# Patient Record
Sex: Male | Born: 1980 | Race: White | Hispanic: No | Marital: Married | Smoking: Never smoker
Health system: Southern US, Community
[De-identification: ages and names within clinical notes are randomized; demographics above are authoritative.]

## PROBLEM LIST (undated history)

## (undated) DIAGNOSIS — E559 Vitamin D deficiency, unspecified: Secondary | ICD-10-CM

## (undated) DIAGNOSIS — F988 Other specified behavioral and emotional disorders with onset usually occurring in childhood and adolescence: Secondary | ICD-10-CM

## (undated) DIAGNOSIS — F32A Depression, unspecified: Secondary | ICD-10-CM

## (undated) DIAGNOSIS — F419 Anxiety disorder, unspecified: Secondary | ICD-10-CM

## (undated) DIAGNOSIS — I10 Essential (primary) hypertension: Secondary | ICD-10-CM

## (undated) DIAGNOSIS — T7840XA Allergy, unspecified, initial encounter: Secondary | ICD-10-CM

## (undated) DIAGNOSIS — G4730 Sleep apnea, unspecified: Secondary | ICD-10-CM

## (undated) DIAGNOSIS — F329 Major depressive disorder, single episode, unspecified: Secondary | ICD-10-CM

## (undated) DIAGNOSIS — E88819 Insulin resistance, unspecified: Secondary | ICD-10-CM

## (undated) DIAGNOSIS — E8881 Metabolic syndrome: Secondary | ICD-10-CM

## (undated) DIAGNOSIS — E785 Hyperlipidemia, unspecified: Secondary | ICD-10-CM

## (undated) DIAGNOSIS — J45909 Unspecified asthma, uncomplicated: Secondary | ICD-10-CM

## (undated) HISTORY — DX: Sleep apnea, unspecified: G47.30

## (undated) HISTORY — PX: OPEN REDUCTION INTERNAL FIXATION (ORIF) METACARPAL: SHX6234

## (undated) HISTORY — PX: PALATOPLASTY: SHX2153

## (undated) HISTORY — DX: Hyperlipidemia, unspecified: E78.5

## (undated) HISTORY — DX: Essential (primary) hypertension: I10

## (undated) HISTORY — PX: TONSILLECTOMY AND ADENOIDECTOMY: SUR1326

## (undated) HISTORY — DX: Allergy, unspecified, initial encounter: T78.40XA

## (undated) HISTORY — PX: ORIF HUMERUS FRACTURE: SHX2126

## (undated) HISTORY — DX: Insulin resistance, unspecified: E88.819

## (undated) HISTORY — DX: Vitamin D deficiency, unspecified: E55.9

## (undated) HISTORY — PX: OTHER SURGICAL HISTORY: SHX169

## (undated) HISTORY — DX: Major depressive disorder, single episode, unspecified: F32.9

## (undated) HISTORY — DX: Unspecified asthma, uncomplicated: J45.909

## (undated) HISTORY — DX: Other specified behavioral and emotional disorders with onset usually occurring in childhood and adolescence: F98.8

## (undated) HISTORY — DX: Depression, unspecified: F32.A

## (undated) HISTORY — DX: Anxiety disorder, unspecified: F41.9

## (undated) HISTORY — DX: Metabolic syndrome: E88.81

---
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
## 2001-04-25 HISTORY — PX: HERNIA REPAIR: SHX51

## 2001-04-25 HISTORY — PX: INGUINAL HERNIA REPAIR: SHX194

## 2007-08-30 ENCOUNTER — Encounter: Admission: RE | Admit: 2007-08-30 | Discharge: 2007-08-30 | Payer: Self-pay | Admitting: Family Medicine

## 2007-09-14 ENCOUNTER — Encounter: Admission: RE | Admit: 2007-09-14 | Discharge: 2007-09-14 | Payer: Self-pay | Admitting: Gastroenterology

## 2008-01-04 ENCOUNTER — Ambulatory Visit (HOSPITAL_BASED_OUTPATIENT_CLINIC_OR_DEPARTMENT_OTHER): Admission: RE | Admit: 2008-01-04 | Discharge: 2008-01-04 | Payer: Self-pay | Admitting: *Deleted

## 2008-06-06 ENCOUNTER — Ambulatory Visit (HOSPITAL_COMMUNITY): Admission: RE | Admit: 2008-06-06 | Discharge: 2008-06-06 | Payer: Self-pay | Admitting: Family Medicine

## 2010-05-16 ENCOUNTER — Encounter: Payer: Self-pay | Admitting: Family Medicine

## 2010-09-07 NOTE — Op Note (Signed)
NAMEDEMANI, MCBRIEN NO.:  [id number]   MEDICAL RECORD NO.:  [id number]          PATIENT TYPE:  AMB   LOCATION:  DSC                          FACILITY:  [facility]   PHYSICIAN:  Tennis Must Meyerdierks, M.D.DATE OF BIRTH:  March 23, 1981   DATE OF PROCEDURE:  01/04/2008  DATE OF DISCHARGE:                               OPERATIVE REPORT   PREOPERATIVE DIAGNOSIS:  Fracture right first metacarpal proximal third.   POSTOPERATIVE DIAGNOSIS:  Fracture right first metacarpal proximal  third.   PROCEDURE:  Closed reduction and percutaneous pinning fracture right  first metacarpal.   SURGEON:  Lowell Bouton, MD   ANESTHESIA:  General.   OPERATIVE FINDINGS:  The patient had an oblique fracture of the first  metacarpal that was closed in the proximal third.  It was volarly  angulated.   DESCRIPTION OF PROCEDURE:  Under general anesthesia, the right hand was  prepped and draped in the usual fashion and under fluoroscopic control,  a closed reduction was performed of the first metacarpal.  A 0.045 K-  wire was placed obliquely across the fracture and x-rays showed good  alignment.  A second 0.045 K-wire was then placed through the dorsum of  the MP joint longitudinally across the fracture site to the [facility] joint.  X-rays showed good position of the fracture and fluoroscopically there  was no motion at the fracture site.  The pins were bent over and left  protruding from the skin.  Sterile dressings were applied followed by a  thumb spica splint.  Marcaine 0.50% was inserted for pain control.  The  patient tolerated the procedure well and went to the recovery room  awake, stable and good condition.      Lowell Bouton, M.D.  Electronically Signed     EMM/MEDQ  D:  01/04/2008  T:  01/05/2008  Job:  [id number]

## 2011-01-26 LAB — POCT HEMOGLOBIN-HEMACUE: Hemoglobin: 16.4

## 2013-03-14 ENCOUNTER — Encounter: Payer: Self-pay | Admitting: Physician Assistant

## 2013-03-14 DIAGNOSIS — F324 Major depressive disorder, single episode, in partial remission: Secondary | ICD-10-CM | POA: Insufficient documentation

## 2013-03-14 DIAGNOSIS — J45909 Unspecified asthma, uncomplicated: Secondary | ICD-10-CM | POA: Insufficient documentation

## 2013-03-14 DIAGNOSIS — E559 Vitamin D deficiency, unspecified: Secondary | ICD-10-CM | POA: Insufficient documentation

## 2013-03-14 DIAGNOSIS — R0989 Other specified symptoms and signs involving the circulatory and respiratory systems: Secondary | ICD-10-CM | POA: Insufficient documentation

## 2013-03-14 DIAGNOSIS — J4520 Mild intermittent asthma, uncomplicated: Secondary | ICD-10-CM | POA: Insufficient documentation

## 2013-03-14 DIAGNOSIS — E785 Hyperlipidemia, unspecified: Secondary | ICD-10-CM

## 2013-03-14 DIAGNOSIS — F329 Major depressive disorder, single episode, unspecified: Secondary | ICD-10-CM

## 2013-03-14 DIAGNOSIS — F419 Anxiety disorder, unspecified: Secondary | ICD-10-CM

## 2013-03-14 DIAGNOSIS — E8881 Metabolic syndrome: Secondary | ICD-10-CM

## 2013-03-14 DIAGNOSIS — I10 Essential (primary) hypertension: Secondary | ICD-10-CM

## 2013-03-14 DIAGNOSIS — E782 Mixed hyperlipidemia: Secondary | ICD-10-CM | POA: Insufficient documentation

## 2013-03-14 DIAGNOSIS — F988 Other specified behavioral and emotional disorders with onset usually occurring in childhood and adolescence: Secondary | ICD-10-CM | POA: Insufficient documentation

## 2013-03-15 ENCOUNTER — Ambulatory Visit: Payer: Self-pay | Admitting: Physician Assistant

## 2013-04-03 ENCOUNTER — Other Ambulatory Visit: Payer: Self-pay | Admitting: Internal Medicine

## 2013-04-03 MED ORDER — AMPHETAMINE-DEXTROAMPHETAMINE 20 MG PO TABS: ORAL_TABLET | ORAL | Status: DC

## 2013-06-13 ENCOUNTER — Encounter: Payer: Self-pay | Admitting: Internal Medicine

## 2013-06-13 ENCOUNTER — Ambulatory Visit (INDEPENDENT_AMBULATORY_CARE_PROVIDER_SITE_OTHER): Payer: BC Managed Care – PPO | Admitting: Internal Medicine

## 2013-06-13 VITALS — BP 130/86 | HR 60 | Temp 97.9°F | Resp 16 | Ht 69.75 in | Wt 215.4 lb

## 2013-06-13 DIAGNOSIS — Z79899 Other long term (current) drug therapy: Secondary | ICD-10-CM

## 2013-06-13 DIAGNOSIS — E8881 Metabolic syndrome: Secondary | ICD-10-CM

## 2013-06-13 DIAGNOSIS — E785 Hyperlipidemia, unspecified: Secondary | ICD-10-CM

## 2013-06-13 DIAGNOSIS — Z23 Encounter for immunization: Secondary | ICD-10-CM

## 2013-06-13 DIAGNOSIS — Z113 Encounter for screening for infections with a predominantly sexual mode of transmission: Secondary | ICD-10-CM

## 2013-06-13 DIAGNOSIS — Z0000 Encounter for general adult medical examination without abnormal findings: Secondary | ICD-10-CM

## 2013-06-13 DIAGNOSIS — Z1212 Encounter for screening for malignant neoplasm of rectum: Secondary | ICD-10-CM

## 2013-06-13 DIAGNOSIS — R7402 Elevation of levels of lactic acid dehydrogenase (LDH): Secondary | ICD-10-CM

## 2013-06-13 DIAGNOSIS — R740 Nonspecific elevation of levels of transaminase and lactic acid dehydrogenase [LDH]: Secondary | ICD-10-CM

## 2013-06-13 DIAGNOSIS — Z111 Encounter for screening for respiratory tuberculosis: Secondary | ICD-10-CM

## 2013-06-13 DIAGNOSIS — I10 Essential (primary) hypertension: Secondary | ICD-10-CM

## 2013-06-13 DIAGNOSIS — E559 Vitamin D deficiency, unspecified: Secondary | ICD-10-CM

## 2013-06-13 LAB — CBC WITH DIFFERENTIAL/PLATELET
Basophils Absolute: 0 10*3/uL (ref 0.0–0.1)
Basophils Relative: 0 % (ref 0–1)
EOS ABS: 0.3 10*3/uL (ref 0.0–0.7)
EOS PCT: 6 % — AB (ref 0–5)
HEMATOCRIT: 42 % (ref 39.0–52.0)
Hemoglobin: 14.3 g/dL (ref 13.0–17.0)
LYMPHS ABS: 2.3 10*3/uL (ref 0.7–4.0)
LYMPHS PCT: 39 % (ref 12–46)
MCH: 28.8 pg (ref 26.0–34.0)
MCHC: 34 g/dL (ref 30.0–36.0)
MCV: 84.5 fL (ref 78.0–100.0)
MONO ABS: 0.4 10*3/uL (ref 0.1–1.0)
Monocytes Relative: 7 % (ref 3–12)
Neutro Abs: 2.8 10*3/uL (ref 1.7–7.7)
Neutrophils Relative %: 48 % (ref 43–77)
Platelets: 301 10*3/uL (ref 150–400)
RBC: 4.97 MIL/uL (ref 4.22–5.81)
RDW: 13 % (ref 11.5–15.5)
WBC: 5.8 10*3/uL (ref 4.0–10.5)

## 2013-06-13 NOTE — Progress Notes (Signed)
Patient ID: Cole Lozano, male   DOB: [birth year], 33 y.o.   MRN: [id number]   Annual Screening Comprehensive Examination  This very nice 32 y.o.  MWM presents for complete physical.  Patient has been followed for Labile elevated BP, Diabetes  Prediabetes, Hyperlipidemia, and Vitamin D Deficiency.   HTN predates since August 2014 with BP 140/97. Patient's BP has been controlled at home usu less than 120/70-80's. Today's BP: 130/86 mmHg. Patient denies any cardiac symptoms as chest pain, palpitations, shortness of breath, dizziness or ankle swelling.   [name] is controlled with diet and medications. Patient denies myalgias or other medication SE's. Last cholesterol last visit was 199, triglycerides 116, HDL 39  and LDL 137 elevated in Aug 2014.     Patient has prediabetes/insulin resistance with last A1c 5.2% / insulin elevated at 94 in Aug 2014.Marland Kitchen Since that time, he has lost about 10 #. Patient denies reactive hypoglycemic symptoms, visual blurring, diabetic polys, or paresthesias.    Patient has ADD and is currently on Wellbutrin (also for mood) and Adderall with reported improved concentration and productivity.   Finally, patient has history of Vitamin D Deficiency with last vitamin D 52 in Oct 2014. He also has Hx/o low testosterone 294 in August 2014.  Medication Sig Dispense Refill  . albuterol (PROVENTIL HFA;VENTOLIN HFA) 108 (90 BASE) MCG/ACT inhaler Inhale into the lungs every 6 (six) hours as needed for wheezing or shortness of breath.      . amphetamine-dextroamphetamine (ADDERALL) 20 MG tablet 1/2 to 1 tablet 2 or 3 x daily for ADD  90 tablet  0   No current facility-administered medications on file prior to visit.   No Known Allergies  Past Medical History  Diagnosis Date  . Hypertension, Labile   . Allergy   . Asthma   . Anxiety   . Depression   . Vitamin D deficiency   . ADD (attention deficit disorder)   . Hyperlipidemia   . Insulin resistance      history of normal A1C but insulin 60, 46    Past Surgical History  Procedure Laterality Date  . Elbow surgery Left 1991    s/p trauma  . Tonsillectomy and adenoidectomy    . Hernia repair Right 2003    inguinal    Family History  Problem Relation Age of Onset  . Cancer Mother     skin  . Neurofibromatosis Brother   . Diabetes Other   . Stroke Other     History   Social History  . Marital Status: Single    Spouse Name: N/A    Number of Children: N/A  . Years of Education: N/A   Occupational History  . Not on file.   Social History Main Topics  . Smoking status: Never Smoker   . Smokeless tobacco: Never Used  . Alcohol Use: 0.5 oz/week    1 drink(s) per week  . Drug Use: No  . Sexual Activity: Yes    ROS Constitutional: Denies fever, chills, weight loss/gain, headaches, insomnia, fatigue, night sweats, and change in appetite. Eyes: Denies redness, blurred vision, diplopia, discharge, itchy, watery eyes.  ENT: Denies discharge, congestion, post nasal drip, epistaxis, sore throat, earache, hearing loss, dental pain, Tinnitus, Vertigo, Sinus pain, snoring.  Cardio: Denies chest pain, palpitations, irregular heartbeat, syncope, dyspnea, diaphoresis, orthopnea, PND, claudication, edema Respiratory: denies cough, dyspnea, DOE, pleurisy, hoarseness, laryngitis, wheezing.  Gastrointestinal: Denies dysphagia, heartburn, reflux, water brash, pain, cramps, nausea, vomiting, bloating, diarrhea, constipation, hematemesis, melena,  hematochezia, jaundice, hemorrhoids Genitourinary: Denies dysuria, frequency, urgency, nocturia, hesitancy, discharge, hematuria, flank pain Musculoskeletal: Denies arthralgia, myalgia, stiffness, Jt. Swelling, pain, limp, and strain/sprain. Skin: Denies puritis, rash, hives, warts, acne, eczema, changing in skin lesion Neuro: No weakness, tremor, incoordination, spasms, paresthesia, pain Psychiatric: Denies confusion, memory loss, sensory  loss Endocrine: Denies change in weight, skin, hair change, nocturia, and paresthesia, diabetic polys, visual blurring, hyper / hypo glycemic episodes.  Heme/Lymph: No excessive bleeding, bruising, or elarged lymph nodes.  BP: 130/86  Pulse: 60  Temp: 97.9 F (36.6 C)  Resp: 16    Estimated body mass index is 31.12 kg/(m^2) as calculated from the following:   Height as of this encounter: 5' 9.75" (1.772 m).   Weight as of this encounter: 215 lb 6.4 oz (97.705 kg).  Physical Exam General Appearance: Well nourished, in no apparent distress. Eyes: PERRLA, EOMs, conjunctiva no swelling or erythema, normal fundi and vessels. Sinuses: No frontal/maxillary tenderness ENT/Mouth: EACs patent / TMs  nl. Nares clear without erythema, swelling, mucoid exudates. Oral hygiene is good. No erythema, swelling, or exudate. Tongue normal, non-obstructing. Tonsils not swollen or erythematous. Hearing normal.  Neck: Supple, thyroid normal. No bruits, nodes or JVD. Respiratory: Respiratory effort normal.  BS equal and clear bilateral without rales, rhonci, wheezing or stridor. Cardio: Heart sounds are normal with regular rate and rhythm and no murmurs, rubs or gallops. Peripheral pulses are normal and equal bilaterally without edema. No aortic or femoral bruits. Chest: symmetric with normal excursions and percussion.  Abdomen: Flat, soft, with bowl sounds. Nontender, no guarding, rebound, hernias, masses, or organomegaly.  Lymphatics: Non tender without lymphadenopathy.  Genitourinary: No hernias.Testes nl.  Musculoskeletal: Full ROM all peripheral extremities, joint stability, 5/5 strength, and normal gait. Skin: Warm and dry without rashes, lesions, cyanosis, clubbing or  ecchymosis.  Neuro: Cranial nerves intact, reflexes equal bilaterally. Normal muscle tone, no cerebellar symptoms. Sensation intact.  Pysch: Awake and oriented X 3, normal affect, insight and judgment appropriate.   Assessment and  Plan  1. Annual Screening Examination 2. Hypertension, labile  3. Hyperlipidemia 4. Pre Diabetes / Insulin Resistance 5. Vitamin D Deficiency 6. ADD  Continue prudent diet as discussed, weight control, BP monitoring, regular exercise, and medications as discussed.  Discussed med effects and SE's. Routine screening labs and tests as requested with regular follow-up as recommended.

## 2013-06-13 NOTE — Patient Instructions (Signed)

## 2013-06-14 LAB — VITAMIN B12: VITAMIN B 12: 779 pg/mL (ref 211–911)

## 2013-06-14 LAB — TESTOSTERONE: Testosterone: 441 ng/dL (ref 300–890)

## 2013-06-14 LAB — URINALYSIS, MICROSCOPIC ONLY
BACTERIA UA: NONE SEEN
CRYSTALS: NONE SEEN
Casts: NONE SEEN
Squamous Epithelial / LPF: NONE SEEN

## 2013-06-14 LAB — HEPATIC FUNCTION PANEL
ALBUMIN: 5.3 g/dL — AB (ref 3.5–5.2)
ALK PHOS: 54 U/L (ref 39–117)
ALT: 26 U/L (ref 0–53)
AST: 25 U/L (ref 0–37)
Bilirubin, Direct: 0.1 mg/dL (ref 0.0–0.3)
Indirect Bilirubin: 0.6 mg/dL (ref 0.2–1.2)
TOTAL PROTEIN: 7.6 g/dL (ref 6.0–8.3)
Total Bilirubin: 0.7 mg/dL (ref 0.2–1.2)

## 2013-06-14 LAB — VITAMIN D 25 HYDROXY (VIT D DEFICIENCY, FRACTURES): Vit D, 25-Hydroxy: 92 ng/mL — ABNORMAL HIGH (ref 30–89)

## 2013-06-14 LAB — LIPID PANEL
Cholesterol: 219 mg/dL — ABNORMAL HIGH (ref 0–200)
HDL: 39 mg/dL — AB (ref >39)
LDL CALC: 152 mg/dL — AB (ref 0–99)
Total CHOL/HDL Ratio: 5.6 Ratio
Triglycerides: 142 mg/dL (ref <150)
VLDL: 28 mg/dL (ref 0–40)

## 2013-06-14 LAB — MICROALBUMIN / CREATININE URINE RATIO
Creatinine, Urine: 39.3 mg/dL
MICROALB UR: 0.5 mg/dL (ref 0.00–1.89)
MICROALB/CREAT RATIO: 12.7 mg/g (ref 0.0–30.0)

## 2013-06-14 LAB — BASIC METABOLIC PANEL WITH GFR
BUN: 8 mg/dL (ref 6–23)
CALCIUM: 10.2 mg/dL (ref 8.4–10.5)
CHLORIDE: 101 meq/L (ref 96–112)
CO2: 25 meq/L (ref 19–32)
CREATININE: 0.83 mg/dL (ref 0.50–1.35)
GFR, Est African American: >89 mL/min
GFR, Est Non African American: >89 mL/min
Glucose, Bld: 83 mg/dL (ref 70–99)
Potassium: 4.3 mEq/L (ref 3.5–5.3)
Sodium: 140 mEq/L (ref 135–145)

## 2013-06-14 LAB — TSH: TSH: 1.843 u[IU]/mL (ref 0.350–4.500)

## 2013-06-14 LAB — RPR

## 2013-06-14 LAB — HEMOGLOBIN A1C
HEMOGLOBIN A1C: 5.3 % (ref <5.7)
MEAN PLASMA GLUCOSE: 105 mg/dL (ref <117)

## 2013-06-14 LAB — MAGNESIUM: Magnesium: 1.9 mg/dL (ref 1.5–2.5)

## 2013-06-14 LAB — HEPATITIS B SURFACE ANTIBODY,QUALITATIVE: Hep B S Ab: POSITIVE — AB

## 2013-06-14 LAB — HEPATITIS C ANTIBODY: HCV AB: NEGATIVE

## 2013-06-14 LAB — HEPATITIS A ANTIBODY, TOTAL: Hep A Total Ab: NONREACTIVE

## 2013-06-14 LAB — HIV ANTIBODY (ROUTINE TESTING W REFLEX): HIV: NONREACTIVE

## 2013-06-14 LAB — HEPATITIS B CORE ANTIBODY, TOTAL: HEP B C TOTAL AB: NONREACTIVE

## 2013-06-14 LAB — INSULIN, FASTING: Insulin fasting, serum: 12 u[IU]/mL (ref 3–28)

## 2013-06-16 ENCOUNTER — Other Ambulatory Visit: Payer: Self-pay | Admitting: Physician Assistant

## 2013-06-17 LAB — HEPATITIS B E ANTIBODY: Hepatitis Be Antibody: NEGATIVE

## 2013-06-17 LAB — TB SKIN TEST
INDURATION: 0 mm
TB SKIN TEST: NEGATIVE

## 2013-06-27 ENCOUNTER — Encounter: Payer: Self-pay | Admitting: Physician Assistant

## 2013-06-27 ENCOUNTER — Ambulatory Visit (INDEPENDENT_AMBULATORY_CARE_PROVIDER_SITE_OTHER): Payer: BC Managed Care – PPO | Admitting: Physician Assistant

## 2013-06-27 VITALS — BP 122/72 | HR 76 | Temp 98.2°F | Resp 16 | Wt 216.0 lb

## 2013-06-27 DIAGNOSIS — J309 Allergic rhinitis, unspecified: Secondary | ICD-10-CM

## 2013-06-27 DIAGNOSIS — F988 Other specified behavioral and emotional disorders with onset usually occurring in childhood and adolescence: Secondary | ICD-10-CM

## 2013-06-27 MED ORDER — AMPHETAMINE-DEXTROAMPHETAMINE 20 MG PO TABS: ORAL_TABLET | ORAL | Status: DC

## 2013-06-27 MED ORDER — AZITHROMYCIN 250 MG PO TABS: ORAL_TABLET | ORAL | Status: DC

## 2013-06-27 MED ORDER — PREDNISONE 20 MG PO TABS: ORAL_TABLET | ORAL | Status: DC

## 2013-06-27 NOTE — Patient Instructions (Signed)

## 2013-06-27 NOTE — Progress Notes (Signed)
   Subjective:    Patient ID: Cole FossaStephen Dearman, male    DOB: [birth year], 33 y.o.   MRN: [id number]  Sore Throat  This is a new problem. The current episode started yesterday. The problem has been gradually worsening. There has been no fever. Associated symptoms include congestion and coughing. Pertinent negatives include no abdominal pain, diarrhea, drooling, ear discharge, ear pain, headaches, hoarse voice, plugged ear sensation, neck pain, shortness of breath, stridor, swollen glands, trouble swallowing or vomiting. He has tried nothing for the symptoms.      Review of Systems  Constitutional: Negative.   HENT: Positive for congestion, sinus pressure and sore throat. Negative for drooling, ear discharge, ear pain, hoarse voice, nosebleeds, sneezing, tinnitus and trouble swallowing.   Eyes: Negative.   Respiratory: Positive for cough. Negative for shortness of breath and stridor.   Cardiovascular: Negative.   Gastrointestinal: Negative.  Negative for vomiting, abdominal pain and diarrhea.  Genitourinary: Negative.   Musculoskeletal: Negative.  Negative for neck pain.  Neurological: Negative.  Negative for headaches.       Objective:   Physical Exam  Constitutional: He appears well-developed and well-nourished.  HENT:  Head: Normocephalic and atraumatic.  Right Ear: External ear normal.  Left Ear: External ear normal.  Mouth/Throat: Uvula is midline and mucous membranes are normal. Posterior oropharyngeal edema and posterior oropharyngeal erythema present.  Eyes: Conjunctivae and EOM are normal. Pupils are equal, round, and reactive to light.  Neck: Normal range of motion. Neck supple.  Cardiovascular: Normal rate, regular rhythm and normal heart sounds.   Pulmonary/Chest: Effort normal and breath sounds normal.  Abdominal: Soft. Bowel sounds are normal.  Lymphadenopathy:    He has no cervical adenopathy.  Skin: Skin is warm and dry.       Assessment & Plan:  ADD (attention  deficit disorder)- refill med, continue same  Allergic rhinitis- Allegra OTC, increase H20, allergy hygiene explained, hold off on taking Zpak, can take prednisone

## 2013-09-10 ENCOUNTER — Other Ambulatory Visit: Payer: Self-pay | Admitting: Physician Assistant

## 2013-09-10 MED ORDER — AMPHETAMINE-DEXTROAMPHETAMINE 20 MG PO TABS: ORAL_TABLET | ORAL | Status: DC

## 2013-09-23 ENCOUNTER — Telehealth: Payer: Self-pay | Admitting: *Deleted

## 2013-09-23 MED ORDER — AZITHROMYCIN 250 MG PO TABS: ORAL_TABLET | ORAL | Status: DC

## 2013-09-23 NOTE — Telephone Encounter (Signed)
Patient called and states he has a new baby. He states he has developed a sore throat and sinus drainage and asked for RX.  RX for Z-pak sent to [facility] on [address] per Dr Oneta Rack.  Patient aware.

## 2013-10-21 ENCOUNTER — Other Ambulatory Visit: Payer: Self-pay | Admitting: Emergency Medicine

## 2013-10-21 MED ORDER — BUPROPION HCL ER (XL) 150 MG PO TB24: ORAL_TABLET | ORAL | Status: DC

## 2013-11-27 ENCOUNTER — Other Ambulatory Visit: Payer: Self-pay | Admitting: Internal Medicine

## 2013-11-27 MED ORDER — AMPHETAMINE-DEXTROAMPHETAMINE 20 MG PO TABS: ORAL_TABLET | ORAL | Status: DC

## 2013-12-16 ENCOUNTER — Ambulatory Visit: Payer: Self-pay | Admitting: Physician Assistant

## 2013-12-23 ENCOUNTER — Encounter: Payer: Self-pay | Admitting: Physician Assistant

## 2013-12-23 ENCOUNTER — Ambulatory Visit (INDEPENDENT_AMBULATORY_CARE_PROVIDER_SITE_OTHER): Payer: BC Managed Care – PPO | Admitting: Physician Assistant

## 2013-12-23 VITALS — BP 122/82 | HR 72 | Temp 97.7°F | Resp 16 | Ht 70.0 in | Wt 200.0 lb

## 2013-12-23 DIAGNOSIS — J4520 Mild intermittent asthma, uncomplicated: Secondary | ICD-10-CM

## 2013-12-23 DIAGNOSIS — F419 Anxiety disorder, unspecified: Secondary | ICD-10-CM

## 2013-12-23 DIAGNOSIS — J45909 Unspecified asthma, uncomplicated: Secondary | ICD-10-CM

## 2013-12-23 DIAGNOSIS — I10 Essential (primary) hypertension: Secondary | ICD-10-CM

## 2013-12-23 DIAGNOSIS — E785 Hyperlipidemia, unspecified: Secondary | ICD-10-CM

## 2013-12-23 DIAGNOSIS — F3289 Other specified depressive episodes: Secondary | ICD-10-CM

## 2013-12-23 DIAGNOSIS — F411 Generalized anxiety disorder: Secondary | ICD-10-CM

## 2013-12-23 DIAGNOSIS — F32A Depression, unspecified: Secondary | ICD-10-CM

## 2013-12-23 DIAGNOSIS — E8881 Metabolic syndrome: Secondary | ICD-10-CM

## 2013-12-23 DIAGNOSIS — Z79899 Other long term (current) drug therapy: Secondary | ICD-10-CM

## 2013-12-23 DIAGNOSIS — E559 Vitamin D deficiency, unspecified: Secondary | ICD-10-CM

## 2013-12-23 DIAGNOSIS — F329 Major depressive disorder, single episode, unspecified: Secondary | ICD-10-CM

## 2013-12-23 NOTE — Patient Instructions (Signed)

## 2013-12-23 NOTE — Progress Notes (Signed)
Assessment and Plan:  Hypertension: Continue medication, monitor blood pressure at home. Continue DASH diet. Cholesterol: Continue diet and exercise. Check cholesterol.  Pre-diabetes-Continue diet and exercise. Check A1C Vitamin D Def- check level and continue medications.  ADD-  Continue ADD medication, helps with focus, no AE's. The patient was counseled on the addictive nature of the medication and was encouraged to take drug holidays when not needed.    Continue diet and meds as discussed. Further disposition pending results of labs.  HPI 33 y.o. male  presents for 3 month follow up with hypertension, hyperlipidemia, prediabetes and vitamin D. His blood pressure has been controlled at home, today their BP is BP: 122/82 mmHg He does workout. He denies chest pain, shortness of breath, dizziness.  He is not on cholesterol medication and denies myalgias. His cholesterol is not at goal. The cholesterol last visit was:   Lab Results  Component Value Date   CHOL 219* 06/13/2013   HDL 39* 06/13/2013   LDLCALC 152* 06/13/2013   TRIG 142 06/13/2013   CHOLHDL 5.6 06/13/2013   He has been working on diet and exercise for prediabetes/insuling resistance, and denies polydipsia, polyuria and visual disturbances. Last A1C in the office was:  Lab Results  Component Value Date   HGBA1C 5.3 06/13/2013   Patient is on Vitamin D supplement.   Lab Results  Component Value Date   VD25OH 1* 06/13/2013     12 month old son, Egbert Garibaldi.  BMI is Body mass index is 28.7 kg/(m^2)., He is working on diet and exercise and has done well. He states his wifes does not think he has sleep apnea, his BP has been better, decreased stress.  Wt Readings from Last 3 Encounters:  12/23/13 200 lb (90.719 kg)  06/27/13 216 lb (97.977 kg)  06/13/13 215 lb 6.4 oz (97.705 kg)   Patient is on an ADD medication, he states that the medication is helping and he denies any adverse reactions, very rarely he will take 3 times a  day depending on his work.   Current Medications:  Current Outpatient Prescriptions on File Prior to Visit  Medication Sig Dispense Refill  . albuterol (PROVENTIL HFA;VENTOLIN HFA) 108 (90 BASE) MCG/ACT inhaler Inhale into the lungs every 6 (six) hours as needed for wheezing or shortness of breath.      . amphetamine-dextroamphetamine (ADDERALL) 20 MG tablet 1/2 to 1 tablet 2 or 3 x daily for ADD  90 tablet  0  . aspirin 81 MG tablet Take 81 mg by mouth daily.      Marland Kitchen buPROPion (WELLBUTRIN XL) 150 MG 24 hr tablet take 1 tablet by mouth once daily  30 tablet  3  . Cholecalciferol (VITAMIN D PO) Take 5,000 Units by mouth daily.      . Glucosamine-Chondroitin (GLUCOSAMINE CHONDR COMPLEX PO) Take by mouth 2 (two) times daily.      . multivitamin (ONE-A-DAY MEN'S) TABS tablet Take 1 tablet by mouth daily.      Marland Kitchen OVER THE COUNTER MEDICATION Allergy tab daily       No current facility-administered medications on file prior to visit.   Medical History:  Past Medical History  Diagnosis Date  . Hypertension   . Allergy   . Asthma   . Anxiety   . Depression   . Vitamin D deficiency   . ADD (attention deficit disorder)   . Hyperlipidemia   . Insulin resistance     history of normal A1C but insulin 60,  45   Allergies: No Known Allergies   Review of Systems:  = complains of   = denies  General: Fatigue  Fever  Chills  Weakness   Insomnia  Eyes: Redness  Blurred vision  Diplopia   ENT: Congestion  Sinus Pain  Post Nasal Drip  Sore Throat  Earache   Cardiac: Chest pain/pressure  SOB  Orthopnea   Palpitations   Paroxysmal nocturnal dyspnea[ ]  Claudication  Edema   Pulmonary: Cough  Wheezing[ ]   SOB   Snoring   GI: Nausea  Vomiting[ ]  Dysphagia[ ]  Heartburn[ ]  Abdominal pain  Constipation ; Diarrhea ; BRBPR  Melena[ ]  GU: Hematuria[ ]  Dysuria  Nocturia[ ]  Urgency   Hesitancy  Discharge  Neuro:  Headaches[ ]  Vertigo[ ]  Paresthesias[ ]  Spasm  Speech changes  Incoordination   Ortho: Arthritis  Joint pain  Muscle pain  Joint swelling  Back Pain  Skin:  Rash   Pruritis  Change in skin lesion   Psych: Depression[ ]  Anxiety[ ]  Confusion  Memory loss   Heme/Lypmh: Bleeding  Bruising  Enlarged lymph nodes   Endocrine: Visual blurring  Paresthesia  Polyuria  Polydypsea    Heat/cold intolerance  Hypoglycemia   Family history- Review and unchanged Social history- Review and unchanged Physical Exam: BP 122/82  Pulse 72  Temp(Src) 97.7 F (36.5 C)  Resp 16  Ht  (1.778 m)  Wt 200 lb (90.719 kg)  BMI 28.70 kg/m2 Wt Readings from Last 3 Encounters:  12/23/13 200 lb (90.719 kg)  06/27/13 216 lb (97.977 kg)  06/13/13 215 lb 6.4 oz (97.705 kg)   General Appearance: Well nourished, in no apparent distress. Eyes: PERRLA, EOMs, conjunctiva no swelling or erythema Sinuses: No Frontal/maxillary tenderness ENT/Mouth: Ext aud canals clear, TMs without erythema, bulging. No erythema, swelling, or exudate on post pharynx.  Tonsils not swollen or erythematous. Hearing normal.  Neck: Supple, thyroid normal.  Respiratory: Respiratory effort normal, BS equal bilaterally without rales, rhonchi, wheezing or stridor.  Cardio: RRR with no MRGs. Brisk peripheral pulses without edema.  Abdomen: Soft, + BS.  Non tender, no guarding, rebound, hernias, masses. Lymphatics: Non tender without lymphadenopathy.  Musculoskeletal: Full ROM, 5/5 strength, normal gait.  Skin: Warm, dry without rashes, lesions, ecchymosis.  Neuro: Cranial nerves intact. Normal muscle tone, no cerebellar symptoms. Sensation intact.  Psych: Awake and oriented X 3, normal affect, Insight and Judgment appropriate.    [name] 4:51 PM

## 2013-12-24 LAB — HEPATIC FUNCTION PANEL
ALBUMIN: 4.8 g/dL (ref 3.5–5.2)
ALT: 25 U/L (ref 0–53)
AST: 23 U/L (ref 0–37)
Alkaline Phosphatase: 59 U/L (ref 39–117)
Bilirubin, Direct: 0.1 mg/dL (ref 0.0–0.3)
Indirect Bilirubin: 0.3 mg/dL (ref 0.2–1.2)
Total Bilirubin: 0.4 mg/dL (ref 0.2–1.2)
Total Protein: 7.4 g/dL (ref 6.0–8.3)

## 2013-12-24 LAB — CBC WITH DIFFERENTIAL/PLATELET
BASOS ABS: 0.1 10*3/uL (ref 0.0–0.1)
Basophils Relative: 1 % (ref 0–1)
Eosinophils Absolute: 0.4 10*3/uL (ref 0.0–0.7)
Eosinophils Relative: 5 % (ref 0–5)
HEMATOCRIT: 41 % (ref 39.0–52.0)
Hemoglobin: 14.3 g/dL (ref 13.0–17.0)
LYMPHS PCT: 32 % (ref 12–46)
Lymphs Abs: 2.4 10*3/uL (ref 0.7–4.0)
MCH: 29.1 pg (ref 26.0–34.0)
MCHC: 34.9 g/dL (ref 30.0–36.0)
MCV: 83.3 fL (ref 78.0–100.0)
Monocytes Absolute: 0.4 10*3/uL (ref 0.1–1.0)
Monocytes Relative: 6 % (ref 3–12)
NEUTROS ABS: 4.1 10*3/uL (ref 1.7–7.7)
NEUTROS PCT: 56 % (ref 43–77)
Platelets: 313 10*3/uL (ref 150–400)
RBC: 4.92 MIL/uL (ref 4.22–5.81)
RDW: 13 % (ref 11.5–15.5)
WBC: 7.4 10*3/uL (ref 4.0–10.5)

## 2013-12-24 LAB — BASIC METABOLIC PANEL WITH GFR
BUN: 10 mg/dL (ref 6–23)
CALCIUM: 9.9 mg/dL (ref 8.4–10.5)
CHLORIDE: 104 meq/L (ref 96–112)
CO2: 29 mEq/L (ref 19–32)
CREATININE: 0.82 mg/dL (ref 0.50–1.35)
GFR, Est African American: >89 mL/min
GFR, Est Non African American: >89 mL/min
GLUCOSE: 90 mg/dL (ref 70–99)
POTASSIUM: 5 meq/L (ref 3.5–5.3)
SODIUM: 140 meq/L (ref 135–145)

## 2013-12-24 LAB — INSULIN, FASTING: Insulin fasting, serum: 6.5 u[IU]/mL (ref 2.0–19.6)

## 2013-12-24 LAB — TSH: TSH: 2.595 u[IU]/mL (ref 0.350–4.500)

## 2013-12-24 LAB — LIPID PANEL
Cholesterol: 213 mg/dL — ABNORMAL HIGH (ref 0–200)
HDL: 53 mg/dL (ref >39)
LDL CALC: 139 mg/dL — AB (ref 0–99)
Total CHOL/HDL Ratio: 4 Ratio
Triglycerides: 105 mg/dL (ref <150)
VLDL: 21 mg/dL (ref 0–40)

## 2013-12-24 LAB — VITAMIN D 25 HYDROXY (VIT D DEFICIENCY, FRACTURES): VIT D 25 HYDROXY: 83 ng/mL (ref 30–89)

## 2013-12-24 LAB — MAGNESIUM: Magnesium: 1.9 mg/dL (ref 1.5–2.5)

## 2013-12-24 LAB — HEMOGLOBIN A1C
Hgb A1c MFr Bld: 5.6 % (ref <5.7)
Mean Plasma Glucose: 114 mg/dL (ref <117)

## 2014-02-12 ENCOUNTER — Telehealth: Payer: Self-pay | Admitting: *Deleted

## 2014-02-12 ENCOUNTER — Other Ambulatory Visit: Payer: Self-pay | Admitting: Internal Medicine

## 2014-02-12 MED ORDER — AMPHETAMINE-DEXTROAMPHETAMINE 20 MG PO TABS: ORAL_TABLET | ORAL | Status: DC

## 2014-02-12 NOTE — Telephone Encounter (Signed)
Left a message to inform patient his RX is ready to pick up.

## 2014-02-24 ENCOUNTER — Encounter: Payer: Self-pay | Admitting: Physician Assistant

## 2014-02-24 ENCOUNTER — Ambulatory Visit (INDEPENDENT_AMBULATORY_CARE_PROVIDER_SITE_OTHER): Payer: BC Managed Care – PPO | Admitting: Physician Assistant

## 2014-02-24 VITALS — BP 136/84 | HR 72 | Temp 98.6°F | Resp 18 | Ht 70.0 in | Wt 207.0 lb

## 2014-02-24 DIAGNOSIS — J209 Acute bronchitis, unspecified: Secondary | ICD-10-CM

## 2014-02-24 MED ORDER — BENZONATATE 100 MG PO CAPS: 200.0000 mg | ORAL_CAPSULE | Freq: Three times a day (TID) | ORAL | Status: DC | PRN

## 2014-02-24 MED ORDER — AZITHROMYCIN 250 MG PO TABS: ORAL_TABLET | ORAL | Status: DC

## 2014-02-24 MED ORDER — PREDNISONE 20 MG PO TABS
ORAL_TABLET | ORAL | Status: DC
Start: 2014-02-24 — End: 2014-03-03

## 2014-02-24 NOTE — Progress Notes (Signed)
Subjective:    Patient ID: Cole Lozano, male    DOB: [birth year], 33 y.o.   MRN: [id number]  Wheezing  This is a new problem. Episode onset: 4 days ago.  Patient came in due to 275 month old baby at home. Associated symptoms include coughing and a sore throat. Pertinent negatives include no abdominal pain, chest pain, chills, coryza, diarrhea, ear pain, fever, headaches, hemoptysis, neck pain, rash, rhinorrhea, shortness of breath, sputum production, swollen glands or vomiting. Associated symptoms comments: Chest congestion. Treatments tried: Mucinex DM Max strength. The treatment provided no relief. His past medical history is significant for asthma.  Cough Associated symptoms include a sore throat and wheezing. Pertinent negatives include no chest pain, chills, ear congestion, ear pain, fever, headaches, heartburn, hemoptysis, myalgias, nasal congestion, postnasal drip, rash, rhinorrhea, shortness of breath, sweats or weight loss. His past medical history is significant for asthma and environmental allergies.   Review of Systems  Constitutional: Positive for diaphoresis. Negative for fever, chills, weight loss, appetite change and fatigue.  HENT: Positive for sore throat. Negative for ear discharge, ear pain, postnasal drip, rhinorrhea, sinus pressure and trouble swallowing.   Eyes: Negative.   Respiratory: Positive for cough and wheezing. Negative for hemoptysis, sputum production, chest tightness and shortness of breath.   Cardiovascular: Negative.  Negative for chest pain.  Gastrointestinal: Negative.  Negative for heartburn, vomiting, abdominal pain and diarrhea.  Genitourinary: Negative.   Musculoskeletal: Negative.  Negative for myalgias and neck pain.  Skin: Negative.  Negative for rash.  Allergic/Immunologic: Positive for environmental allergies.  Neurological: Negative.  Negative for headaches.  Psychiatric/Behavioral: Negative.    Past Medical History  Diagnosis Date  .  Hypertension   . Allergy   . Asthma   . Anxiety   . Depression   . Vitamin D deficiency   . ADD (attention deficit disorder)   . Hyperlipidemia   . Insulin resistance     history of normal A1C but insulin 60, 45   Current Outpatient Prescriptions on File Prior to Visit  Medication Sig Dispense Refill  . albuterol (PROVENTIL HFA;VENTOLIN HFA) 108 (90 BASE) MCG/ACT inhaler Inhale into the lungs every 6 (six) hours as needed for wheezing or shortness of breath.    . amphetamine-dextroamphetamine (ADDERALL) 20 MG tablet 1/2 to 1 tablet 2 or 3 x daily for ADD 90 tablet 0  . aspirin 81 MG tablet Take 81 mg by mouth daily.    Marland Kitchen. buPROPion (WELLBUTRIN XL) 150 MG 24 hr tablet take 1 tablet by mouth once daily 30 tablet 3  . Cholecalciferol (VITAMIN D PO) Take 5,000 Units by mouth daily.    . Glucosamine-Chondroitin (GLUCOSAMINE CHONDR COMPLEX PO) Take by mouth 2 (two) times daily.    . multivitamin (ONE-A-DAY MEN'S) TABS tablet Take 1 tablet by mouth daily.    Marland Kitchen. OVER THE COUNTER MEDICATION Allergy tab daily     No current facility-administered medications on file prior to visit.   No Known Allergies     BP 136/84 mmHg  Pulse 72  Temp(Src) 98.6 F (37 C) (Oral)  Resp 18  Ht 5\' 10"  (1.778 m)  Wt 207 lb (93.895 kg)  BMI 29.70 kg/m2  SpO2 99% Objective:   Physical Exam  Constitutional: He is oriented to person, place, and time. He appears well-developed and well-nourished. He has a sickly appearance. No distress.  HENT:  Head: Normocephalic and atraumatic.  Right Ear: External ear and ear canal normal. No drainage, swelling or tenderness. Tympanic  membrane is bulging. Tympanic membrane is not injected, not scarred, not perforated, not erythematous and not retracted. A middle ear effusion is present.  Left Ear: External ear and ear canal normal. No drainage, swelling or tenderness. Tympanic membrane is bulging. Tympanic membrane is not injected, not scarred, not perforated, not erythematous  and not retracted. A middle ear effusion is present.  Nose: Nose normal. No mucosal edema, rhinorrhea or sinus tenderness. Right sinus exhibits no maxillary sinus tenderness and no frontal sinus tenderness. Left sinus exhibits no maxillary sinus tenderness and no frontal sinus tenderness.  Mouth/Throat: Uvula is midline and mucous membranes are normal. Mucous membranes are not pale and not dry. No uvula swelling. Posterior oropharyngeal erythema present. No oropharyngeal exudate, posterior oropharyngeal edema or tonsillar abscesses.  TMs bilaterally bulging without erythema or edema.  Clear fluid behind TMs bilaterally.  Mild posterior oropharyngeal erythema.  Eyes: Conjunctivae and lids are normal. Pupils are equal, round, and reactive to light. Right eye exhibits no discharge. Left eye exhibits no discharge. No scleral icterus.  Neck: Trachea normal and normal range of motion. Neck supple. No tracheal tenderness present. No tracheal deviation present.  Cardiovascular: Normal rate, regular rhythm, S1 normal, S2 normal, normal heart sounds, intact distal pulses and normal pulses.  Exam reveals no gallop, no distant heart sounds and no friction rub.   No murmur heard. Pulmonary/Chest: Effort normal and breath sounds normal. No accessory muscle usage or stridor. No tachypnea and no bradypnea. No respiratory distress. He has no decreased breath sounds. He has no wheezes. He has no rhonchi. He has no rales. He exhibits no tenderness.  Abdominal: Soft. Bowel sounds are normal. He exhibits no distension and no mass. There is no tenderness. There is no rebound and no guarding.  Musculoskeletal: Normal range of motion.  Lymphadenopathy:       Head (right side): No submental, no submandibular, no tonsillar, no preauricular, no posterior auricular and no occipital adenopathy present.       Head (left side): No submental, no submandibular, no tonsillar, no preauricular, no posterior auricular and no occipital  adenopathy present.    He has no cervical adenopathy.       Right: No supraclavicular adenopathy present.       Left: No supraclavicular adenopathy present.  Neurological: He is alert and oriented to person, place, and time. He has normal strength.  Skin: Skin is warm, dry and intact. No rash noted. He is not diaphoretic. No cyanosis. Nails show no clubbing.  Psychiatric: He has a normal mood and affect. His speech is normal and behavior is normal. Judgment and thought content normal. Cognition and memory are normal.  Vitals reviewed.     Assessment & Plan:  1. Acute bronchitis, unspecified organism Most likely viral bronchitis. Jovita Gamma-Gave Z-Pak prescription to take only if not feeling better and to start on 03/02/14- azithromycin (ZITHROMAX Z-PAK) 250 MG tablet; Take 2 tablets PO on day 1, then take 1 tablet PO QDaily for 4 days.  Dispense: 6 tablet; Refill: 0 -Take Tessalon perles as prescribed for cough- benzonatate (TESSALON PERLES) 100 MG capsule; Take 2 capsules (200 mg total) by mouth 3 (three) times daily as needed for cough.  Dispense: 120 capsule; Refill: 0 -Take prednisone as prescribed to help with inflammation- predniSONE (DELTASONE) 20 MG tablet; Take 3 tablets PO QDaily for 3 days, then take 2 tablets PO QDaily for 3 days, then take 1 tablet PO QDaily for 3 days  Dispense: 18 tablet; Refill: 0 -Be careful  of what you eat as prednisone can cause weight gain. -Drink plenty of fluids to thin out mucous and stay hydrated. -while drinking fluids, pinch and hold nose close and swallow to open up eustachian tubes. -Continue the Mucinex- to help thin out mucous and cough it up. -Continue Albuterol as prescribed to help open airways. -Continue taking Allegra to help with fluid behind ear drums.  Make sure you wash your hands before holding 64 month old and make sure no mouth contact to prevent baby from getting virus.  If you are not better in 10-14 days, then please call the  office.  Danielle Mink, Lise Auer, PA-C 6:30 PM [facility]

## 2014-02-24 NOTE — Patient Instructions (Signed)
-Take the Tessalon perles as prescribed for cough -Take Prednisone as prescribed for inflammation. -Take Z-Pak starting on 03/02/14 if you are not better. -Continue albuterol inhaler as prescribed to open up airways. -Drink plenty of fluids to thin out mucous and stay hydrated. -continue Mucinex.  If you are not better in 10-14 days, then please call the office.  Acute Bronchitis Bronchitis is inflammation of the airways that extend from the windpipe into the lungs (bronchi). The inflammation often causes mucus to develop. This leads to a cough, which is the most common symptom of bronchitis.  In acute bronchitis, the condition usually develops suddenly and goes away over time, usually in a couple weeks. Smoking, allergies, and asthma can make bronchitis worse. Repeated episodes of bronchitis may cause further lung problems.  CAUSES Acute bronchitis is most often caused by the same virus that causes a cold. The virus can spread from person to person (contagious) through coughing, sneezing, and touching contaminated objects. SIGNS AND SYMPTOMS   Cough.   Fever.   Coughing up mucus.   Body aches.   Chest congestion.   Chills.   Shortness of breath.   Sore throat.  DIAGNOSIS  Acute bronchitis is usually diagnosed through a physical exam. Your health care provider will also ask you questions about your medical history. Tests, such as chest X-rays, are sometimes done to rule out other conditions.  TREATMENT  Acute bronchitis usually goes away in a couple weeks. Oftentimes, no medical treatment is necessary. Medicines are sometimes given for relief of fever or cough. Antibiotic medicines are usually not needed but may be prescribed in certain situations. In some cases, an inhaler may be recommended to help reduce shortness of breath and control the cough. A cool mist vaporizer may also be used to help thin bronchial secretions and make it easier to clear the chest.  HOME CARE  INSTRUCTIONS  Get plenty of rest.   Drink enough fluids to keep your urine clear or pale yellow (unless you have a medical condition that requires fluid restriction). Increasing fluids may help thin your respiratory secretions (sputum) and reduce chest congestion, and it will prevent dehydration.   Take medicines only as directed by your health care provider.  If you were prescribed an antibiotic medicine, finish it all even if you start to feel better.  Avoid smoking and secondhand smoke. Exposure to cigarette smoke or irritating chemicals will make bronchitis worse. If you are a smoker, consider using nicotine gum or skin patches to help control withdrawal symptoms. Quitting smoking will help your lungs heal faster.   Reduce the chances of another bout of acute bronchitis by washing your hands frequently, avoiding people with cold symptoms, and trying not to touch your hands to your mouth, nose, or eyes.   Keep all follow-up visits as directed by your health care provider.  SEEK MEDICAL CARE IF: Your symptoms do not improve after 1 week of treatment.  SEEK IMMEDIATE MEDICAL CARE IF:  You develop an increased fever or chills.   You have chest pain.   You have severe shortness of breath.  You have bloody sputum.   You develop dehydration.  You faint or repeatedly feel like you are going to pass out.  You develop repeated vomiting.  You develop a severe headache. MAKE SURE YOU:   Understand these instructions.  Will watch your condition.  Will get help right away if you are not doing well or get worse. Document Released: 05/19/2004 Document Revised: 08/26/2013 Document  Reviewed: 10/02/2012 Exit[employer] Patient Information 2015 [place], [place]. This information is not intended to replace advice given to you by your health care provider. Make sure you discuss any questions you have with your health care provider.

## 2014-03-03 ENCOUNTER — Other Ambulatory Visit: Payer: Self-pay | Admitting: Internal Medicine

## 2014-03-03 MED ORDER — LEVOFLOXACIN 500 MG PO TABS: ORAL_TABLET | ORAL | Status: DC

## 2014-03-07 ENCOUNTER — Other Ambulatory Visit: Payer: Self-pay | Admitting: *Deleted

## 2014-03-07 MED ORDER — BUPROPION HCL ER (XL) 150 MG PO TB24: ORAL_TABLET | ORAL | Status: DC

## 2014-04-09 ENCOUNTER — Other Ambulatory Visit: Payer: Self-pay | Admitting: Physician Assistant

## 2014-04-09 MED ORDER — NEOMYCIN-POLYMYXIN-DEXAMETH 0.1 % OP SUSP: 1.0000 [drp] | Freq: Four times a day (QID) | OPHTHALMIC | Status: DC

## 2014-05-02 ENCOUNTER — Other Ambulatory Visit: Payer: Self-pay | Admitting: Physician Assistant

## 2014-05-02 MED ORDER — AMPHETAMINE-DEXTROAMPHETAMINE 20 MG PO TABS: ORAL_TABLET | ORAL | Status: DC

## 2014-06-23 ENCOUNTER — Ambulatory Visit (INDEPENDENT_AMBULATORY_CARE_PROVIDER_SITE_OTHER): Payer: BLUE CROSS/BLUE SHIELD | Admitting: Internal Medicine

## 2014-06-23 ENCOUNTER — Encounter: Payer: Self-pay | Admitting: Internal Medicine

## 2014-06-23 VITALS — BP 136/90 | HR 64 | Temp 97.7°F | Resp 16 | Ht 70.0 in | Wt 209.4 lb

## 2014-06-23 DIAGNOSIS — I10 Essential (primary) hypertension: Secondary | ICD-10-CM

## 2014-06-23 DIAGNOSIS — R5383 Other fatigue: Secondary | ICD-10-CM

## 2014-06-23 DIAGNOSIS — F909 Attention-deficit hyperactivity disorder, unspecified type: Secondary | ICD-10-CM

## 2014-06-23 DIAGNOSIS — E785 Hyperlipidemia, unspecified: Secondary | ICD-10-CM

## 2014-06-23 DIAGNOSIS — F988 Other specified behavioral and emotional disorders with onset usually occurring in childhood and adolescence: Secondary | ICD-10-CM

## 2014-06-23 DIAGNOSIS — Z1212 Encounter for screening for malignant neoplasm of rectum: Secondary | ICD-10-CM

## 2014-06-23 DIAGNOSIS — Z111 Encounter for screening for respiratory tuberculosis: Secondary | ICD-10-CM

## 2014-06-23 DIAGNOSIS — Z0001 Encounter for general adult medical examination with abnormal findings: Secondary | ICD-10-CM | POA: Insufficient documentation

## 2014-06-23 DIAGNOSIS — E8881 Metabolic syndrome: Secondary | ICD-10-CM

## 2014-06-23 DIAGNOSIS — E559 Vitamin D deficiency, unspecified: Secondary | ICD-10-CM

## 2014-06-23 DIAGNOSIS — Z79899 Other long term (current) drug therapy: Secondary | ICD-10-CM

## 2014-06-23 DIAGNOSIS — J4520 Mild intermittent asthma, uncomplicated: Secondary | ICD-10-CM

## 2014-06-23 MED ORDER — MONTELUKAST SODIUM 10 MG PO TABS: ORAL_TABLET | ORAL | Status: DC

## 2014-06-23 NOTE — Progress Notes (Signed)
Patient ID: Cole Lozano, male   DOB: 23-Aug-1980, 34 y.o.   MRN: [id number]  Annual Comprehensive Examination  This very nice 34 y.o. MWM presents for complete physical.  Patient has been followed for elevated BP, Prediabetes/Insulin resistance, Hyperlipidemia, and Vitamin D Deficiency.   HTN predates since     . Patient's BP has been controlled at home.Today's BP: 136/90 mmHg. Patient denies any cardiac symptoms as chest pain, palpitations, shortness of breath, dizziness or ankle swelling.   [name] is not controlled with diet. Last lipids were not at goal - Total Chol 213*; HDL  53; LDL 139*; Triglycerides 105 on 12/23/2013.   Patient has prediabetes/insulin resistance with Nl A1c 5.2% and an elevated Insulin 94. Patient denies reactive hypoglycemic symptoms, visual blurring, diabetic polys or paresthesias. Last A1c was  5.6% on   12/23/2013.   Finally, patient has history of Vitamin D Deficiency and last vitamin D was 83 on 12/23/2013.   Medication Sig  . albuterol  HFA inhaler Inhale into the lungs every 6 (six) hours as needed for wheezing or shortness of breath.  . ADDERALL 20  1/2 to 1 tablet 2 or 3 x daily for ADD  . aspirin 81 MG tablet Take 81 mg by mouth daily.  Marland Kitchen buPROPion (WELLBUTRIN XL) 150 MG 24 hr tablet take 1 tablet by mouth once daily  . Cholecalciferol (VITAMIN D PO) Take 5,000 Units by mouth daily.  Marland Kitchen FLUVIRIN SUSP   . Glucosamine-Chondroitin  Take by mouth 2 (two) times daily.  . multivitamin (ONE-A-DAY MEN'S) TABS tablet Take 1 tablet by mouth daily.  Marland Kitchen OVER THE COUNTER MEDICATION Allergy tab daily  . levofloxacin (LEVAQUIN) 500 MG tablet Take 1 tablet daily with food for infection  . MAXITROL 0.1 % ophthalmic suspension Place 1 drop into both eyes 4 (four) times daily.   No Known Allergies   Past Medical History  Diagnosis Date  . Hypertension   . Allergy   . Asthma   . Anxiety   . Depression   . Vitamin D deficiency   . ADD (attention  deficit disorder)   . Hyperlipidemia   . Insulin resistance     history of normal A1C but insulin 60, 46   Health Maintenance  Topic Date Due  . INFLUENZA VACCINE  11/23/2013  . TETANUS/TDAP  06/14/2023  . HIV Screening  Completed   Immunization History  Administered Date(s) Administered  . PPD Test 06/13/2013, 06/23/2014  . Tdap 06/13/2013   Past Surgical History  Procedure Laterality Date  . Elbow surgery Left 1991    s/p trauma  . Tonsillectomy and adenoidectomy    . Hernia repair Right 2003    inguinal   Family History  Problem Relation Age of Onset  . Cancer Mother     skin  . Neurofibromatosis Brother   . Diabetes Other   . Stroke Other     History   Social History  . Marital Status: Single    Spouse Name: N/A  . Number of Children: N/A  . Years of Education: N/A   Occupational History  . Not on file.   Social History Main Topics  . Smoking status: Never Smoker   . Smokeless tobacco: Never Used  . Alcohol Use: 0.5 oz/week    1 drink(s) per week  . Drug Use: No  . Sexual Activity: Yes   Other Topics Concern  . Not on file   Social History Narrative    ROS Constitutional: Denies fever,  chills, weight loss/gain, headaches, insomnia, fatigue, night sweats or change in appetite. Eyes: Denies redness, blurred vision, diplopia, discharge, itchy or watery eyes.  ENT: Denies discharge, congestion, post nasal drip, epistaxis, sore throat, earache, hearing loss, dental pain, Tinnitus, Vertigo, Sinus pain or snoring.  Cardio: Denies chest pain, palpitations, irregular heartbeat, syncope, dyspnea, diaphoresis, orthopnea, PND, claudication or edema Respiratory: denies cough, dyspnea, DOE, pleurisy, hoarseness, laryngitis or wheezing.  Gastrointestinal: Denies dysphagia, heartburn, reflux, water brash, pain, cramps, nausea, vomiting, bloating, diarrhea, constipation, hematemesis, melena, hematochezia, jaundice or hemorrhoids Genitourinary: Denies dysuria,  frequency, urgency, nocturia, hesitancy, discharge, hematuria or flank pain Musculoskeletal: Denies arthralgia, myalgia, stiffness, Jt. Swelling, pain, limp or strain/sprain. Denies Falls. Skin: Denies puritis, rash, hives, warts, acne, eczema or change in skin lesion Neuro: No weakness, tremor, incoordination, spasms, paresthesia or pain Psychiatric: Denies confusion, memory loss or sensory loss. Denies Depression. Endocrine: Denies change in weight, skin, hair change, nocturia, and paresthesia, diabetic polys, visual blurring or hyper / hypo glycemic episodes.  Heme/Lymph: No excessive bleeding, bruising or enlarged lymph nodes.  Physical Exam  BP 136/90   Pulse 64  Temp 97.7 F   Resp 16  Ht 5\' 10"    Wt 209 lb 6.4 oz     BMI 30.05   General Appearance: Well nourished, in no apparent distress. Eyes: PERRLA, EOMs, conjunctiva no swelling or erythema, normal fundi and vessels. Sinuses: No frontal/maxillary tenderness ENT/Mouth: EACs patent / TMs  nl. Nares clear without erythema, swelling, mucoid exudates. Oral hygiene is good. No erythema, swelling, or exudate. Tongue normal, non-obstructing. Tonsils not swollen or erythematous. Hearing normal.  Neck: Supple, thyroid normal. No bruits, nodes or JVD. Respiratory: Respiratory effort normal.  BS equal and clear bilateral without rales, rhonci, wheezing or stridor. Cardio: Heart sounds are normal with regular rate and rhythm and no murmurs, rubs or gallops. Peripheral pulses are normal and equal bilaterally without edema. No aortic or femoral bruits. Chest: symmetric with normal excursions and percussion.  Abdomen: Flat, soft, with bowl sounds. Nontender, no guarding, rebound, hernias, masses, or organomegaly.  Lymphatics: Non tender without lymphadenopathy.  Genitourinary: No hernias.Testes nl.  Musculoskeletal: Full ROM all peripheral extremities, joint stability, 5/5 strength, and normal gait. Skin: Warm and dry without rashes, lesions,  cyanosis, clubbing or  ecchymosis.  Neuro: Cranial nerves intact, reflexes equal bilaterally. Normal muscle tone, no cerebellar symptoms. Sensation intact.  Pysch: Awake and oriented X 3 with normal affect, insight and judgment appropriate.   Assessment and Plan  1. Essential hypertension  - Microalbumin / creatinine urine ratio - EKG 12-Lead  2. Hyperlipidemia  - Lipid panel  3. Insulin resistance  - Hemoglobin A1c - Insulin, fasting  4. Vitamin D deficiency  - Vit D  25 hydroxy (rtn osteoporosis monitoring)  5. Asthma, mild intermittent, uncomplicated   6. ADD (attention deficit disorder)   7. Other fatigue  - Vitamin B12 - Iron and TIBC - TSH  8. Screening for rectal cancer  - POC Hemoccult Bld/Stl (3-Cd Home Screen); Future  9. Medication management  - Urine Microscopic - CBC with Differential/Platelet - BASIC METABOLIC PANEL WITH GFR - Hepatic function panel - Magnesium   Continue prudent diet as discussed, weight control, BP monitoring, regular exercise, and medications as discussed.  Discussed med effects and SE's. Routine screening labs and tests as requested with regular follow-up as recommended.

## 2014-06-23 NOTE — Patient Instructions (Signed)
Recommend the book "The END of DIETING" by Dr Excell Seltzer   & the book "The END of DIABETES " by Dr Excell Seltzer  At [email or web address] - get book & Audio CD's      Being diabetic has a  300% increased risk for heart attack, stroke, cancer, and alzheimer- type vascular dementia. It is very important that you work harder with diet by avoiding all foods that are white. Avoid white rice (brown & wild rice is OK), white potatoes (sweetpotatoes in moderation is OK), White bread or wheat bread or anything made out of white flour like bagels, donuts, rolls, buns, biscuits, cakes, pastries, cookies, pizza crust, and pasta (made from white flour & egg whites) - vegetarian pasta or spinach or wheat pasta is OK. Multigrain breads like Arnold's or [employer], or multigrain sandwich thins or flatbreads.  Diet, exercise and weight loss can reverse and cure diabetes in the early stages.  Diet, exercise and weight loss is very important in the control and prevention of complications of diabetes which affects every system in your body, ie. Brain - dementia/stroke, eyes - glaucoma/blindness, heart - heart attack/heart failure, kidneys - dialysis, stomach - gastric paralysis, intestines - malabsorption, nerves - severe painful neuritis, circulation - gangrene & loss of a leg(s), and finally cancer and Alzheimers.    I recommend avoid fried & greasy foods,  sweets/candy, white rice (brown or wild rice or Quinoa is OK), white potatoes (sweet potatoes are OK) - anything made from white flour - bagels, doughnuts, rolls, buns, biscuits,white and wheat breads, pizza crust and traditional pasta made of white flour & egg white(vegetarian pasta or spinach or wheat pasta is OK).  Multi-grain bread is OK - like multi-grain flat bread or sandwich thins. Avoid alcohol in excess. Exercise is also important.    Eat all the vegetables you want - avoid meat, especially red meat and dairy - especially cheese.  Cheese is the most  concentrated form of trans-fats which is the worst thing to clog up our arteries. Veggie cheese is OK which can be found in the fresh produce section at [employer] or [employer] or [employer]  Preventive Care for Adults A healthy lifestyle and preventive care can promote health and wellness. Preventive health guidelines for men include the following key practices:  A routine yearly physical is a good way to check with your health care provider about your health and preventative screening. It is a chance to share any concerns and updates on your health and to receive a thorough exam.  Visit your dentist for a routine exam and preventative care every 6 months. Brush your teeth twice a day and floss once a day. Good oral hygiene prevents tooth decay and gum disease.  The frequency of eye exams is based on your age, health, family medical history, use of contact lenses, and other factors. Follow your health care provider's recommendations for frequency of eye exams.  Eat a healthy diet. Foods such as vegetables, fruits, whole grains, low-fat dairy products, and lean protein foods contain the nutrients you need without too many calories. Decrease your intake of foods high in solid fats, added sugars, and salt. Eat the right amount of calories for you.Get information about a proper diet from your health care provider, if necessary.  Regular physical exercise is one of the most important things you can do for your health. Most adults should get at least 150 minutes of moderate-intensity exercise (any activity that increases your heart rate  and causes you to sweat) each week. In addition, most adults need muscle-strengthening exercises on 2 or more days a week.  Maintain a healthy weight. The body mass index (BMI) is a screening tool to identify possible weight problems. It provides an estimate of body fat based on height and weight. Your health care provider can find your BMI and can help you achieve or  maintain a healthy weight.For adults 20 years and older:  A BMI below 18.5 is considered underweight.  A BMI of 18.5 to 24.9 is normal.  A BMI of 25 to 29.9 is considered overweight.  A BMI of 30 and above is considered obese.  Maintain normal blood lipids and cholesterol levels by exercising and minimizing your intake of saturated fat. Eat a balanced diet with plenty of fruit and vegetables. Blood tests for lipids and cholesterol should begin at age 9 and be repeated every 5 years. If your lipid or cholesterol levels are high, you are over 50, or you are at high risk for heart disease, you may need your cholesterol levels checked more frequently.Ongoing high lipid and cholesterol levels should be treated with medicines if diet and exercise are not working.  If you smoke, find out from your health care provider how to quit. If you do not use tobacco, do not start.  Lung cancer screening is recommended for adults aged 80-80 years who are at high risk for developing lung cancer because of a history of smoking. A yearly low-dose CT scan of the lungs is recommended for people who have at least a 30-pack-year history of smoking and are a current smoker or have quit within the past 15 years. A pack year of smoking is smoking an average of 1 pack of cigarettes a day for 1 year (for example: 1 pack a day for 30 years or 2 packs a day for 15 years). Yearly screening should continue until the smoker has stopped smoking for at least 15 years. Yearly screening should be stopped for people who develop a health problem that would prevent them from having lung cancer treatment.  If you choose to drink alcohol, do not have more than 2 drinks per day. One drink is considered to be 12 ounces (355 mL) of beer, 5 ounces (148 mL) of wine, or 1.5 ounces (44 mL) of liquor.  High blood pressure causes heart disease and increases the risk of stroke. Your blood pressure should be checked. Ongoing high blood pressure  should be treated with medicines, if weight loss and exercise are not effective.  If you are 8-43 years old, ask your health care provider if you should take aspirin to prevent heart disease.  Diabetes screening involves taking a blood sample to check your fasting blood sugar level. Testing should be considered at a younger age or be carried out more frequently if you are overweight and have at least 1 risk factor for diabetes.  Colorectal cancer can be detected and often prevented. Most routine colorectal cancer screening begins at the age of 89 and continues through age 34. However, your health care provider may recommend screening at an earlier age if you have risk factors for colon cancer. On a yearly basis, your health care provider may provide home test kits to check for hidden blood in the stool. Use of a small camera at the end of a tube to directly examine the colon (sigmoidoscopy or colonoscopy) can detect the earliest forms of colorectal cancer. Talk to your health care provider about  this at age 50, when routine screening begins. Direct exam of the colon should be repeated every 5-10 years through age 75, unless early forms of precancerous polyps or small growths are found.  Screening for abdominal aortic aneurysm (AAA)  are recommended for persons over age 50 who have history of hypertensionor who are current or former smokers.  Talk with your health care provider about prostate cancer screening.  Testicular cancer screening is recommended for adult males. Screening includes self-exam, a health care provider exam, and other screening tests. Consult with your health care provider about any symptoms you have or any concerns you have about testicular cancer.  Use sunscreen. Apply sunscreen liberally and repeatedly throughout the day. You should seek shade when your shadow is shorter than you. Protect yourself by wearing long sleeves, pants, a wide-brimmed hat, and sunglasses year round,  whenever you are outdoors.  Once a month, do a whole-body skin exam, using a mirror to look at the skin on your back. Tell your health care provider about new moles, moles that have irregular borders, moles that are larger than a pencil eraser, or moles that have changed in shape or color.  Stay current with required vaccines (immunizations).  Influenza vaccine. All adults should be immunized every year.  Tetanus, diphtheria, and acellular pertussis (Td, Tdap) vaccine. An adult who has not previously received Tdap or who does not know his vaccine status should receive 1 dose of Tdap. This initial dose should be followed by tetanus and diphtheria toxoids (Td) booster doses every 10 years. Adults with an unknown or incomplete history of completing a 3-dose immunization series with Td-containing vaccines should begin or complete a primary immunization series including a Tdap dose. Adults should receive a Td booster every 10 years.  Zoster vaccine. One dose is recommended for adults aged 60 years or older unless certain conditions are present.    Pneumococcal 13-valent conjugate (PCV13) vaccine. When indicated, a person who is uncertain of his immunization history and has no record of immunization should receive the PCV13 vaccine. An adult aged 19 years or older who has certain medical conditions and has not been previously immunized should receive 1 dose of PCV13 vaccine. This PCV13 should be followed with a dose of pneumococcal polysaccharide (PPSV23) vaccine. The PPSV23 vaccine dose should be obtained at least 8 weeks after the dose of PCV13 vaccine. An adult aged 19 years or older who has certain medical conditions and previously received 1 or more doses of PPSV23 vaccine should receive 1 dose of PCV13. The PCV13 vaccine dose should be obtained 1 or more years after the last PPSV23 vaccine dose.    Pneumococcal polysaccharide (PPSV23) vaccine. When PCV13 is also indicated, PCV13 should be obtained  first. All adults aged 65 years and older should be immunized. An adult younger than age 65 years who has certain medical conditions should be immunized. Any person who resides in a nursing home or long-term care facility should be immunized. An adult smoker should be immunized. People with an immunocompromised condition and certain other conditions should receive both PCV13 and PPSV23 vaccines. People with human immunodeficiency virus (HIV) infection should be immunized as soon as possible after diagnosis. Immunization during chemotherapy or radiation therapy should be avoided. Routine use of PPSV23 vaccine is not recommended for American Indians, [place] Natives, or people younger than 65 years unless there are medical conditions that require PPSV23 vaccine. When indicated, people who have unknown immunization and have no record of immunization should receive   PPSV23 vaccine. One-time revaccination 5 years after the first dose of PPSV23 is recommended for people aged 19-64 years who have chronic kidney failure, nephrotic syndrome, asplenia, or immunocompromised conditions. People who received 1-2 doses of PPSV23 before age 22 years should receive another dose of PPSV23 vaccine at age 42 years or later if at least 5 years have passed since the previous dose. Doses of PPSV23 are not needed for people immunized with PPSV23 at or after age 41 years.  Hepatitis A vaccine. Adults who wish to be protected from this disease, have certain high-risk conditions, work with hepatitis A-infected animals, work in hepatitis A research labs, or travel to or work in countries with a high rate of hepatitis A should be immunized. Adults who were previously unvaccinated and who anticipate close contact with an international adoptee during the first 60 days after arrival in the [place] from a country with a high rate of hepatitis A should be immunized.  Hepatitis B vaccine. Adults should be immunized if they wish to be  protected from this disease, have certain high-risk conditions, may be exposed to blood or other infectious body fluids, are household contacts or sex partners of hepatitis B positive people, are clients or workers in certain care facilities, or travel to or work in countries with a high rate of hepatitis B.  Preventive Service / Frequency  Ages 1 to 74  Blood pressure check.  Lipid and cholesterol check.  Hepatitis C blood test.** / For any individual with known risks for hepatitis C.  Skin self-exam. / Monthly.  Influenza vaccine. / Every year.  Tetanus, diphtheria, and acellular pertussis (Tdap, Td) vaccine.** / Consult your health care provider. 1 dose of Td every 10 years.  HPV vaccine. / 3 doses over 6 months, if 32 or younger.  Measles, mumps, rubella (MMR) vaccine.** / You need at least 1 dose of MMR if you were born in 1957 or later. You may also need a second dose.  Pneumococcal 13-valent conjugate (PCV13) vaccine.** / Consult your health care provider.  Pneumococcal polysaccharide (PPSV23) vaccine.** / 1 to 2 doses if you smoke cigarettes or if you have certain conditions.  Meningococcal vaccine.** / 1 dose if you are age 5 to 6 years and a Market researcher living in a residence hall, or have one of several medical conditions. You may also need additional booster doses.  Hepatitis A vaccine.** / Consult your health care provider.  Hepatitis B vaccine.** / Consult your health care provider.

## 2014-06-24 LAB — BASIC METABOLIC PANEL WITH GFR
BUN: 11 mg/dL (ref 6–23)
CALCIUM: 9.9 mg/dL (ref 8.4–10.5)
CO2: 31 meq/L (ref 19–32)
CREATININE: 0.85 mg/dL (ref 0.50–1.35)
Chloride: 102 mEq/L (ref 96–112)
GFR, Est African American: >89 mL/min
GFR, Est Non African American: >89 mL/min
Glucose, Bld: 95 mg/dL (ref 70–99)
POTASSIUM: 4.2 meq/L (ref 3.5–5.3)
SODIUM: 143 meq/L (ref 135–145)

## 2014-06-24 LAB — CBC WITH DIFFERENTIAL/PLATELET
Basophils Absolute: 0 10*3/uL (ref 0.0–0.1)
Basophils Relative: 0 % (ref 0–1)
EOS ABS: 0.4 10*3/uL (ref 0.0–0.7)
Eosinophils Relative: 7 % — ABNORMAL HIGH (ref 0–5)
HCT: 42.9 % (ref 39.0–52.0)
HEMOGLOBIN: 14.5 g/dL (ref 13.0–17.0)
LYMPHS ABS: 2.5 10*3/uL (ref 0.7–4.0)
Lymphocytes Relative: 42 % (ref 12–46)
MCH: 29.1 pg (ref 26.0–34.0)
MCHC: 33.8 g/dL (ref 30.0–36.0)
MCV: 86 fL (ref 78.0–100.0)
MPV: 10.1 fL (ref 8.6–12.4)
Monocytes Absolute: 0.3 10*3/uL (ref 0.1–1.0)
Monocytes Relative: 5 % (ref 3–12)
NEUTROS ABS: 2.7 10*3/uL (ref 1.7–7.7)
NEUTROS PCT: 46 % (ref 43–77)
Platelets: 295 10*3/uL (ref 150–400)
RBC: 4.99 MIL/uL (ref 4.22–5.81)
RDW: 13.1 % (ref 11.5–15.5)
WBC: 5.9 10*3/uL (ref 4.0–10.5)

## 2014-06-24 LAB — LIPID PANEL
Cholesterol: 203 mg/dL — ABNORMAL HIGH (ref 0–200)
HDL: 40 mg/dL (ref >=40)
LDL CALC: 89 mg/dL (ref 0–99)
Total CHOL/HDL Ratio: 5.1 Ratio
Triglycerides: 369 mg/dL — ABNORMAL HIGH (ref <150)
VLDL: 74 mg/dL — ABNORMAL HIGH (ref 0–40)

## 2014-06-24 LAB — URINALYSIS, MICROSCOPIC ONLY
Bacteria, UA: NONE SEEN
Casts: NONE SEEN
Crystals: NONE SEEN
SQUAMOUS EPITHELIAL / LPF: NONE SEEN

## 2014-06-24 LAB — HEPATIC FUNCTION PANEL
ALK PHOS: 55 U/L (ref 39–117)
ALT: 39 U/L (ref 0–53)
AST: 24 U/L (ref 0–37)
Albumin: 4.8 g/dL (ref 3.5–5.2)
BILIRUBIN INDIRECT: 0.3 mg/dL (ref 0.2–1.2)
Bilirubin, Direct: 0.1 mg/dL (ref 0.0–0.3)
TOTAL PROTEIN: 7.1 g/dL (ref 6.0–8.3)
Total Bilirubin: 0.4 mg/dL (ref 0.2–1.2)

## 2014-06-24 LAB — MICROALBUMIN / CREATININE URINE RATIO
CREATININE, URINE: 125.2 mg/dL
MICROALB UR: 0.5 mg/dL (ref <2.0)
Microalb Creat Ratio: 4 mg/g (ref 0.0–30.0)

## 2014-06-24 LAB — VITAMIN D 25 HYDROXY (VIT D DEFICIENCY, FRACTURES): Vit D, 25-Hydroxy: 42 ng/mL (ref 30–100)

## 2014-06-24 LAB — HEMOGLOBIN A1C
Hgb A1c MFr Bld: 5.5 % (ref <5.7)
Mean Plasma Glucose: 111 mg/dL (ref <117)

## 2014-06-24 LAB — MAGNESIUM: MAGNESIUM: 1.9 mg/dL (ref 1.5–2.5)

## 2014-06-24 LAB — TSH: TSH: 3.258 u[IU]/mL (ref 0.350–4.500)

## 2014-06-24 LAB — IRON AND TIBC
%SAT: 28 % (ref 20–55)
Iron: 89 ug/dL (ref 42–165)
TIBC: 315 ug/dL (ref 215–435)
UIBC: 226 ug/dL (ref 125–400)

## 2014-06-24 LAB — INSULIN, FASTING: Insulin fasting, serum: 58.7 u[IU]/mL — ABNORMAL HIGH (ref 2.0–19.6)

## 2014-06-24 LAB — VITAMIN B12: VITAMIN B 12: 776 pg/mL (ref 211–911)

## 2014-07-07 ENCOUNTER — Other Ambulatory Visit: Payer: Self-pay | Admitting: Internal Medicine

## 2014-07-07 DIAGNOSIS — F988 Other specified behavioral and emotional disorders with onset usually occurring in childhood and adolescence: Secondary | ICD-10-CM

## 2014-07-07 MED ORDER — AMPHETAMINE-DEXTROAMPHETAMINE 20 MG PO TABS: ORAL_TABLET | ORAL | Status: DC

## 2014-08-19 LAB — TB SKIN TEST
Induration: 0 mm
TB SKIN TEST: NEGATIVE

## 2014-09-04 ENCOUNTER — Other Ambulatory Visit: Payer: Self-pay | Admitting: Internal Medicine

## 2014-09-04 DIAGNOSIS — F988 Other specified behavioral and emotional disorders with onset usually occurring in childhood and adolescence: Secondary | ICD-10-CM

## 2014-09-04 MED ORDER — AMPHETAMINE-DEXTROAMPHETAMINE 20 MG PO TABS: ORAL_TABLET | ORAL | Status: DC

## 2014-11-03 ENCOUNTER — Other Ambulatory Visit: Payer: Self-pay | Admitting: *Deleted

## 2014-11-03 DIAGNOSIS — F988 Other specified behavioral and emotional disorders with onset usually occurring in childhood and adolescence: Secondary | ICD-10-CM

## 2014-11-03 MED ORDER — AMPHETAMINE-DEXTROAMPHETAMINE 20 MG PO TABS: ORAL_TABLET | ORAL | Status: DC

## 2015-01-05 ENCOUNTER — Ambulatory Visit: Payer: Self-pay | Admitting: Internal Medicine

## 2015-01-12 ENCOUNTER — Other Ambulatory Visit: Payer: Self-pay | Admitting: *Deleted

## 2015-01-12 DIAGNOSIS — F988 Other specified behavioral and emotional disorders with onset usually occurring in childhood and adolescence: Secondary | ICD-10-CM

## 2015-01-12 MED ORDER — AMPHETAMINE-DEXTROAMPHETAMINE 20 MG PO TABS: ORAL_TABLET | ORAL | Status: DC

## 2015-01-16 ENCOUNTER — Ambulatory Visit (INDEPENDENT_AMBULATORY_CARE_PROVIDER_SITE_OTHER): Payer: BLUE CROSS/BLUE SHIELD | Admitting: Physician Assistant

## 2015-01-16 VITALS — BP 120/80 | HR 72 | Temp 97.7°F | Resp 18 | Wt 209.0 lb

## 2015-01-16 DIAGNOSIS — F909 Attention-deficit hyperactivity disorder, unspecified type: Secondary | ICD-10-CM

## 2015-01-16 DIAGNOSIS — F988 Other specified behavioral and emotional disorders with onset usually occurring in childhood and adolescence: Secondary | ICD-10-CM

## 2015-01-16 MED ORDER — AMPHETAMINE-DEXTROAMPHETAMINE 20 MG PO TABS: ORAL_TABLET | ORAL | Status: DC

## 2015-01-16 NOTE — Patient Instructions (Signed)
Before you even begin to attack a weight-loss plan, it pays to remember this: You are not fat. You have fat. Losing weight isn't about blame or shame; it's simply another achievement to accomplish. Dieting is like any other skill-you have to buckle down and work at it. As long as you act in a smart, reasonable way, you'll ultimately get where you want to be. Here are some weight loss pearls for you.  1. It's Not a Diet. It's a Lifestyle Thinking of a diet as something you're on and suffering through only for the short term doesn't work. To shed weight and keep it off, you need to make permanent changes to the way you eat. It's OK to indulge occasionally, of course, but if you cut calories temporarily and then revert to your old way of eating, you'll gain back the weight quicker than you can say yo-yo. Use it to lose it. Research shows that one of the best predictors of long-term weight loss is how many pounds you drop in the first month. For that reason, nutritionists often suggest being stricter for the first two weeks of your new eating strategy to build momentum. Cut out added sugar and alcohol and avoid unrefined carbs. After that, figure out how you can reincorporate them in a way that's healthy and maintainable.  2. There's a Right Way to Exercise Working out burns calories and fat and boosts your metabolism by building muscle. But those trying to lose weight are notorious for overestimating the number of calories they burn and underestimating the amount they take in. Unfortunately, your system is biologically programmed to hold on to extra pounds and that means when you start exercising, your body senses the deficit and ramps up its hunger signals. If you're not diligent, you'll eat everything you burn and then some. Use it to lose it. Cardio gets all the exercise glory, but strength and interval training are the real heroes. They help you build lean muscle, which in turn increases your metabolism and  calorie-burning ability 3. Don't Overreact to Mild Hunger Some people have a hard time losing weight because of hunger anxiety. To them, being hungry is bad-something to be avoided at all costs-so they carry snacks with them and eat when they don't need to. Others eat because they're stressed out or bored. While you never want to get to the point of being ravenous (that's when bingeing is likely to happen), a hunger pang, a craving, or the fact that it's 3:00 p.m. should not send you racing for the vending machine or obsessing about the energy bar in your purse. Ideally, you should put off eating until your stomach is growling and it's difficult to concentrate.  Use it to lose it. When you feel the urge to eat, use the HALT method. Ask yourself, Am I really hungry? Or am I angry or anxious, lonely or bored, or tired? If you're still not certain, try the apple test. If you're truly hungry, an apple should seem delicious; if it doesn't, something else is going on. Or you can try drinking water and making yourself busy, if you are still hungry try a healthy snack.  4. Not All Calories Are Created Equal The mechanics of weight loss are pretty simple: Take in fewer calories than you use for energy. But the kind of food you eat makes all the difference. Processed food that's high in saturated fat and refined starch or sugar can cause inflammation that disrupts the hormone signals that tell  your brain you're full. The result: You eat a lot more.  Use it to lose it. Clean up your diet. Swap in whole, unprocessed foods, including vegetables, lean protein, and healthy fats that will fill you up and give you the biggest nutritional bang for your calorie buck. In a few weeks, as your brain starts receiving regular hunger and fullness signals once again, you'll notice that you feel less hungry overall and naturally start cutting back on the amount you eat.  5. Protein, Produce, and Plant-Based Fats Are Your Weight-Loss  [place] Here's why eating the three Ps regularly will help you drop pounds. Protein fills you up. You need it to build lean muscle, which keeps your metabolism humming so that you can torch more fat. People in a weight-loss program who ate double the recommended daily allowance for protein (about 110 grams for a 150-pound woman) lost 70 percent of their weight from fat, while people who ate the RDA lost only about 40 percent, one study found. Produce is packed with filling fiber. "It's very difficult to consume too many calories if you're eating a lot of vegetables. Example: Three cups of broccoli is a lot of food, yet only 93 calories. (Fruit is another story. It can be easy to overeat and can contain a lot of calories from sugar, so be sure to monitor your intake.) Plant-based fats like olive oil and those in avocados and nuts are healthy and extra satiating.  Use it to lose it. Aim to incorporate each of the three Ps into every meal and snack. People who eat protein throughout the day are able to keep weight off, according to a study in the American Journal of Clinical Nutrition. In addition to meat, poultry and seafood, good sources are beans, lentils, eggs, tofu, and yogurt. As for fat, keep portion sizes in check by measuring out salad dressing, oil, and nut butters (shoot for one to two tablespoons). Finally, eat veggies or a little fruit at every meal. People who did that consumed 308 fewer calories but didn't feel any hungrier than when they didn't eat more produce.  7. How You Eat Is As Important As What You Eat In order for your brain to register that you're full, you need to focus on what you're eating. Sit down whenever you eat, preferably at a table. Turn off the TV or computer, put down your phone, and look at your food. Smell it. Chew slowly, and don't put another bite on your fork until you swallow. When women ate lunch this attentively, they consumed 30 percent less when snacking later than  those who listened to an audiobook at lunchtime, according to a study in the British Journal of Nutrition. 8. Weighing Yourself Really Works The scale provides the best evidence about whether your efforts are paying off. Seeing the numbers tick up or down or stagnate is motivation to keep going-or to rethink your approach. A 2015 study at [employer] found that daily weigh-ins helped people lose more weight, keep it off, and maintain that loss, even after two years. Use it to lose it. Step on the scale at the same time every day for the best results. If your weight shoots up several pounds from one weigh-in to the next, don't freak out. Eating a lot of salt the night before or having your period is the likely culprit. The number should return to normal in a day or two. It's a steady climb that you need to do something about.   9. Too Much Stress and Too Little Sleep Are Your Enemies When you're tired and frazzled, your body cranks up the production of cortisol, the stress hormone that can cause carb cravings. Not getting enough sleep also boosts your levels of ghrelin, a hormone associated with hunger, while suppressing leptin, a hormone that signals fullness and satiety. People on a diet who slept only five and a half hours a night for two weeks lost 55 percent less fat and were hungrier than those who slept eight and a half hours, according to a study in the [place]. Use it to lose it. Prioritize sleep, aiming for seven hours or more a night, which research shows helps lower stress. And make sure you're getting quality zzz's. If a snoring spouse or a fidgety cat wakes you up frequently throughout the night, you may end up getting the equivalent of just four hours of sleep, according to a study from [facility]. Keep pets out of the bedroom, and use a white-noise app to drown out snoring. 10. You Will Hit a plateau-And You Can Bust Through It As you slim down, your  body releases much less leptin, the fullness hormone.  If you're not strength training, start right now. Building muscle can raise your metabolism to help you overcome a plateau. To keep your body challenged and burning calories, incorporate new moves and more intense intervals into your workouts or add another sweat session to your weekly routine. Alternatively, cut an extra 100 calories or so a day from your diet. Now that you've lost weight, your body simply doesn't need as much fuel.   We want weight loss that will last so you should lose 1-2 pounds a week.  THAT IS IT! Please pick THREE things a month to change. Once it is a habit check off the item. Then pick another three items off the list to become habits.  If you are already doing a habit on the list GREAT!  Cross that item off! o Don't drink your calories. Ie, alcohol, soda, fruit juice, and sweet tea.  o Drink more water. Drink a glass when you feel hungry or before each meal.  o Eat breakfast - Complex carb and protein (likeDannon light and fit yogurt, oatmeal, fruit, eggs, [place] bacon). o Measure your cereal.  Eat no more than one cup a day. (ie [place]) o Eat an apple a day. o Add a vegetable a day. o Try a new vegetable a month. o Use Pam! Stop using oil or butter to cook. o Don't finish your plate or use smaller plates. o Share your dessert. o Eat sugar free Jello for dessert or frozen grapes. o Don't eat 2-3 hours before bed. o Switch to whole wheat bread, pasta, and brown rice. o Make healthier choices when you eat out. No fries! o Pick baked chicken, NOT fried. o Don't forget to SLOW DOWN when you eat. It is not going anywhere.  o Take the stairs. o Park far away in the parking lot o [employer] (or weights) for 10 minutes while watching TV. o Walk at work for 10 minutes during break. o Walk outside 1 time a week with your friend, kids, dog, or significant other. o Start a walking group at [place] the mall as  much as you can tolerate.  o Keep a food diary. o Weigh yourself daily. o Walk for 15 minutes 3 days per week. o Cook at home more often and  eat out less.  If life happens and you go back to old habits, it is okay.  Just start over. You can do it!   If you experience chest pain, get short of breath, or tired during the exercise, please stop immediately and inform your doctor.

## 2015-01-16 NOTE — Progress Notes (Signed)
Assessment and Plan:  Hypertension: Continue medication, monitor blood pressure at home. Continue DASH diet. Cholesterol: Continue diet and exercise. Check cholesterol.  Pre-diabetes-Continue diet and exercise. Check A1C Vitamin D Def- check level and continue medications.  ADD-  Continue ADD medication, helps with focus, no AE's. The patient was counseled on the addictive nature of the medication and was encouraged to take drug holidays when not needed.    Continue diet and meds as discussed. Further disposition pending results of labs.  HPI 34 y.o. male  presents for 6 month follow up with hypertension, hyperlipidemia, prediabetes and vitamin D. His blood pressure has been controlled at home, today their BP is BP: 120/80 mmHg He does workout. He denies chest pain, shortness of breath, dizziness.  He is not on cholesterol medication and denies myalgias. His cholesterol is not at goal. The cholesterol last visit was:   Lab Results  Component Value Date   CHOL 203* 06/23/2014   HDL 40 06/23/2014   LDLCALC 89 06/23/2014   TRIG 369* 06/23/2014   CHOLHDL 5.1 06/23/2014   He has been working on diet and exercise for prediabetes/insuling resistance, and denies polydipsia, polyuria and visual disturbances. Last A1C in the office was:  Lab Results  Component Value Date   HGBA1C 5.5 06/23/2014   Patient is on Vitamin D supplement.   Lab Results  Component Value Date   VD25OH 42 06/23/2014     16 months, Cole Lozano, getting all four molars in.  BMI is Body mass index is 29.99 kg/(m^2)., He is working on diet and exercise and has done well. He states his wifes does not think he has sleep apnea, his BP has been better, decreased stress.  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  He has taken himself off the wellbutrin.   Current Medications:  Current Outpatient Prescriptions on File Prior to Visit  Medication Sig Dispense Refill  . albuterol (PROVENTIL HFA;VENTOLIN HFA) 108 (90 BASE) MCG/ACT inhaler Inhale into the lungs every 6 (six) hours as needed for wheezing or shortness of breath.    . amphetamine-dextroamphetamine (ADDERALL) 20 MG tablet 1/2 to 1 tablet 2 or 3 x daily for ADD 90 tablet 0  . aspirin 81 MG tablet Take 81 mg by mouth daily.    Marland Kitchen buPROPion (WELLBUTRIN XL) 150 MG 24 hr tablet take 1 tablet by mouth once daily 30 tablet 3  . Cholecalciferol (VITAMIN D PO) Take 5,000 Units by mouth daily.    . Glucosamine-Chondroitin (GLUCOSAMINE CHONDR COMPLEX PO) Take by mouth 2 (two) times daily.    . montelukast (SINGULAIR) 10 MG tablet Take 1 tablet daily for allergies & asthma 90 tablet 3  . multivitamin (ONE-A-DAY MEN'S) TABS tablet Take 1 tablet by mouth daily.    Marland Kitchen OVER THE COUNTER MEDICATION Allergy tab daily     No current facility-administered medications on file prior to visit.   Medical History:  Past Medical History  Diagnosis Date  . Hypertension   . Allergy   . Asthma   . Anxiety   . Depression   . Vitamin D deficiency   .  ADD (attention deficit disorder)   . Hyperlipidemia   . Insulin resistance     history of normal A1C but insulin 60, 45   Allergies: No Known Allergies   Review of Systems  Constitutional: Negative.   HENT: Negative.   Eyes: Negative.   Respiratory: Negative.   Cardiovascular: Negative.   Gastrointestinal: Negative.   Genitourinary: Negative.   Musculoskeletal: Positive for joint pain (right knee) and neck pain (carrying heavy book bag for work). Negative for myalgias, back pain and falls.  Skin: Negative.   Neurological: Negative.   Endo/Heme/Allergies: Negative.   Psychiatric/Behavioral:  Negative.      Family history- Review and unchanged Social history- Review and unchanged Physical Exam: BP 120/80 mmHg  Pulse 72  Temp(Src) 97.7 F (36.5 C)  Resp 18  Wt 209 lb (94.802 kg) Wt Readings from Last 3 Encounters:  01/16/15 209 lb (94.802 kg)  06/23/14 209 lb 6.4 oz (94.983 kg)  02/24/14 207 lb (93.895 kg)   General Appearance: Well nourished, in no apparent distress. Eyes: PERRLA, EOMs, conjunctiva no swelling or erythema Sinuses: No Frontal/maxillary tenderness ENT/Mouth: Ext aud canals clear, TMs without erythema, bulging. No erythema, swelling, or exudate on post pharynx.  Tonsils not swollen or erythematous. Hearing normal.  Neck: Supple, thyroid normal.  Respiratory: Respiratory effort normal, BS equal bilaterally without rales, rhonchi, wheezing or stridor.  Cardio: RRR with no MRGs. Brisk peripheral pulses without edema.  Abdomen: Soft, + BS.  Non tender, no guarding, rebound, hernias, masses. Lymphatics: Non tender without lymphadenopathy.  Musculoskeletal: Full ROM, 5/5 strength, normal gait.  Skin: Warm, dry without rashes, lesions, ecchymosis.  Neuro: Cranial nerves intact. Normal muscle tone, no cerebellar symptoms. Sensation intact.  Psych: Awake and oriented X 3, normal affect, Insight and Judgment appropriate.    Cole Lozano 11:15 AM

## 2015-04-15 ENCOUNTER — Other Ambulatory Visit: Payer: Self-pay | Admitting: Physician Assistant

## 2015-04-15 DIAGNOSIS — F988 Other specified behavioral and emotional disorders with onset usually occurring in childhood and adolescence: Secondary | ICD-10-CM

## 2015-04-15 MED ORDER — AMPHETAMINE-DEXTROAMPHETAMINE 20 MG PO TABS: ORAL_TABLET | ORAL | Status: DC

## 2015-06-02 ENCOUNTER — Other Ambulatory Visit: Payer: Self-pay | Admitting: Internal Medicine

## 2015-06-02 DIAGNOSIS — F988 Other specified behavioral and emotional disorders with onset usually occurring in childhood and adolescence: Secondary | ICD-10-CM

## 2015-06-02 MED ORDER — AMPHETAMINE-DEXTROAMPHETAMINE 20 MG PO TABS: ORAL_TABLET | ORAL | Status: DC

## 2015-07-07 ENCOUNTER — Encounter: Payer: Self-pay | Admitting: Internal Medicine

## 2015-07-24 ENCOUNTER — Ambulatory Visit (INDEPENDENT_AMBULATORY_CARE_PROVIDER_SITE_OTHER): Payer: Managed Care, Other (non HMO) | Admitting: Internal Medicine

## 2015-07-24 ENCOUNTER — Encounter: Payer: Self-pay | Admitting: Internal Medicine

## 2015-07-24 VITALS — BP 124/82 | HR 76 | Temp 97.0°F | Resp 16 | Ht 70.5 in | Wt 216.4 lb

## 2015-07-24 DIAGNOSIS — Z1212 Encounter for screening for malignant neoplasm of rectum: Secondary | ICD-10-CM

## 2015-07-24 DIAGNOSIS — Z0000 Encounter for general adult medical examination without abnormal findings: Secondary | ICD-10-CM | POA: Diagnosis not present

## 2015-07-24 DIAGNOSIS — Z111 Encounter for screening for respiratory tuberculosis: Secondary | ICD-10-CM | POA: Diagnosis not present

## 2015-07-24 DIAGNOSIS — Z125 Encounter for screening for malignant neoplasm of prostate: Secondary | ICD-10-CM

## 2015-07-24 DIAGNOSIS — Z79899 Other long term (current) drug therapy: Secondary | ICD-10-CM | POA: Diagnosis not present

## 2015-07-24 DIAGNOSIS — I10 Essential (primary) hypertension: Secondary | ICD-10-CM

## 2015-07-24 DIAGNOSIS — E8881 Metabolic syndrome: Secondary | ICD-10-CM

## 2015-07-24 DIAGNOSIS — E559 Vitamin D deficiency, unspecified: Secondary | ICD-10-CM

## 2015-07-24 DIAGNOSIS — R5383 Other fatigue: Secondary | ICD-10-CM

## 2015-07-24 DIAGNOSIS — Z0001 Encounter for general adult medical examination with abnormal findings: Secondary | ICD-10-CM

## 2015-07-24 DIAGNOSIS — F988 Other specified behavioral and emotional disorders with onset usually occurring in childhood and adolescence: Secondary | ICD-10-CM

## 2015-07-24 DIAGNOSIS — E785 Hyperlipidemia, unspecified: Secondary | ICD-10-CM

## 2015-07-24 LAB — HEPATIC FUNCTION PANEL
ALBUMIN: 5.1 g/dL (ref 3.6–5.1)
ALK PHOS: 65 U/L (ref 40–115)
ALT: 56 U/L — AB (ref 9–46)
AST: 32 U/L (ref 10–40)
BILIRUBIN TOTAL: 0.5 mg/dL (ref 0.2–1.2)
Bilirubin, Direct: 0.1 mg/dL (ref <=0.2)
Indirect Bilirubin: 0.4 mg/dL (ref 0.2–1.2)
Total Protein: 7.6 g/dL (ref 6.1–8.1)

## 2015-07-24 LAB — IRON AND TIBC
%SAT: 21 % (ref 15–60)
IRON: 70 ug/dL (ref 50–180)
TIBC: 326 ug/dL (ref 250–425)
UIBC: 256 ug/dL (ref 125–400)

## 2015-07-24 LAB — BASIC METABOLIC PANEL WITH GFR
BUN: 14 mg/dL (ref 7–25)
CHLORIDE: 102 mmol/L (ref 98–110)
CO2: 24 mmol/L (ref 20–31)
CREATININE: 0.88 mg/dL (ref 0.60–1.35)
Calcium: 9.5 mg/dL (ref 8.6–10.3)
GFR, Est African American: >89 mL/min (ref >=60)
GFR, Est Non African American: >89 mL/min (ref >=60)
Glucose, Bld: 105 mg/dL — ABNORMAL HIGH (ref 65–99)
POTASSIUM: 4.3 mmol/L (ref 3.5–5.3)
Sodium: 138 mmol/L (ref 135–146)

## 2015-07-24 LAB — LIPID PANEL
CHOL/HDL RATIO: 6.3 ratio — AB (ref <=5.0)
Cholesterol: 226 mg/dL — ABNORMAL HIGH (ref 125–200)
HDL: 36 mg/dL — AB (ref >=40)
LDL CALC: 147 mg/dL — AB (ref <130)
Triglycerides: 215 mg/dL — ABNORMAL HIGH (ref <150)
VLDL: 43 mg/dL — ABNORMAL HIGH (ref <30)

## 2015-07-24 LAB — CBC WITH DIFFERENTIAL/PLATELET
BASOS ABS: 0 10*3/uL (ref 0.0–0.1)
BASOS PCT: 0 % (ref 0–1)
EOS ABS: 0.3 10*3/uL (ref 0.0–0.7)
Eosinophils Relative: 6 % — ABNORMAL HIGH (ref 0–5)
HEMATOCRIT: 44.3 % (ref 39.0–52.0)
HEMOGLOBIN: 15 g/dL (ref 13.0–17.0)
LYMPHS ABS: 2.6 10*3/uL (ref 0.7–4.0)
Lymphocytes Relative: 49 % — ABNORMAL HIGH (ref 12–46)
MCH: 28.8 pg (ref 26.0–34.0)
MCHC: 33.9 g/dL (ref 30.0–36.0)
MCV: 85 fL (ref 78.0–100.0)
MONO ABS: 0.4 10*3/uL (ref 0.1–1.0)
MPV: 10.2 fL (ref 8.6–12.4)
Monocytes Relative: 7 % (ref 3–12)
NEUTROS ABS: 2.1 10*3/uL (ref 1.7–7.7)
Neutrophils Relative %: 38 % — ABNORMAL LOW (ref 43–77)
PLATELETS: 275 10*3/uL (ref 150–400)
RBC: 5.21 MIL/uL (ref 4.22–5.81)
RDW: 13.2 % (ref 11.5–15.5)
WBC: 5.4 10*3/uL (ref 4.0–10.5)

## 2015-07-24 LAB — MAGNESIUM: MAGNESIUM: 2 mg/dL (ref 1.5–2.5)

## 2015-07-24 LAB — TESTOSTERONE: Testosterone: 283 ng/dL (ref 250–827)

## 2015-07-24 LAB — HEMOGLOBIN A1C
HEMOGLOBIN A1C: 5.6 % (ref <5.7)
Mean Plasma Glucose: 114 mg/dL

## 2015-07-24 LAB — TSH: TSH: 1.21 m[IU]/L (ref 0.40–4.50)

## 2015-07-24 LAB — VITAMIN B12: VITAMIN B 12: 793 pg/mL (ref 200–1100)

## 2015-07-24 NOTE — Progress Notes (Signed)
Patient ID: Cole FossaStephen Lozano, male   DOB: [birth year], 35 y.o.   MRN: [id number]  Annual  Screening/Preventative Visit And Comprehensive Evaluation & Examination  This very nice 35 y.o. MWM presents for a Wellness/Preventative Visit & comprehensive evaluation and management of multiple medical co-morbidities.  Patient has been followed for HTN, Prediabetes, Hyperlipidemia and Vitamin D Deficiency.   Patient has hx/o labile HTN predates since Feb 2016 with BP 136/90 and has been followed expectantly. Patient's BP has been controlled at home.Today's BP: 124/82 mmHg. Patient denies any cardiac symptoms as chest pain, palpitations, shortness of breath, dizziness or ankle swelling.   [name] is controlled with diet.Last lipids were at goal wit Total Chol 203, HDL 40, LDL 89 at goal and elevated Trig 369 in Feb 2016.   Patient has prediabetes Morbid Obesity (BMI 30+) and is screened expectantly frr PreDiabetes  With hx/o A1c 5.2% and elevated Insulin 94 consistent with Insulin Resistance and patient denies reactive hypoglycemic symptoms, visual blurring, diabetic polys or paresthesias. Last A1c was 5.5% in Feb 2016.    Finally, patient has history of Vitamin D Deficiency  and last vitamin D was still low at 5342 in Feb 2016.   Medication Sig  . Albuterol  HFA inhaler Inhale into the lungs every 6 (six) hours as needed for wheezing or shortness of breath.  Marland Kitchen. aspirin 81 MG Take 81 mg by mouth daily.  Marland Kitchen. VITAMIN D Take 5,000 Units by mouth daily.  . Glucosamine-Chondroitin Take by mouth 2 (two) times daily.  . montelukast  10 MG  Take 1 tablet daily for allergies & asthma  . ONE-A-DAY MEN'S Take 1 tablet by mouth daily.  [name]. Allergra  Take 1 tab  daily  . ADDERALL 20 MG  1/2 to 1 tablet 2 or 3 x daily for ADD   No Known Allergies   Past Medical History  Diagnosis Date  . Hypertension   . Allergy   . Asthma   . Anxiety   . Depression   . Vitamin D deficiency   . ADD (attention  deficit disorder)   . Hyperlipidemia   . Insulin resistance     history of normal A1C but insulin 60, 45   Health Maintenance  Topic Date Due  . INFLUENZA VACCINE  11/24/2014  . TETANUS/TDAP  06/14/2023  . HIV Screening  Completed   Immunization History  Administered Date(s) Administered  . PPD Test 06/13/2013, 06/23/2014  . Tdap 06/13/2013   Past Surgical History  Procedure Laterality Date  . Elbow surgery Left 1991    s/p trauma  . Tonsillectomy and adenoidectomy    . Hernia repair Right 2003    inguinal   Family History  Problem Relation Age of Onset  . Cancer Mother     skin  . Neurofibromatosis Brother   . Diabetes Other   . Stroke Other    Social History   Social History  . Marital Status: Single    Spouse Name: N/A  . Number of Children: N/A  . Years of Education: N/A   Occupational History  . Computer Programme researcher, broadcasting/film/videoT & Networking specialist   Social History Main Topics  . Smoking status: Never Smoker   . Smokeless tobacco: Never Used  . Alcohol Use: 0.5 oz/week    1 drink(s) per week  . Drug Use: No  . Sexual Activity: Yes    ROS Constitutional: Denies fever, chills, weight loss/gain, headaches, insomnia,  night sweats or change in appetite. Does c/o fatigue. Eyes:  Denies redness, blurred vision, diplopia, discharge, itchy or watery eyes.  ENT: Denies discharge, congestion, post nasal drip, epistaxis, sore throat, earache, hearing loss, dental pain, Tinnitus, Vertigo, Sinus pain or snoring.  Cardio: Denies chest pain, palpitations, irregular heartbeat, syncope, dyspnea, diaphoresis, orthopnea, PND, claudication or edema Respiratory: denies cough, dyspnea, DOE, pleurisy, hoarseness, laryngitis or wheezing.  Gastrointestinal: Denies dysphagia, heartburn, reflux, water brash, pain, cramps, nausea, vomiting, bloating, diarrhea, constipation, hematemesis, melena, hematochezia, jaundice or hemorrhoids Genitourinary: Denies dysuria, frequency, urgency, nocturia,  hesitancy, discharge, hematuria or flank pain Musculoskeletal: Denies arthralgia, myalgia, stiffness, Jt. Swelling, pain, limp or strain/sprain. Denies Falls. Skin: Denies puritis, rash, hives, warts, acne, eczema or change in skin lesion Neuro: No weakness, tremor, incoordination, spasms, paresthesia or pain Psychiatric: Denies confusion, memory loss or sensory loss. Denies Depression. Endocrine: Denies change in weight, skin, hair change, nocturia, and paresthesia, diabetic polys, visual blurring or hyper / hypo glycemic episodes.  Heme/Lymph: No excessive bleeding, bruising or enlarged lymph nodes.  Physical Exam  BP 124/82 mmHg  Pulse 76  Temp(Src) 97 F (36.1 C)  Resp 16  Ht 5' 10.5" (1.791 m)  Wt 216 lb 6.4 oz (98.158 kg)  BMI 30.60 kg/m2  General Appearance: Well nourished, in no apparent distress. Eyes: PERRLA, EOMs, conjunctiva no swelling or erythema, normal fundi and vessels. Sinuses: No frontal/maxillary tenderness ENT/Mouth: EACs patent / TMs  nl. Nares clear without erythema, swelling, mucoid exudates. Oral hygiene is good. No erythema, swelling, or exudate. Tongue normal, non-obstructing. Tonsils not swollen or erythematous. Hearing normal.  Neck: Supple, thyroid normal. No bruits, nodes or JVD. Respiratory: Respiratory effort normal.  BS equal and clear bilateral without rales, rhonci, wheezing or stridor. Cardio: Heart sounds are normal with regular rate and rhythm and no murmurs, rubs or gallops. Peripheral pulses are normal and equal bilaterally without edema. No aortic or femoral bruits. Chest: symmetric with normal excursions and percussion.  Abdomen: Soft, with Nl bowel sounds. Nontender, no guarding, rebound, hernias, masses, or organomegaly.  Lymphatics: Non tender without lymphadenopathy.  Genitourinary: Deferred Musculoskeletal: Full ROM all peripheral extremities, joint stability, 5/5 strength, and normal gait. Skin: Warm and dry without rashes, lesions,  cyanosis, clubbing or  ecchymosis.  Neuro: Cranial nerves intact, reflexes equal bilaterally. Normal muscle tone, no cerebellar symptoms. Sensation intact.  Pysch: Alert and oriented X 3 with normal affect, insight and judgment appropriate.   Assessment and Plan  1. Annual Preventative/Screening Exam   - Microalbumin / creatinine urine ratio - POC Hemoccult Bld/Stl  - Urinalysis, Routine w reflex microscopic  - Vitamin B12 - Iron and TIBC - Testosterone - CBC with Differential/Platelet - BASIC METABOLIC PANEL WITH GFR - Hepatic function panel - Magnesium - Lipid panel - TSH - Hemoglobin A1c - Insulin, random - VITAMIN D 25 Hydroxy   2. Essential hypertension  - Microalbumin / creatinine urine ratio - TSH  3. Hyperlipidemia  - Lipid panel - TSH  4. Insulin resistance  - Hemoglobin A1c - Insulin, random  5. Vitamin D deficiency  - VITAMIN D 25 Hydroxy   6. ADD (attention deficit disorder)   7. Screening for rectal cancer  - POC Hemoccult Bld/Stl   8. Other fatigue  - Vitamin B12 - Iron and TIBC - Testosterone - CBC with Differential/Platelet - TSH  9. Medication management  - Urinalysis, Routine w reflex microscopic (not at [facility]) - CBC with Differential/Platelet - BASIC METABOLIC PANEL WITH GFR - Hepatic function panel - Magnesium   Continue prudent diet as discussed, weight  control, BP monitoring, regular exercise, and medications as discussed.  Discussed med effects and SE's. Routine screening labs and tests as requested with regular follow-up as recommended. Over 40 minutes of exam, counseling, chart review and high complex critical decision making was performed

## 2015-07-24 NOTE — Patient Instructions (Signed)
Recommend Adult Low Dose Aspirin or   coated  Aspirin 81 mg daily   To reduce risk of Colon Cancer 20 %,   Skin Cancer 26 % ,   Melanoma 46%   and   Pancreatic cancer 60%   ++++++++++++++++++++++++++++++++++++++++++++++++++++++ Vitamin D goal   is between 70-100.   Please make sure that you are taking your Vitamin D as directed.   It is very important as a natural anti-inflammatory   helping hair, skin, and nails, as well as reducing stroke and heart attack risk.   It helps your bones and helps with mood.  It also decreases numerous cancer risks so please take it as directed.   Low Vit D is associated with a 200-300% higher risk for CANCER   and 200-300% higher risk for HEART   ATTACK  &  STROKE.   .....................................Marland Kitchen  It is also associated with higher death rate at younger ages,   autoimmune diseases like Rheumatoid arthritis, Lupus, Multiple Sclerosis.     Also many other serious conditions, like depression, Alzheimer's  Dementia, infertility, muscle aches, fatigue, fibromyalgia - just to name a few.  ++++++++++++++++++++++++++++++++++++++++++++++++  Recommend the book "The END of DIETING" by Dr Excell Seltzer   & the book "The END of DIABETES " by Dr Excell Seltzer  At [email or web address] - get book & Audio CD's     Being diabetic has a  300% increased risk for heart attack, stroke, cancer, and alzheimer- type vascular dementia. It is very important that you work harder with diet by avoiding all foods that are white. Avoid white rice (brown & wild rice is OK), white potatoes (sweetpotatoes in moderation is OK), White bread or wheat bread or anything made out of white flour like bagels, donuts, rolls, buns, biscuits, cakes, pastries, cookies, pizza crust, and pasta (made from white flour & egg whites) - vegetarian pasta or spinach or wheat pasta is OK. Multigrain breads like Arnold's or Pepperidge [employer], or multigrain sandwich thins or flatbreads.  Diet,  exercise and weight loss can reverse and cure diabetes in the early stages.  Diet, exercise and weight loss is very important in the control and prevention of complications of diabetes which affects every system in your body, ie. Brain - dementia/stroke, eyes - glaucoma/blindness, heart - heart attack/heart failure, kidneys - dialysis, stomach - gastric paralysis, intestines - malabsorption, nerves - severe painful neuritis, circulation - gangrene & loss of a leg(s), and finally cancer and Alzheimers.    I recommend avoid fried & greasy foods,  sweets/candy, white rice (brown or wild rice or Quinoa is OK), white potatoes (sweet potatoes are OK) - anything made from white flour - bagels, doughnuts, rolls, buns, biscuits,white and wheat breads, pizza crust and traditional pasta made of white flour & egg white(vegetarian pasta or spinach or wheat pasta is OK).  Multi-grain bread is OK - like multi-grain flat bread or sandwich thins. Avoid alcohol in excess. Exercise is also important.    Eat all the vegetables you want - avoid meat, especially red meat and dairy - especially cheese.  Cheese is the most concentrated form of trans-fats which is the worst thing to clog up our arteries. Veggie cheese is OK which can be found in the fresh produce section at [employer] or [employer] or [employer]  ++++++++++++++++++++++++++++++++++++++++++++++++++ DASH Eating Plan  DASH stands for "Dietary Approaches to Stop Hypertension."   The DASH eating plan is a healthy eating plan that has been shown to reduce high blood  pressure (hypertension). Additional health benefits may include reducing the risk of type 2 diabetes mellitus, heart disease, and stroke. The DASH eating plan may also help with weight loss.  WHAT DO I NEED TO KNOW ABOUT THE DASH EATING PLAN?  For the DASH eating plan, you will follow these general guidelines:  Choose foods with a percent daily value for sodium of less than 5% (as listed on the food  label).  Use salt-free seasonings or herbs instead of table salt or sea salt.  Check with your health care provider or pharmacist before using salt substitutes.  Eat lower-sodium products, often labeled as "lower sodium" or "no salt added."  Eat fresh foods.  Eat more vegetables, fruits, and low-fat dairy products.    Choose whole grains. Look for the word "whole" as the first word in the ingredient list.  Choose fish   Limit sweets, desserts, sugars, and sugary drinks.  Choose heart-healthy fats.  Eat veggie cheese   Eat more home-cooked food and less restaurant, buffet, and fast food.  Limit fried foods.  Huffaker foods using methods other than frying.  Limit canned vegetables. If you do use them, rinse them well to decrease the sodium.  When eating at a restaurant, ask that your food be prepared with less salt, or no salt if possible.                      WHAT FOODS CAN I EAT?  Read Dr Fara Olden Fuhrman's books on The End of Dieting & The End of Diabetes  Grains  Whole grain or whole wheat bread. Brown rice. Whole grain or whole wheat pasta. Quinoa, bulgur, and whole grain cereals. Low-sodium cereals. Corn or whole wheat flour tortillas. Whole grain cornbread. Whole grain crackers. Low-sodium crackers.  Vegetables  Fresh or frozen vegetables (raw, steamed, roasted, or grilled). Low-sodium or reduced-sodium tomato and vegetable juices. Low-sodium or reduced-sodium tomato sauce and paste. Low-sodium or reduced-sodium canned vegetables.   Fruits  All fresh, canned (in natural juice), or frozen fruits.  Protein Products   All fish and seafood.  Dried beans, peas, or lentils. Unsalted nuts and seeds. Unsalted canned beans.  Dairy  Low-fat dairy products, such as skim or 1% milk, 2% or reduced-fat cheeses, low-fat ricotta or cottage cheese, or plain low-fat yogurt. Low-sodium or reduced-sodium cheeses.  Fats and Oils  Tub margarines without trans fats. Light or  reduced-fat mayonnaise and salad dressings (reduced sodium). Avocado. Safflower, olive, or canola oils. Natural peanut or almond butter.  Other  Unsalted popcorn and pretzels. The items listed above may not be a complete list of recommended foods or beverages. Contact your dietitian for more options.  +++++++++++++++++++++++++++++++++++++++++++  WHAT FOODS ARE NOT RECOMMENDED?  Grains/ White flour or wheat flour  White bread. White pasta. White rice. Refined cornbread. Bagels and croissants. Crackers that contain trans fat.  Vegetables  Creamed or fried vegetables. Vegetables in a . Regular canned vegetables. Regular canned tomato sauce and paste. Regular tomato and vegetable juices.  Fruits  Dried fruits. Canned fruit in light or heavy syrup. Fruit juice.  Meat and Other Protein Products  Meat in general - RED mwaet & White meat.  Fatty cuts of meat. Ribs, chicken wings, bacon, sausage, bologna, salami, chitterlings, fatback, hot dogs, bratwurst, and packaged luncheon meats.  Dairy  Whole or 2% milk, cream, half-and-half, and cream cheese. Whole-fat or sweetened yogurt. Full-fat cheeses or blue cheese. Nondairy creamers and whipped toppings. Processed cheese, cheese spreads, or  cheese curds.  Condiments  Onion and garlic salt, seasoned salt, table salt, and sea salt. Canned and packaged gravies. Worcestershire sauce. Tartar sauce. Barbecue sauce. Teriyaki sauce. Soy sauce, including reduced sodium. Steak sauce. Fish sauce. Oyster sauce. Cocktail sauce. Horseradish. Ketchup and mustard. Meat flavorings and tenderizers. Bouillon cubes. Hot sauce. Tabasco sauce. Marinades. Taco seasonings. Relishes.  Fats and Oils Butter, stick margarine, lard, shortening and bacon fat. Coconut, palm kernel, or palm oils. Regular salad dressings.  Pickles and olives. Salted popcorn and pretzels.  The items listed above may not be a complete list of foods and beverages to  avoid.  ++++++++++++++++++++++++++++++++++++++  Preventive Care for Adults  A healthy lifestyle and preventive care can promote health and wellness. Preventive health guidelines for men include the following key practices:  A routine yearly physical is a good way to check with your health care provider about your health and preventative screening. It is a chance to share any concerns and updates on your health and to receive a thorough exam.  Visit your dentist for a routine exam and preventative care every 6 months. Brush your teeth twice a day and floss once a day. Good oral hygiene prevents tooth decay and gum disease.  The frequency of eye exams is based on your age, health, family medical history, use of contact lenses, and other factors. Follow your health care provider's recommendations for frequency of eye exams.  Eat a healthy diet. Foods such as vegetables, fruits, whole grains, low-fat dairy products, and lean protein foods contain the nutrients you need without too many calories. Decrease your intake of foods high in solid fats, added sugars, and salt. Eat the right amount of calories for you.Get information about a proper diet from your health care provider, if necessary.  Regular physical exercise is one of the most important things you can do for your health. Most adults should get at least 150 minutes of moderate-intensity exercise (any activity that increases your heart rate and causes you to sweat) each week. In addition, most adults need muscle-strengthening exercises on 2 or more days a week.  Maintain a healthy weight. The body mass index (BMI) is a screening tool to identify possible weight problems. It provides an estimate of body fat based on height and weight. Your health care provider can find your BMI and can help you achieve or maintain a healthy weight.For adults 20 years and older:  A BMI below 18.5 is considered underweight.  A BMI of 18.5 to 24.9 is  normal.  A BMI of 25 to 29.9 is considered overweight.  A BMI of 30 and above is considered obese.  Maintain normal blood lipids and cholesterol levels by exercising and minimizing your intake of saturated fat. Eat a balanced diet with plenty of fruit and vegetables. Blood tests for lipids and cholesterol should begin at age 20 and be repeated every 5 years. If your lipid or cholesterol levels are high, you are over 50, or you are at high risk for heart disease, you may need your cholesterol levels checked more frequently.Ongoing high lipid and cholesterol levels should be treated with medicines if diet and exercise are not working.  If you smoke, find out from your health care provider how to quit. If you do not use tobacco, do not start.  Lung cancer screening is recommended for adults aged 55-80 years who are at high risk for developing lung cancer because of a history of smoking. A yearly low-dose CT scan of the lungs   is recommended for people who have at least a 30-pack-year history of smoking and are a current smoker or have quit within the past 15 years. A pack year of smoking is smoking an average of 1 pack of cigarettes a day for 1 year (for example: 1 pack a day for 30 years or 2 packs a day for 15 years). Yearly screening should continue until the smoker has stopped smoking for at least 15 years. Yearly screening should be stopped for people who develop a health problem that would prevent them from having lung cancer treatment.  If you choose to drink alcohol, do not have more than 2 drinks per day. One drink is considered to be 12 ounces (355 mL) of beer, 5 ounces (148 mL) of wine, or 1.5 ounces (44 mL) of liquor.  High blood pressure causes heart disease and increases the risk of stroke. Your blood pressure should be checked. Ongoing high blood pressure should be treated with medicines, if weight loss and exercise are not effective.  If you are 45-79 years old, ask your health care  provider if you should take aspirin to prevent heart disease.  Diabetes screening involves taking a blood sample to check your fasting blood sugar level. Testing should be considered at a younger age or be carried out more frequently if you are overweight and have at least 1 risk factor for diabetes.  Colorectal cancer can be detected and often prevented. Most routine colorectal cancer screening begins at the age of 50 and continues through age 75. However, your health care provider may recommend screening at an earlier age if you have risk factors for colon cancer. On a yearly basis, your health care provider may provide home test kits to check for hidden blood in the stool. Use of a small camera at the end of a tube to directly examine the colon (sigmoidoscopy or colonoscopy) can detect the earliest forms of colorectal cancer. Talk to your health care provider about this at age 50, when routine screening begins. Direct exam of the colon should be repeated every 5-10 years through age 75, unless early forms of precancerous polyps or small growths are found.  Screening for abdominal aortic aneurysm (AAA)  are recommended for persons over age 50 who have history of hypertensionor who are current or former smokers.  Talk with your health care provider about prostate cancer screening.  Testicular cancer screening is recommended for adult males. Screening includes self-exam, a health care provider exam, and other screening tests. Consult with your health care provider about any symptoms you have or any concerns you have about testicular cancer.  Use sunscreen. Apply sunscreen liberally and repeatedly throughout the day. You should seek shade when your shadow is shorter than you. Protect yourself by wearing long sleeves, pants, a wide-brimmed hat, and sunglasses year round, whenever you are outdoors.  Once a month, do a whole-body skin exam, using a mirror to look at the skin on your back. Tell your health  care provider about new moles, moles that have irregular borders, moles that are larger than a pencil eraser, or moles that have changed in shape or color.  Stay current with required vaccines (immunizations).  Influenza vaccine. All adults should be immunized every year.  Tetanus, diphtheria, and acellular pertussis (Td, Tdap) vaccine. An adult who has not previously received Tdap or who does not know his vaccine status should receive 1 dose of Tdap. This initial dose should be followed by tetanus and diphtheria toxoids (Td)   booster doses every 10 years. Adults with an unknown or incomplete history of completing a 3-dose immunization series with Td-containing vaccines should begin or complete a primary immunization series including a Tdap dose. Adults should receive a Td booster every 10 years.  Zoster vaccine. One dose is recommended for adults aged 60 years or older unless certain conditions are present.    Pneumococcal 13-valent conjugate (PCV13) vaccine. When indicated, a person who is uncertain of his immunization history and has no record of immunization should receive the PCV13 vaccine. An adult aged 19 years or older who has certain medical conditions and has not been previously immunized should receive 1 dose of PCV13 vaccine. This PCV13 should be followed with a dose of pneumococcal polysaccharide (PPSV23) vaccine. The PPSV23 vaccine dose should be obtained at least 8 weeks after the dose of PCV13 vaccine. An adult aged 19 years or older who has certain medical conditions and previously received 1 or more doses of PPSV23 vaccine should receive 1 dose of PCV13. The PCV13 vaccine dose should be obtained 1 or more years after the last PPSV23 vaccine dose.    Pneumococcal polysaccharide (PPSV23) vaccine. When PCV13 is also indicated, PCV13 should be obtained first. All adults aged 65 years and older should be immunized. An adult younger than age 65 years who has certain medical conditions  should be immunized. Any person who resides in a nursing home or long-term care facility should be immunized. An adult smoker should be immunized. People with an immunocompromised condition and certain other conditions should receive both PCV13 and PPSV23 vaccines. People with human immunodeficiency virus (HIV) infection should be immunized as soon as possible after diagnosis. Immunization during chemotherapy or radiation therapy should be avoided. Routine use of PPSV23 vaccine is not recommended for American Indians, [place] Natives, or people younger than 65 years unless there are medical conditions that require PPSV23 vaccine. When indicated, people who have unknown immunization and have no record of immunization should receive PPSV23 vaccine. One-time revaccination 5 years after the first dose of PPSV23 is recommended for people aged 19-64 years who have chronic kidney failure, nephrotic syndrome, asplenia, or immunocompromised conditions. People who received 1-2 doses of PPSV23 before age 65 years should receive another dose of PPSV23 vaccine at age 65 years or later if at least 5 years have passed since the previous dose. Doses of PPSV23 are not needed for people immunized with PPSV23 at or after age 65 years.  Hepatitis A vaccine. Adults who wish to be protected from this disease, have certain high-risk conditions, work with hepatitis A-infected animals, work in hepatitis A research labs, or travel to or work in countries with a high rate of hepatitis A should be immunized. Adults who were previously unvaccinated and who anticipate close contact with an international adoptee during the first 60 days after arrival in the [place] from a country with a high rate of hepatitis A should be immunized.  Hepatitis B vaccine. Adults should be immunized if they wish to be protected from this disease, have certain high-risk conditions, may be exposed to blood or other infectious body fluids, are household  contacts or sex partners of hepatitis B positive people, are clients or workers in certain care facilities, or travel to or work in countries with a high rate of hepatitis B.  Preventive Service / Frequency  Ages 19 to 39  Blood pressure check.  Lipid and cholesterol check.  Hepatitis C blood test.** / For any individual with known risks   for hepatitis C.  Skin self-exam. / Monthly.  Influenza vaccine. / Every year.  Tetanus, diphtheria, and acellular pertussis (Tdap, Td) vaccine.** / Consult your health care provider. 1 dose of Td every 10 years.  HPV vaccine. / 3 doses over 6 months, if 26 or younger.  Measles, mumps, rubella (MMR) vaccine.** / You need at least 1 dose of MMR if you were born in 1957 or later. You may also need a second dose.  Pneumococcal 13-valent conjugate (PCV13) vaccine.** / Consult your health care provider.  Pneumococcal polysaccharide (PPSV23) vaccine.** / 1 to 2 doses if you smoke cigarettes or if you have certain conditions.  Meningococcal vaccine.** / 1 dose if you are age 19 to 21 years and a first-year college student living in a residence hall, or have one of several medical conditions. You may also need additional booster doses.  Hepatitis A vaccine.** / Consult your health care provider.  Hepatitis B vaccine.** / Consult your health care provider.   

## 2015-07-25 ENCOUNTER — Other Ambulatory Visit: Payer: Self-pay | Admitting: Internal Medicine

## 2015-07-25 DIAGNOSIS — E785 Hyperlipidemia, unspecified: Secondary | ICD-10-CM

## 2015-07-25 LAB — MICROALBUMIN / CREATININE URINE RATIO
Creatinine, Urine: 258 mg/dL (ref 20–370)
Microalb Creat Ratio: 4 mcg/mg creat (ref <30)
Microalb, Ur: 1.1 mg/dL

## 2015-07-25 LAB — URINALYSIS, ROUTINE W REFLEX MICROSCOPIC
BILIRUBIN URINE: NEGATIVE
GLUCOSE, UA: NEGATIVE
Hgb urine dipstick: NEGATIVE
Ketones, ur: NEGATIVE
Leukocytes, UA: NEGATIVE
Nitrite: NEGATIVE
Protein, ur: NEGATIVE
SPECIFIC GRAVITY, URINE: 1.026 (ref 1.001–1.035)
pH: 5.5 (ref 5.0–8.0)

## 2015-07-25 LAB — VITAMIN D 25 HYDROXY (VIT D DEFICIENCY, FRACTURES): Vit D, 25-Hydroxy: 60 ng/mL (ref 30–100)

## 2015-07-25 MED ORDER — ATORVASTATIN CALCIUM 80 MG PO TABS: ORAL_TABLET | ORAL | Status: DC

## 2015-07-26 ENCOUNTER — Other Ambulatory Visit: Payer: Self-pay | Admitting: Internal Medicine

## 2015-07-27 LAB — INSULIN, RANDOM: Insulin: 46 u[IU]/mL — ABNORMAL HIGH (ref 2.0–19.6)

## 2015-08-03 ENCOUNTER — Other Ambulatory Visit: Payer: Self-pay | Admitting: Physician Assistant

## 2015-08-03 DIAGNOSIS — F988 Other specified behavioral and emotional disorders with onset usually occurring in childhood and adolescence: Secondary | ICD-10-CM

## 2015-08-03 MED ORDER — AMPHETAMINE-DEXTROAMPHETAMINE 20 MG PO TABS: ORAL_TABLET | ORAL | Status: DC

## 2015-09-24 ENCOUNTER — Other Ambulatory Visit: Payer: Self-pay | Admitting: Physician Assistant

## 2015-09-24 DIAGNOSIS — F988 Other specified behavioral and emotional disorders with onset usually occurring in childhood and adolescence: Secondary | ICD-10-CM

## 2015-09-24 MED ORDER — AMPHETAMINE-DEXTROAMPHETAMINE 20 MG PO TABS: ORAL_TABLET | ORAL | Status: DC

## 2015-11-16 ENCOUNTER — Other Ambulatory Visit: Payer: Self-pay | Admitting: Physician Assistant

## 2015-11-16 DIAGNOSIS — F988 Other specified behavioral and emotional disorders with onset usually occurring in childhood and adolescence: Secondary | ICD-10-CM

## 2015-11-16 MED ORDER — AMPHETAMINE-DEXTROAMPHETAMINE 20 MG PO TABS
ORAL_TABLET | ORAL | 0 refills | Status: DC
Start: 2015-11-16 — End: 2016-01-05

## 2015-11-26 ENCOUNTER — Telehealth: Payer: Self-pay | Admitting: Physician Assistant

## 2015-11-26 MED ORDER — PREDNISONE 20 MG PO TABS: ORAL_TABLET | ORAL | 0 refills | Status: DC

## 2015-11-26 MED ORDER — BENZONATATE 100 MG PO CAPS: 200.0000 mg | ORAL_CAPSULE | Freq: Three times a day (TID) | ORAL | 0 refills | Status: DC | PRN

## 2015-11-26 NOTE — Telephone Encounter (Signed)
LVM informing pt of Amanda instructions & that if he was not feeling better by Monday he should call the office for an appt.

## 2015-11-26 NOTE — Telephone Encounter (Signed)
Patient is calling with 5 days of sinus congestion, cough worse at night, sore throat, no fever or chills. He states he has family coming into town and is unable to leave work for an appointment.   Will send in prednisone and tessalon pearles, get on allergy pill and flonase/nasonex at night.   If not better can make OV next week for antibiotic.

## 2016-01-05 ENCOUNTER — Other Ambulatory Visit: Payer: Self-pay | Admitting: Physician Assistant

## 2016-01-05 DIAGNOSIS — F988 Other specified behavioral and emotional disorders with onset usually occurring in childhood and adolescence: Secondary | ICD-10-CM

## 2016-01-05 MED ORDER — AMPHETAMINE-DEXTROAMPHETAMINE 20 MG PO TABS: ORAL_TABLET | ORAL | 0 refills | Status: DC

## 2016-01-06 ENCOUNTER — Ambulatory Visit (INDEPENDENT_AMBULATORY_CARE_PROVIDER_SITE_OTHER): Payer: Managed Care, Other (non HMO) | Admitting: Internal Medicine

## 2016-01-06 ENCOUNTER — Encounter: Payer: Self-pay | Admitting: Internal Medicine

## 2016-01-06 VITALS — BP 122/80 | HR 92 | Temp 98.2°F | Resp 16 | Ht 70.5 in

## 2016-01-06 DIAGNOSIS — T148 Other injury of unspecified body region: Secondary | ICD-10-CM | POA: Diagnosis not present

## 2016-01-06 DIAGNOSIS — W57XXXA Bitten or stung by nonvenomous insect and other nonvenomous arthropods, initial encounter: Secondary | ICD-10-CM

## 2016-01-06 MED ORDER — DOXYCYCLINE HYCLATE 100 MG PO CAPS: 100.0000 mg | ORAL_CAPSULE | Freq: Two times a day (BID) | ORAL | 0 refills | Status: DC

## 2016-01-06 MED ORDER — TRIAMCINOLONE ACETONIDE 0.1 % EX CREA: 1.0000 "application " | TOPICAL_CREAM | Freq: Three times a day (TID) | CUTANEOUS | 0 refills | Status: DC

## 2016-01-06 NOTE — Progress Notes (Signed)
   Subjective:    Patient ID: Cole FossaStephen Deharo, male    DOB: 1981/01/29, 35 y.o.   MRN: [id number]  HPI  Patient presents to the office for evaluation of a possible tick bite which occurred on likely Sunday.  He did not see an actual tick attached.  He reports that the tick bite is a little bit red and raised.  He reports that it has been mildly itchy.  He reports that he has also had a twitch in his left eye.  No rash anywhere else.  He reports that he has not had fevers, chills, nausea, or vomiting.  He reports that he felt like he had cold sweats.    Review of Systems  Constitutional: Negative for chills, fatigue and fever.  Gastrointestinal: Negative for abdominal pain, constipation, diarrhea, nausea and vomiting.  Musculoskeletal: Negative for arthralgias and myalgias.  Skin: Positive for rash.       Objective:   Physical Exam  Constitutional: He is oriented to person, place, and time. He appears well-developed and well-nourished. No distress.  HENT:  Head: Normocephalic.  Mouth/Throat: Oropharynx is clear and moist. No oropharyngeal exudate.  Eyes: Conjunctivae are normal. No scleral icterus.  Neck: Normal range of motion. Neck supple. No JVD present. No thyromegaly present.  Cardiovascular: Normal rate, regular rhythm, normal heart sounds and intact distal pulses.  Exam reveals no gallop and no friction rub.   No murmur heard. Pulmonary/Chest: Effort normal and breath sounds normal. No respiratory distress. He has no wheezes. He has no rales. He exhibits no tenderness.  Abdominal: Soft. Bowel sounds are normal. He exhibits no distension and no mass. There is no tenderness. There is no rebound and no guarding.  Musculoskeletal: Normal range of motion.  Lymphadenopathy:    He has no cervical adenopathy.  Neurological: He is alert and oriented to person, place, and time. No cranial nerve deficit. Coordination normal.  Skin: Skin is warm and dry. No rash noted. He is not  diaphoretic.     Psychiatric: He has a normal mood and affect. His behavior is normal. Judgment and thought content normal.  Nursing note and vitals reviewed.   Vitals:   01/06/16 1436  BP: 122/80  Pulse: 92  Resp: 16  Temp: 98.2 F (36.8 C)          Assessment & Plan:   1. Tick bite  - doxycycline (VIBRAMYCIN) 100 MG capsule; Take 1 capsule (100 mg total) by mouth 2 (two) times daily. One po bid x 14 days  Dispense: 28 capsule; Refill: 0 - triamcinolone cream (KENALOG) 0.1 %; Apply 1 application topically 3 (three) times daily.  Dispense: 30 g; Refill: 0

## 2016-01-29 ENCOUNTER — Ambulatory Visit: Payer: Self-pay | Admitting: Internal Medicine

## 2016-02-10 ENCOUNTER — Other Ambulatory Visit: Payer: Self-pay | Admitting: Internal Medicine

## 2016-02-10 DIAGNOSIS — F988 Other specified behavioral and emotional disorders with onset usually occurring in childhood and adolescence: Secondary | ICD-10-CM

## 2016-02-10 MED ORDER — AMPHETAMINE-DEXTROAMPHETAMINE 20 MG PO TABS: ORAL_TABLET | ORAL | 0 refills | Status: DC

## 2016-02-12 ENCOUNTER — Ambulatory Visit: Payer: Self-pay | Admitting: Internal Medicine

## 2016-03-07 ENCOUNTER — Ambulatory Visit (INDEPENDENT_AMBULATORY_CARE_PROVIDER_SITE_OTHER): Payer: Managed Care, Other (non HMO) | Admitting: Internal Medicine

## 2016-03-07 ENCOUNTER — Encounter: Payer: Self-pay | Admitting: Internal Medicine

## 2016-03-07 VITALS — BP 124/78 | HR 78 | Temp 98.2°F | Resp 18 | Ht 70.5 in | Wt 218.0 lb

## 2016-03-07 DIAGNOSIS — Z79899 Other long term (current) drug therapy: Secondary | ICD-10-CM | POA: Diagnosis not present

## 2016-03-07 DIAGNOSIS — E782 Mixed hyperlipidemia: Secondary | ICD-10-CM | POA: Diagnosis not present

## 2016-03-07 DIAGNOSIS — IMO0001 Reserved for inherently not codable concepts without codable children: Secondary | ICD-10-CM

## 2016-03-07 DIAGNOSIS — J00 Acute nasopharyngitis [common cold]: Secondary | ICD-10-CM

## 2016-03-07 DIAGNOSIS — F988 Other specified behavioral and emotional disorders with onset usually occurring in childhood and adolescence: Secondary | ICD-10-CM | POA: Diagnosis not present

## 2016-03-07 LAB — CBC WITH DIFFERENTIAL/PLATELET
BASOS PCT: 1 %
Basophils Absolute: 60 cells/uL (ref 0–200)
EOS PCT: 11 %
Eosinophils Absolute: 660 cells/uL — ABNORMAL HIGH (ref 15–500)
HCT: 44.2 % (ref 38.5–50.0)
HEMOGLOBIN: 15.2 g/dL (ref 13.2–17.1)
LYMPHS ABS: 2400 {cells}/uL (ref 850–3900)
Lymphocytes Relative: 40 %
MCH: 29.7 pg (ref 27.0–33.0)
MCHC: 34.4 g/dL (ref 32.0–36.0)
MCV: 86.3 fL (ref 80.0–100.0)
MONOS PCT: 9 %
MPV: 9.9 fL (ref 7.5–12.5)
Monocytes Absolute: 540 cells/uL (ref 200–950)
NEUTROS ABS: 2340 {cells}/uL (ref 1500–7800)
Neutrophils Relative %: 39 %
PLATELETS: 292 10*3/uL (ref 140–400)
RBC: 5.12 MIL/uL (ref 4.20–5.80)
RDW: 13.1 % (ref 11.0–15.0)
WBC: 6 10*3/uL (ref 3.8–10.8)

## 2016-03-07 LAB — HEPATIC FUNCTION PANEL
ALT: 44 U/L (ref 9–46)
AST: 28 U/L (ref 10–40)
Albumin: 5.1 g/dL (ref 3.6–5.1)
Alkaline Phosphatase: 62 U/L (ref 40–115)
BILIRUBIN DIRECT: 0.1 mg/dL (ref <=0.2)
BILIRUBIN INDIRECT: 0.4 mg/dL (ref 0.2–1.2)
BILIRUBIN TOTAL: 0.5 mg/dL (ref 0.2–1.2)
Total Protein: 7.6 g/dL (ref 6.1–8.1)

## 2016-03-07 LAB — BASIC METABOLIC PANEL WITH GFR
BUN: 9 mg/dL (ref 7–25)
CHLORIDE: 103 mmol/L (ref 98–110)
CO2: 28 mmol/L (ref 20–31)
Calcium: 9.8 mg/dL (ref 8.6–10.3)
Creat: 0.8 mg/dL (ref 0.60–1.35)
GFR, Est African American: >89 mL/min (ref >=60)
Glucose, Bld: 103 mg/dL — ABNORMAL HIGH (ref 65–99)
Potassium: 4.5 mmol/L (ref 3.5–5.3)
SODIUM: 139 mmol/L (ref 135–146)

## 2016-03-07 LAB — LIPID PANEL
CHOLESTEROL: 128 mg/dL (ref <200)
HDL: 44 mg/dL (ref >40)
LDL CALC: 70 mg/dL (ref <100)
TRIGLYCERIDES: 71 mg/dL (ref <150)
Total CHOL/HDL Ratio: 2.9 Ratio (ref <5.0)
VLDL: 14 mg/dL (ref <30)

## 2016-03-07 MED ORDER — AMPHETAMINE-DEXTROAMPHETAMINE 20 MG PO TABS: ORAL_TABLET | ORAL | 0 refills | Status: DC

## 2016-03-07 MED ORDER — PREDNISONE 20 MG PO TABS: ORAL_TABLET | ORAL | 0 refills | Status: DC

## 2016-03-07 MED ORDER — ATORVASTATIN CALCIUM 80 MG PO TABS: ORAL_TABLET | ORAL | 5 refills | Status: DC

## 2016-03-07 NOTE — Progress Notes (Signed)
Assessment and Plan:    Cholesterol: -Continue diet and exercise.  -Check cholesterol.  -recommended diet and exercise -recommended getting set up at a gym or with a personal trainer for accountability  ADD -cont adderall -script post dated for 03/12/16  Cold -prednisone -flonase -cont antihistamine use -delsym if needed   Continue diet and meds as discussed. Further disposition pending results of labs.  HPI 35 y.o. male  presents for 6 month follow up with hyperlipidemia and ADD   He is on cholesterol medication and denies myalgias. His cholesterol is at goal. The cholesterol last visit was:   Lab Results  Component Value Date   CHOL 226 (H) 07/24/2015   HDL 36 (L) 07/24/2015   LDLCALC 147 (H) 07/24/2015   TRIG 215 (H) 07/24/2015   CHOLHDL 6.3 (H) 07/24/2015  He is taking lipitor MWF.  He reports that he has been trying to eat a little bit better.  He reports that he is busy with his job and has been a little bit lazy.    He is doing well on his adderall.  Given increased hours and being shortstaffed at work he has been having to take the adderall twice daily.      He reports that he has been having cold like symptoms.  He reports that he is having some drainage and some sinus pressure.  He is not coughing at all.  He is getting clear sinus drainage.  He reports some yellow and brown sinus congestion.        Current Medications:  Current Outpatient Prescriptions on File Prior to Visit  Medication Sig Dispense Refill  . albuterol (PROVENTIL HFA;VENTOLIN HFA) 108 (90 BASE) MCG/ACT inhaler Inhale into the lungs every 6 (six) hours as needed for wheezing or shortness of breath.    . amphetamine-dextroamphetamine (ADDERALL) 20 MG tablet 1/2 to 1 tablet 2 x daily for ADD 60 tablet 0  . aspirin 81 MG tablet Take 81 mg by mouth daily.    . Cholecalciferol (VITAMIN D PO) Take 10,000 Units by mouth daily.     . Glucosamine-Chondroitin (GLUCOSAMINE CHONDR COMPLEX PO) Take by  mouth 2 (two) times daily.    . montelukast (SINGULAIR) 10 MG tablet take 1 tablet by mouth once daily 90 tablet 3  . multivitamin (ONE-A-DAY MEN'S) TABS tablet Take 1 tablet by mouth daily.    Marland Kitchen. OVER THE COUNTER MEDICATION Allergra tab daily    . OVER THE COUNTER MEDICATION Takes OTC Zyrtec daily    . atorvastatin (LIPITOR) 80 MG tablet Take 1/2 to 1 tablet daily or as directed for Cholesterol 30 tablet 5   No current facility-administered medications on file prior to visit.     Medical History:  Past Medical History:  Diagnosis Date  . ADD (attention deficit disorder)   . Allergy   . Anxiety   . Asthma   . Depression   . Hyperlipidemia   . Hypertension   . Insulin resistance    history of normal A1C but insulin 60, 45  . Vitamin D deficiency     Allergies: No Known Allergies   Review of Systems:  Review of Systems  Constitutional: Negative for chills, fever and malaise/fatigue.  HENT: Positive for congestion. Negative for ear pain and sore throat.   Eyes: Negative.   Respiratory: Positive for cough. Negative for shortness of breath and wheezing.   Cardiovascular: Negative for chest pain, palpitations and leg swelling.  Gastrointestinal: Negative for abdominal pain, blood in stool, constipation,  diarrhea, heartburn and melena.  Genitourinary: Negative.   Skin: Negative.   Neurological: Negative for dizziness, sensory change, loss of consciousness and headaches.  Psychiatric/Behavioral: Negative for depression. The patient is not nervous/anxious and does not have insomnia.     Family history- Review and unchanged  Social history- Review and unchanged  Physical Exam: BP 124/78   Pulse 78   Temp 98.2 F (36.8 C) (Temporal)   Resp 18   Ht 5' 10.5" (1.791 m)   Wt 218 lb (98.9 kg)   BMI 30.84 kg/m  Wt Readings from Last 3 Encounters:  03/07/16 218 lb (98.9 kg)  07/24/15 216 lb 6.4 oz (98.2 kg)  01/16/15 209 lb (94.8 kg)    General Appearance: Well nourished  well developed, in no apparent distress. Eyes: PERRLA, EOMs, conjunctiva no swelling or erythema ENT/Mouth: Ear canals normal without obstruction, swelling, erythma, discharge.  TMs normal bilaterally.  Oropharynx moist, clear, without exudate, or postoropharyngeal swelling. Neck: Supple, thyroid normal,no cervical adenopathy  Respiratory: Respiratory effort normal, Breath sounds clear A&P without rhonchi, wheeze, or rale.  No retractions, no accessory usage. Cardio: RRR with no MRGs. Brisk peripheral pulses without edema.  Abdomen: Soft, + BS,  Non tender, no guarding, rebound, hernias, masses. Musculoskeletal: Full ROM, 5/5 strength, Normal gait Skin: Warm, dry without rashes, lesions, ecchymosis.  Neuro: Awake and oriented X 3, Cranial nerves intact. Normal muscle tone, no cerebellar symptoms. Psych: Normal affect, Insight and Judgment appropriate.    Terri Piedraourtney Forcucci, PA-C 11:46 AM [facility]

## 2016-04-06 ENCOUNTER — Ambulatory Visit: Payer: Self-pay | Admitting: Internal Medicine

## 2016-04-11 ENCOUNTER — Other Ambulatory Visit: Payer: Self-pay | Admitting: Internal Medicine

## 2016-04-11 ENCOUNTER — Other Ambulatory Visit: Payer: Self-pay | Admitting: *Deleted

## 2016-04-11 DIAGNOSIS — F988 Other specified behavioral and emotional disorders with onset usually occurring in childhood and adolescence: Secondary | ICD-10-CM

## 2016-04-11 MED ORDER — AMPHETAMINE-DEXTROAMPHETAMINE 20 MG PO TABS: ORAL_TABLET | ORAL | 0 refills | Status: DC

## 2016-05-09 ENCOUNTER — Other Ambulatory Visit: Payer: Self-pay | Admitting: Internal Medicine

## 2016-05-09 DIAGNOSIS — F988 Other specified behavioral and emotional disorders with onset usually occurring in childhood and adolescence: Secondary | ICD-10-CM

## 2016-05-09 MED ORDER — AMPHETAMINE-DEXTROAMPHETAMINE 20 MG PO TABS: ORAL_TABLET | ORAL | 0 refills | Status: DC

## 2016-06-14 ENCOUNTER — Other Ambulatory Visit: Payer: Self-pay | Admitting: *Deleted

## 2016-06-14 DIAGNOSIS — F988 Other specified behavioral and emotional disorders with onset usually occurring in childhood and adolescence: Secondary | ICD-10-CM

## 2016-06-14 MED ORDER — AMPHETAMINE-DEXTROAMPHETAMINE 20 MG PO TABS: ORAL_TABLET | ORAL | 0 refills | Status: DC

## 2016-07-15 ENCOUNTER — Encounter: Payer: Self-pay | Admitting: Internal Medicine

## 2016-07-15 ENCOUNTER — Ambulatory Visit (INDEPENDENT_AMBULATORY_CARE_PROVIDER_SITE_OTHER): Payer: BLUE CROSS/BLUE SHIELD | Admitting: Internal Medicine

## 2016-07-15 VITALS — BP 122/74 | HR 76 | Temp 98.0°F | Resp 16 | Ht 70.5 in | Wt 218.0 lb

## 2016-07-15 DIAGNOSIS — E782 Mixed hyperlipidemia: Secondary | ICD-10-CM

## 2016-07-15 DIAGNOSIS — J309 Allergic rhinitis, unspecified: Secondary | ICD-10-CM | POA: Diagnosis not present

## 2016-07-15 DIAGNOSIS — J4520 Mild intermittent asthma, uncomplicated: Secondary | ICD-10-CM

## 2016-07-15 DIAGNOSIS — F988 Other specified behavioral and emotional disorders with onset usually occurring in childhood and adolescence: Secondary | ICD-10-CM | POA: Diagnosis not present

## 2016-07-15 MED ORDER — MONTELUKAST SODIUM 10 MG PO TABS: 10.0000 mg | ORAL_TABLET | Freq: Every day | ORAL | 3 refills | Status: DC

## 2016-07-15 MED ORDER — AMPHETAMINE-DEXTROAMPHETAMINE 20 MG PO TABS: ORAL_TABLET | ORAL | 0 refills | Status: DC

## 2016-07-15 MED ORDER — ATORVASTATIN CALCIUM 80 MG PO TABS: ORAL_TABLET | ORAL | 0 refills | Status: DC

## 2016-07-15 MED ORDER — ALBUTEROL SULFATE HFA 108 (90 BASE) MCG/ACT IN AERS: 2.0000 | INHALATION_SPRAY | Freq: Four times a day (QID) | RESPIRATORY_TRACT | 3 refills | Status: AC | PRN

## 2016-07-15 NOTE — Progress Notes (Signed)
Assessment and Plan:     1. Attention deficit disorder, unspecified hyperactivity presence -[place] drug database reviewed -no evidence of abuse currently -recommended drug holidays for better efficacy - amphetamine-dextroamphetamine (ADDERALL) 20 MG tablet; 1/2 to 1 tablet 2 x daily for ADD  Dispense: 60 tablet; Refill: 0  2. Mixed hyperlipidemia -has CPE in 2 months would prefer to defer labs until then - atorvastatin (LIPITOR) 80 MG tablet; Take 1/2 to 1 tablet daily or as directed for Cholesterol  Dispense: 90 tablet; Refill: 0  3.  Allergic rhinitis -well controlled -cont current medication  4.  Asthma -cont current medications  -avoid albuterol but use as needed  Continue diet and meds as discussed. Further disposition pending results of labs.  HPI 36 y.o. male  Presents for follow-up of ADD, asthma, hyperlipidemia, and allergic rhinitis.  He reports that allergies and asthma have both been doing well.  He reports that he has not had to use his albuterol recently.  He is taking his singulair, flonase, and allegra.  He is not currently having issues.  He is taking his adderall.  He reports that it is working well.  No CP, SOB, palpitations, difficulty with appetite or sleep disturbances.  He would like to have a refill.  Work has gotten better as they hired Psychologist, prison and probation servicesappropriate personal.  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  He is taking his cholesterol medication without incident 3 times per week.  He is trying to eat healthy but is not exercising currently.  Now that work is calming down he believes he will be able to do that soon.       Current Medications:  Current Outpatient Prescriptions on File Prior to Visit  Medication Sig Dispense Refill  . albuterol (PROVENTIL HFA;VENTOLIN HFA) 108 (90 BASE) MCG/ACT inhaler Inhale into the lungs every 6 (six) hours as needed for wheezing or shortness of breath.    . amphetamine-dextroamphetamine (ADDERALL) 20 MG  tablet 1/2 to 1 tablet 2 x daily for ADD 60 tablet 0  . aspirin 81 MG tablet Take 81 mg by mouth daily.    Marland Kitchen. atorvastatin (LIPITOR) 80 MG tablet Take 1/2 to 1 tablet daily or as directed for Cholesterol 30 tablet 5  . Cholecalciferol (VITAMIN D PO) Take 10,000 Units by mouth daily.     . Glucosamine-Chondroitin (GLUCOSAMINE CHONDR COMPLEX PO) Take by mouth 2 (two) times daily.    . montelukast (SINGULAIR) 10 MG tablet take 1 tablet by mouth once daily 90 tablet 3  . multivitamin (ONE-A-DAY MEN'S) TABS tablet Take 1 tablet by mouth daily.    Marland Kitchen. OVER THE COUNTER MEDICATION Allergra tab daily    . OVER THE COUNTER MEDICATION Takes OTC Zyrtec daily     No current facility-administered medications on file prior to visit.     Medical History:  Past Medical History:  Diagnosis Date  . ADD (attention deficit disorder)   . Allergy   . Anxiety   . Asthma   . Depression   . Hyperlipidemia   . Hypertension   . Insulin resistance    history of normal A1C but insulin 60, 45  . Vitamin D deficiency     Allergies: No Known Allergies   Review of Systems:  Review of Systems  Constitutional: Negative for chills, fever and malaise/fatigue.  HENT: Negative for congestion, ear pain and sore throat.   Eyes: Negative.   Respiratory: Negative for cough, shortness of breath and wheezing.  Cardiovascular: Negative for chest pain, palpitations and leg swelling.  Gastrointestinal: Negative for abdominal pain, blood in stool, constipation, diarrhea, heartburn and melena.  Genitourinary: Negative.   Skin: Negative.   Neurological: Negative for dizziness, sensory change, loss of consciousness and headaches.  Psychiatric/Behavioral: Negative for depression. The patient is not nervous/anxious and does not have insomnia.     Family history- Review and unchanged  Social history- Review and unchanged  Physical Exam: BP 122/74   Pulse 76   Temp 98 F (36.7 C) (Temporal)   Resp 16   Ht 5' 10.5" (1.791  m)   Wt 218 lb (98.9 kg)   BMI 30.84 kg/m  Wt Readings from Last 3 Encounters:  07/15/16 218 lb (98.9 kg)  03/07/16 218 lb (98.9 kg)  07/24/15 216 lb 6.4 oz (98.2 kg)    General Appearance: Well nourished well developed, in no apparent distress. Eyes: PERRLA, EOMs, conjunctiva no swelling or erythema ENT/Mouth: Ear canals normal without obstruction, swelling, erythma, discharge.  TMs normal bilaterally.  Oropharynx moist, clear, without exudate, or postoropharyngeal swelling. Neck: Supple, thyroid normal,no cervical adenopathy  Respiratory: Respiratory effort normal, Breath sounds clear A&P without rhonchi, wheeze, or rale.  No retractions, no accessory usage. Cardio: RRR with no MRGs. Brisk peripheral pulses without edema.  Abdomen: Soft, + BS,  Non tender, no guarding, rebound, hernias, masses. Musculoskeletal: Full ROM, 5/5 strength, Normal gait Skin: Warm, dry without rashes, lesions, ecchymosis.  Neuro: Awake and oriented X 3, Cranial nerves intact. Normal muscle tone, no cerebellar symptoms. Psych: Normal affect, Insight and Judgment appropriate.    Terri Piedra, PA-C 10:13 AM [facility]

## 2016-08-16 ENCOUNTER — Other Ambulatory Visit: Payer: Self-pay | Admitting: Physician Assistant

## 2016-08-16 DIAGNOSIS — F988 Other specified behavioral and emotional disorders with onset usually occurring in childhood and adolescence: Secondary | ICD-10-CM

## 2016-08-16 MED ORDER — AMPHETAMINE-DEXTROAMPHETAMINE 20 MG PO TABS: ORAL_TABLET | ORAL | 0 refills | Status: DC

## 2016-09-01 ENCOUNTER — Encounter: Payer: Self-pay | Admitting: Internal Medicine

## 2016-09-13 ENCOUNTER — Other Ambulatory Visit: Payer: Self-pay | Admitting: Physician Assistant

## 2016-09-13 DIAGNOSIS — F988 Other specified behavioral and emotional disorders with onset usually occurring in childhood and adolescence: Secondary | ICD-10-CM

## 2016-09-13 MED ORDER — AMPHETAMINE-DEXTROAMPHETAMINE 20 MG PO TABS: ORAL_TABLET | ORAL | 0 refills | Status: DC

## 2016-10-18 ENCOUNTER — Encounter: Payer: Self-pay | Admitting: Physician Assistant

## 2016-10-18 ENCOUNTER — Ambulatory Visit (INDEPENDENT_AMBULATORY_CARE_PROVIDER_SITE_OTHER): Payer: BLUE CROSS/BLUE SHIELD | Admitting: Physician Assistant

## 2016-10-18 DIAGNOSIS — I10 Essential (primary) hypertension: Secondary | ICD-10-CM

## 2016-10-18 DIAGNOSIS — E559 Vitamin D deficiency, unspecified: Secondary | ICD-10-CM

## 2016-10-18 DIAGNOSIS — F988 Other specified behavioral and emotional disorders with onset usually occurring in childhood and adolescence: Secondary | ICD-10-CM | POA: Diagnosis not present

## 2016-10-18 MED ORDER — AMPHETAMINE-DEXTROAMPHETAMINE 20 MG PO TABS: ORAL_TABLET | ORAL | 0 refills | Status: DC

## 2016-10-18 MED ORDER — CITALOPRAM HYDROBROMIDE 40 MG PO TABS: 40.0000 mg | ORAL_TABLET | Freq: Every day | ORAL | 2 refills | Status: DC

## 2016-10-18 NOTE — Progress Notes (Signed)
Subjective:    Patient ID: Cole FossaStephen Rayle, male    DOB: [birth year], 36 y.o.   MRN: [id number]  HPI 36 y.o. obese WM presents with depression. 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 Feels last few weeks he states little things are overwhelming for him. Has been on celexa in the past.   BMI is Body mass index is 31.12 kg/m., he is working on diet and exercise. Wt Readings from Last 3 Encounters:  10/18/16 220 lb (99.8 kg)  07/15/16 218 lb (98.9 kg)  03/07/16 218 lb (98.9 kg)    Blood pressure 130/90, pulse 87, temperature 97.3 F (36.3 C), resp. rate 16, weight 220 lb (99.8 kg).  Medications Current Outpatient Prescriptions on File Prior to Visit  Medication Sig  . albuterol (PROVENTIL HFA;VENTOLIN HFA) 108 (90 Base) MCG/ACT inhaler Inhale 2 puffs into the lungs every 6 (six) hours as needed for wheezing or shortness of breath.  . amphetamine-dextroamphetamine (ADDERALL) 20 MG tablet 1/2 to 1 tablet 2 x daily for ADD  . aspirin 81 MG tablet Take 81 mg by mouth daily.  Marland Kitchen. atorvastatin (LIPITOR) 80 MG tablet Take 1/2 to 1 tablet daily or as directed for Cholesterol  . Cholecalciferol (VITAMIN D PO) Take 10,000 Units by mouth daily.   . Glucosamine-Chondroitin (GLUCOSAMINE CHONDR COMPLEX PO) Take by mouth 2 (two) times daily.  . minoxidil (ROGAINE) 2 % external solution Apply topically 2 (two) times daily.  . montelukast (SINGULAIR) 10 MG tablet Take 1 tablet (10 mg total) by mouth daily.  . multivitamin (ONE-A-DAY MEN'S) TABS tablet Take 1 tablet by mouth daily.  Marland Kitchen. OVER THE COUNTER MEDICATION Allergra tab daily  . OVER THE COUNTER MEDICATION Takes OTC Zyrtec daily   No current facility-administered medications on file prior to visit.     Problem list He has Hypertension; Asthma; Depression, controlled; Vitamin D  deficiency; ADD (attention deficit disorder); Hyperlipidemia; Insulin resistance; and Medication management on his problem list.   Review of Systems  Constitutional: Negative.   HENT: Negative.   Respiratory: Negative.   Cardiovascular: Negative.   Gastrointestinal: Negative.   Genitourinary: Negative.   Musculoskeletal: Negative.   Skin: Negative.   Neurological: Negative.   Psychiatric/Behavioral: Negative for agitation, behavioral problems, confusion, decreased concentration, dysphoric mood, hallucinations, sleep disturbance and suicidal ideas. The patient is nervous/anxious. The patient is not hyperactive.        Objective:   Physical Exam  Constitutional: He is oriented to person, place, and time. He appears well-developed and well-nourished.  HENT:  Head: Normocephalic and atraumatic.  Right Ear: External ear normal.  Left Ear: External ear normal.  Mouth/Throat: Oropharynx is clear and moist.  Eyes: Conjunctivae and EOM are normal. Pupils are equal, round, and reactive to light.  Neck: Normal range of motion. Neck supple.  Cardiovascular: Normal rate, regular rhythm and normal heart sounds.   Pulmonary/Chest: Effort normal and breath sounds normal.  Abdominal: Soft. Bowel sounds are normal.  Musculoskeletal: Normal range of motion.  Neurological: He is alert and oriented to person, place, and time. No cranial nerve deficit.  Skin: Skin is warm and dry.  Psychiatric: He has a normal mood and affect. His behavior is normal.       Assessment & Plan:    Attention deficit disorder, unspecified hyperactivity presence -     amphetamine-dextroamphetamine (ADDERALL) 20 MG  tablet; 1/2 to 1 tablet 2 x daily for ADD  Anxiety -     citalopram (CELEXA) 40 MG tablet; Take 1 tablet (40 mg total) by mouth daily.  Will get labs at CPE Future Appointments Date Time Provider Department Center  11/28/2016 11:00 AM Lucky Cowboy, MD [facility] None

## 2016-10-18 NOTE — Patient Instructions (Signed)
Before you even begin to attack a weight-loss plan, it pays to remember this: You are not fat. You have fat. Losing weight isn't about blame or shame; it's simply another achievement to accomplish. Dieting is like any other skill-you have to buckle down and work at it. As long as you act in a smart, reasonable way, you'll ultimately get where you want to be. Here are some weight loss pearls for you.  1. It's Not a Diet. It's a Lifestyle Thinking of a diet as something you're on and suffering through only for the short term doesn't work. To shed weight and keep it off, you need to make permanent changes to the way you eat. It's OK to indulge occasionally, of course, but if you cut calories temporarily and then revert to your old way of eating, you'll gain back the weight quicker than you can say yo-yo. Use it to lose it. Research shows that one of the best predictors of long-term weight loss is how many pounds you drop in the first month. For that reason, nutritionists often suggest being stricter for the first two weeks of your new eating strategy to build momentum. Cut out added sugar and alcohol and avoid unrefined carbs. After that, figure out how you can reincorporate them in a way that's healthy and maintainable.  2. There's a Right Way to Exercise Working out burns calories and fat and boosts your metabolism by building muscle. But those trying to lose weight are notorious for overestimating the number of calories they burn and underestimating the amount they take in. Unfortunately, your system is biologically programmed to hold on to extra pounds and that means when you start exercising, your body senses the deficit and ramps up its hunger signals. If you're not diligent, you'll eat everything you burn and then some. Use it to lose it. Cardio gets all the exercise glory, but strength and interval training are the real heroes. They help you build lean muscle, which in turn increases your metabolism and  calorie-burning ability 3. Don't Overreact to Mild Hunger Some people have a hard time losing weight because of hunger anxiety. To them, being hungry is bad-something to be avoided at all costs-so they carry snacks with them and eat when they don't need to. Others eat because they're stressed out or bored. While you never want to get to the point of being ravenous (that's when bingeing is likely to happen), a hunger pang, a craving, or the fact that it's 3:00 p.m. should not send you racing for the vending machine or obsessing about the energy bar in your purse. Ideally, you should put off eating until your stomach is growling and it's difficult to concentrate.  Use it to lose it. When you feel the urge to eat, use the HALT method. Ask yourself, Am I really hungry? Or am I angry or anxious, lonely or bored, or tired? If you're still not certain, try the apple test. If you're truly hungry, an apple should seem delicious; if it doesn't, something else is going on. Or you can try drinking water and making yourself busy, if you are still hungry try a healthy snack.  4. Not All Calories Are Created Equal The mechanics of weight loss are pretty simple: Take in fewer calories than you use for energy. But the kind of food you eat makes all the difference. Processed food that's high in saturated fat and refined starch or sugar can cause inflammation that disrupts the hormone signals that tell   your brain you're full. The result: You eat a lot more.  Use it to lose it. Clean up your diet. Swap in whole, unprocessed foods, including vegetables, lean protein, and healthy fats that will fill you up and give you the biggest nutritional bang for your calorie buck. In a few weeks, as your brain starts receiving regular hunger and fullness signals once again, you'll notice that you feel less hungry overall and naturally start cutting back on the amount you eat.  5. Protein, Produce, and Plant-Based Fats Are Your Weight-Loss  [place] Here's why eating the three Ps regularly will help you drop pounds. Protein fills you up. You need it to build lean muscle, which keeps your metabolism humming so that you can torch more fat. People in a weight-loss program who ate double the recommended daily allowance for protein (about 110 grams for a 150-pound woman) lost 70 percent of their weight from fat, while people who ate the RDA lost only about 40 percent, one study found. Produce is packed with filling fiber. "It's very difficult to consume too many calories if you're eating a lot of vegetables. Example: Three cups of broccoli is a lot of food, yet only 93 calories. (Fruit is another story. It can be easy to overeat and can contain a lot of calories from sugar, so be sure to monitor your intake.) Plant-based fats like olive oil and those in avocados and nuts are healthy and extra satiating.  Use it to lose it. Aim to incorporate each of the three Ps into every meal and snack. People who eat protein throughout the day are able to keep weight off, according to a study in the American Journal of Clinical Nutrition. In addition to meat, poultry and seafood, good sources are beans, lentils, eggs, tofu, and yogurt. As for fat, keep portion sizes in check by measuring out salad dressing, oil, and nut butters (shoot for one to two tablespoons). Finally, eat veggies or a little fruit at every meal. People who did that consumed 308 fewer calories but didn't feel any hungrier than when they didn't eat more produce.  7. How You Eat Is As Important As What You Eat In order for your brain to register that you're full, you need to focus on what you're eating. Sit down whenever you eat, preferably at a table. Turn off the TV or computer, put down your phone, and look at your food. Smell it. Chew slowly, and don't put another bite on your fork until you swallow. When women ate lunch this attentively, they consumed 30 percent less when snacking later than  those who listened to an audiobook at lunchtime, according to a study in the [employer] Journal of Nutrition. 8. Weighing Yourself Really Works The scale provides the best evidence about whether your efforts are paying off. Seeing the numbers tick up or down or stagnate is motivation to keep going-or to rethink your approach. A 2015 study at [employer] found that daily weigh-ins helped people lose more weight, keep it off, and maintain that loss, even after two years. Use it to lose it. Step on the scale at the same time every day for the best results. If your weight shoots up several pounds from one weigh-in to the next, don't freak out. Eating a lot of salt the night before or having your period is the likely culprit. The number should return to normal in a day or two. It's a steady climb that you need to do something about.   9. Too Much Stress and Too Little Sleep Are Your Enemies When you're tired and frazzled, your body cranks up the production of cortisol, the stress hormone that can cause carb cravings. Not getting enough sleep also boosts your levels of ghrelin, a hormone associated with hunger, while suppressing leptin, a hormone that signals fullness and satiety. People on a diet who slept only five and a half hours a night for two weeks lost 55 percent less fat and were hungrier than those who slept eight and a half hours, according to a study in the [employer] Association Journal. Use it to lose it. Prioritize sleep, aiming for seven hours or more a night, which research shows helps lower stress. And make sure you're getting quality zzz's. If a snoring spouse or a fidgety cat wakes you up frequently throughout the night, you may end up getting the equivalent of just four hours of sleep, according to a study from [employer]. Keep pets out of the bedroom, and use a white-noise app to drown out snoring. 10. You Will Hit a plateau-And You Can Bust Through It As you slim down, your  body releases much less leptin, the fullness hormone.  If you're not strength training, start right now. Building muscle can raise your metabolism to help you overcome a plateau. To keep your body challenged and burning calories, incorporate new moves and more intense intervals into your workouts or add another sweat session to your weekly routine. Alternatively, cut an extra 100 calories or so a day from your diet. Now that you've lost weight, your body simply doesn't need as much fuel.   

## 2016-11-02 ENCOUNTER — Encounter: Payer: Self-pay | Admitting: Internal Medicine

## 2016-11-16 ENCOUNTER — Other Ambulatory Visit: Payer: Self-pay | Admitting: Physician Assistant

## 2016-11-16 DIAGNOSIS — F988 Other specified behavioral and emotional disorders with onset usually occurring in childhood and adolescence: Secondary | ICD-10-CM

## 2016-11-16 MED ORDER — AMPHETAMINE-DEXTROAMPHETAMINE 20 MG PO TABS: ORAL_TABLET | ORAL | 0 refills | Status: DC

## 2016-11-28 ENCOUNTER — Encounter: Payer: Self-pay | Admitting: Internal Medicine

## 2016-11-28 ENCOUNTER — Ambulatory Visit (INDEPENDENT_AMBULATORY_CARE_PROVIDER_SITE_OTHER): Payer: BLUE CROSS/BLUE SHIELD | Admitting: Internal Medicine

## 2016-11-28 VITALS — BP 132/84 | HR 72 | Temp 97.0°F | Resp 16 | Ht 70.0 in | Wt 216.4 lb

## 2016-11-28 DIAGNOSIS — Z79899 Other long term (current) drug therapy: Secondary | ICD-10-CM

## 2016-11-28 DIAGNOSIS — Z111 Encounter for screening for respiratory tuberculosis: Secondary | ICD-10-CM | POA: Diagnosis not present

## 2016-11-28 DIAGNOSIS — Z0001 Encounter for general adult medical examination with abnormal findings: Secondary | ICD-10-CM

## 2016-11-28 DIAGNOSIS — R5383 Other fatigue: Secondary | ICD-10-CM

## 2016-11-28 DIAGNOSIS — R0989 Other specified symptoms and signs involving the circulatory and respiratory systems: Secondary | ICD-10-CM

## 2016-11-28 DIAGNOSIS — Z0000 Encounter for general adult medical examination without abnormal findings: Secondary | ICD-10-CM

## 2016-11-28 DIAGNOSIS — E559 Vitamin D deficiency, unspecified: Secondary | ICD-10-CM

## 2016-11-28 DIAGNOSIS — E782 Mixed hyperlipidemia: Secondary | ICD-10-CM

## 2016-11-28 DIAGNOSIS — F988 Other specified behavioral and emotional disorders with onset usually occurring in childhood and adolescence: Secondary | ICD-10-CM

## 2016-11-28 DIAGNOSIS — E8881 Metabolic syndrome: Secondary | ICD-10-CM

## 2016-11-28 DIAGNOSIS — Z1211 Encounter for screening for malignant neoplasm of colon: Secondary | ICD-10-CM

## 2016-11-28 DIAGNOSIS — Z1212 Encounter for screening for malignant neoplasm of rectum: Secondary | ICD-10-CM

## 2016-11-28 LAB — HEPATIC FUNCTION PANEL
ALK PHOS: 74 U/L (ref 40–115)
ALT: 33 U/L (ref 9–46)
AST: 25 U/L (ref 10–40)
Albumin: 4.7 g/dL (ref 3.6–5.1)
BILIRUBIN DIRECT: 0.1 mg/dL (ref <=0.2)
BILIRUBIN INDIRECT: 0.3 mg/dL (ref 0.2–1.2)
Total Bilirubin: 0.4 mg/dL (ref 0.2–1.2)
Total Protein: 7.2 g/dL (ref 6.1–8.1)

## 2016-11-28 LAB — CBC WITH DIFFERENTIAL/PLATELET
BASOS PCT: 1 %
Basophils Absolute: 62 cells/uL (ref 0–200)
EOS ABS: 806 {cells}/uL — AB (ref 15–500)
EOS PCT: 13 %
HCT: 43 % (ref 38.5–50.0)
HEMOGLOBIN: 14.6 g/dL (ref 13.2–17.1)
LYMPHS ABS: 2418 {cells}/uL (ref 850–3900)
LYMPHS PCT: 39 %
MCH: 29.4 pg (ref 27.0–33.0)
MCHC: 34 g/dL (ref 32.0–36.0)
MCV: 86.7 fL (ref 80.0–100.0)
MONO ABS: 496 {cells}/uL (ref 200–950)
MPV: 10.2 fL (ref 7.5–12.5)
Monocytes Relative: 8 %
NEUTROS PCT: 39 %
Neutro Abs: 2418 cells/uL (ref 1500–7800)
Platelets: 290 10*3/uL (ref 140–400)
RBC: 4.96 MIL/uL (ref 4.20–5.80)
RDW: 12.7 % (ref 11.0–15.0)
WBC: 6.2 10*3/uL (ref 3.8–10.8)

## 2016-11-28 LAB — BASIC METABOLIC PANEL WITH GFR
BUN: 13 mg/dL (ref 7–25)
CALCIUM: 9.3 mg/dL (ref 8.6–10.3)
CO2: 25 mmol/L (ref 20–32)
Chloride: 100 mmol/L (ref 98–110)
Creat: 0.81 mg/dL (ref 0.60–1.35)
Glucose, Bld: 107 mg/dL — ABNORMAL HIGH (ref 65–99)
POTASSIUM: 4.3 mmol/L (ref 3.5–5.3)
SODIUM: 139 mmol/L (ref 135–146)

## 2016-11-28 LAB — LIPID PANEL
CHOL/HDL RATIO: 3.9 ratio (ref <5.0)
CHOLESTEROL: 137 mg/dL (ref <200)
HDL: 35 mg/dL — AB (ref >40)
LDL CALC: 74 mg/dL (ref <100)
TRIGLYCERIDES: 140 mg/dL (ref <150)
VLDL: 28 mg/dL (ref <30)

## 2016-11-28 LAB — IRON AND TIBC
%SAT: 24 % (ref 15–60)
Iron: 79 ug/dL (ref 50–180)
TIBC: 334 ug/dL (ref 250–425)
UIBC: 255 ug/dL

## 2016-11-28 LAB — TSH: TSH: 0.93 mIU/L (ref 0.40–4.50)

## 2016-11-28 MED ORDER — BUPROPION HCL ER (XL) 300 MG PO TB24: ORAL_TABLET | ORAL | 1 refills | Status: DC

## 2016-11-28 MED ORDER — AMPHETAMINE-DEXTROAMPHETAMINE 20 MG PO TABS: ORAL_TABLET | ORAL | 0 refills | Status: DC

## 2016-11-28 NOTE — Patient Instructions (Signed)

## 2016-11-28 NOTE — Progress Notes (Signed)
[facility]   Cole Lozano, M.D.      Cole Lozano, P.A.-C [facility]                [address]                [place], [place] [id number] Telephone [phone] Telefax [phone]) [phone] Annual  Screening/Preventative Visit  & Comprehensive Evaluation & Examination     This very nice 36 y.o. MWM presents for a Screening/Preventative Visit & comprehensive evaluation and management of multiple medical co-morbidities.  Patient has been followed for labile HTN, Prediabetes, Hyperlipidemia and Vitamin D Deficiency. 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 He also has ADD with marked improvement in focusing and concentration and productivity on Adderall which he tries to drug holiday .      Patient is monitored expectantly for labile HTN predates since 2016 with BP 136/90. Patient's BP has been controlled at home.  Today's BP is at goal - 132/84. Patient denies any cardiac symptoms as chest pain, palpitations, shortness of breath, dizziness or ankle swelling.     [name] is controlled with diet and medications. Patient denies myalgias or other medication SE's. Last lipids were at goal: Lab Results  Component Value Date   CHOL 128 03/07/2016   HDL 44 03/07/2016   LDLCALC 70 03/07/2016   TRIG 71 03/07/2016   CHOLHDL 2.9 03/07/2016      Patient has prediabetes/Insulin resistance (A1c 5.2% / Insulin 94 in 2014)  and patient denies reactive hypoglycemic symptoms, visual blurring, diabetic polys or paresthesias. Last A1c 5.6 was at goal with an elevated insulin 46:      Finally, patient has history of Vitamin D Deficiency and last vitamin D was at goal: Lab Results  Component Value Date   VD25OH 60 07/24/2015   Current Outpatient Prescriptions  on File Prior to Visit  Medication Sig  . albuteroHFA inhaler 2 puffs every 6 hours as needed for wheezing   . ADDERALL 20 MG tablet 1/2 to 1 tablet 2 x daily for ADD  . aspirin 81 MG tablet Take 81 mg by mouth daily.  . Atorvastatin 80 MG tablet Take 1/2 to 1 tab daily  . VITAMIN D  Take 10,000 Units  daily.   . citalopram  40 MG tablet Take 1 tab daily.  . Glucosamine-Chondroitin (GLUCOSAMINE CHONDR  Take  2 x daily.  Marland Kitchen. ROGAINE 2 % ext sol'n Apply topically 2 x daily.  . montelukast 10 MG tablet Take 1 tab daily.  Marygrace Drought. ONE-A-DAY MEN'S Take 1 tab daily.  [name]. Allergra tab  daily  . OTC Zyrtec Takes  daily   No Known Allergies   Past Medical History:  Diagnosis Date  . ADD (attention deficit disorder)   . Allergy   . Anxiety   . Asthma   . Depression   . Hyperlipidemia   . Hypertension   . Insulin resistance    history of normal A1C but insulin 60, 45  . Vitamin D deficiency    Health Maintenance  Topic Date Due  . INFLUENZA VACCINE  11/23/2016  . TETANUS/TDAP  06/14/2023  . HIV Screening  Completed   Immunization History  Administered Date(s) Administered  . PPD Test 06/13/2013, 06/23/2014, 07/24/2015  . Tdap 06/13/2013   Past Surgical History:  Procedure Laterality Date  . ELBOW SURGERY Left 1991   s/p trauma  . HERNIA REPAIR Right 2003   inguinal  . TONSILLECTOMY AND ADENOIDECTOMY     Family History  Problem Relation Age of Onset  . Cancer Mother        skin  . Neurofibromatosis Brother   . Diabetes Other   . Stroke Other    Social History   Social History  . Marital status: Single    Spouse name: N/A  . Number of children: N/A  . Years of education: N/A   Occupational History  . Designer, multimedia   Social History Main Topics  . Smoking status: Never Smoker  . Smokeless tobacco: Never Used  . Alcohol use 0.5 oz/week    1 drink(s) per week  . Drug use: No  . Sexual activity: Yes    ROS Constitutional: Denies fever, chills, weight loss/gain,  headaches, insomnia,  night sweats or change in appetite. Does c/o fatigue. Eyes: Denies redness, blurred vision, diplopia, discharge, itchy or watery eyes.  ENT: Denies discharge, congestion, post nasal drip, epistaxis, sore throat, earache, hearing loss, dental pain, Tinnitus, Vertigo, Sinus pain or snoring.  Cardio: Denies chest pain, palpitations, irregular heartbeat, syncope, dyspnea, diaphoresis, orthopnea, PND, claudication or edema Respiratory: denies cough, dyspnea, DOE, pleurisy, hoarseness, laryngitis or wheezing.  Gastrointestinal: Denies dysphagia, heartburn, reflux, water brash, pain, cramps, nausea, vomiting, bloating, diarrhea, constipation, hematemesis, melena, hematochezia, jaundice or hemorrhoids Genitourinary: Denies dysuria, frequency, urgency, nocturia, hesitancy, discharge, hematuria or flank pain Musculoskeletal: Denies arthralgia, myalgia, stiffness, Jt. Swelling, pain, limp or strain/sprain. Denies Falls. Skin: Denies puritis, rash, hives, warts, acne, eczema or change in skin lesion Neuro: No weakness, tremor, incoordination, spasms, paresthesia or pain Psychiatric: Denies confusion, memory loss or sensory loss. Denies Depression. Endocrine: Denies change in weight, skin, hair change, nocturia, and paresthesia, diabetic polys, visual blurring or hyper / hypo glycemic episodes.  Heme/Lymph: No excessive bleeding, bruising or enlarged lymph nodes.  Physical Exam  BP 132/84   Pulse 72   Temp (!) 97 F (36.1 C)   Resp 16   Ht 5\' 10"  (1.778 m)   Wt 216 lb 6.4 oz (98.2 kg)   BMI 31.05 kg/m   General Appearance: Well nourished and well groomed and in no apparent distress.  Eyes: PERRLA, EOMs, conjunctiva no swelling or erythema, normal fundi and vessels. Sinuses: No frontal/maxillary tenderness ENT/Mouth: EACs patent / TMs  nl. Nares clear without erythema, swelling, mucoid exudates. Oral hygiene is good. No erythema, swelling, or exudate. Tongue normal,  non-obstructing. Tonsils not swollen or erythematous. Hearing normal.  Neck: Supple, thyroid normal. No bruits, nodes or JVD. Respiratory: Respiratory effort normal.  BS equal and clear bilateral without rales, rhonci, wheezing or stridor. Cardio: Heart sounds are normal with regular rate and rhythm and no murmurs, rubs or gallops. Peripheral pulses are normal and equal bilaterally without edema. No aortic or femoral bruits. Chest: symmetric with normal excursions and percussion.  Abdomen: Soft, with Nl bowel sounds. Nontender, no guarding, rebound, hernias, masses, or organomegaly.  Lymphatics: Non tender without lymphadenopathy.  Genitourinary: No hernias.Testes nl. DRE - prostate nl for age - smooth & firm w/o nodules. Musculoskeletal: Full ROM all peripheral extremities, joint stability, 5/5 strength, and normal gait. Skin: Warm and dry without rashes, lesions, cyanosis, clubbing or  ecchymosis.  Neuro: Cranial nerves intact, reflexes equal bilaterally. Normal muscle tone, no cerebellar symptoms. Sensation intact.  Pysch: Alert and oriented X 3 with normal affect, insight and judgment  appropriate.   Assessment and Plan  1. Annual Preventative/Screening Exam   2. Labile hypertension  - Urinalysis, Routine w reflex microscopic - Microalbumin / creatinine urine ratio - CBC with Differential/Platelet - BASIC METABOLIC PANEL WITH GFR - Magnesium - TSH  3. Hyperlipidemia, mixed  - Hepatic function panel - Lipid panel - TSH  4. Insulin resistance  - Hemoglobin A1c - Insulin, random  5. 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 Screening examination for  pulmonary tuberculosis  8. Other fatigue  - Vitamin B12 - Iron and TIBC - Testosterone - CBC with Differential/Platelet  9. Medication management  - Urinalysis, Routine w reflex microscopic - Microalbumin / creatinine urine ratio - CBC with Differential/Platelet - BASIC METABOLIC PANEL WITH GFR - Hepatic function panel - Magnesium - Lipid panel - TSH - Hemoglobin A1c - Insulin, random - VITAMIN D 25 Hydroxy (Vit-D Deficiency, Fractures)  10. Encounter for colorectal cancer screening  - POC Hemoccult Bld/Stl (3-Cd Home Screen); Future       Patient was counseled in prudent diet, weight control to achieve/maintain BMI less than 25, BP monitoring, regular exercise and medications as discussed.  Discussed med effects and SE's. Routine screening labs and tests as requested with regular follow-up as recommended. Over 40 minutes of exam, counseling, chart review and high complex critical decision making was performed

## 2016-11-28 NOTE — Addendum Note (Signed)
Addended by: Valrie HartEVANS, Ayannah Faddis C on: 11/28/2016 01:59 PM   Modules accepted: Orders

## 2016-11-29 LAB — URINALYSIS, ROUTINE W REFLEX MICROSCOPIC
Bilirubin Urine: NEGATIVE
GLUCOSE, UA: NEGATIVE
Hgb urine dipstick: NEGATIVE
Ketones, ur: NEGATIVE
LEUKOCYTES UA: NEGATIVE
Nitrite: NEGATIVE
PH: 6 (ref 5.0–8.0)
Protein, ur: NEGATIVE
SPECIFIC GRAVITY, URINE: 1.017 (ref 1.001–1.035)

## 2016-11-29 LAB — MICROALBUMIN / CREATININE URINE RATIO
Creatinine, Urine: 153 mg/dL (ref 20–370)
MICROALB/CREAT RATIO: 3 ug/mg{creat} (ref <30)
Microalb, Ur: 0.5 mg/dL

## 2016-11-29 LAB — MAGNESIUM: Magnesium: 2.1 mg/dL (ref 1.5–2.5)

## 2016-11-29 LAB — HEMOGLOBIN A1C
Hgb A1c MFr Bld: 5.2 % (ref <5.7)
Mean Plasma Glucose: 103 mg/dL

## 2016-11-29 LAB — VITAMIN B12: VITAMIN B 12: 774 pg/mL (ref 200–1100)

## 2016-11-29 LAB — TESTOSTERONE: Testosterone: 254 ng/dL (ref 250–827)

## 2016-11-29 LAB — INSULIN, RANDOM: Insulin: 20.3 u[IU]/mL — ABNORMAL HIGH (ref 2.0–19.6)

## 2016-11-29 LAB — VITAMIN D 25 HYDROXY (VIT D DEFICIENCY, FRACTURES): Vit D, 25-Hydroxy: 87 ng/mL (ref 30–100)

## 2016-11-30 ENCOUNTER — Other Ambulatory Visit: Payer: Self-pay | Admitting: Physician Assistant

## 2016-12-12 ENCOUNTER — Other Ambulatory Visit: Payer: Self-pay | Admitting: Internal Medicine

## 2016-12-12 DIAGNOSIS — F988 Other specified behavioral and emotional disorders with onset usually occurring in childhood and adolescence: Secondary | ICD-10-CM

## 2016-12-14 ENCOUNTER — Encounter: Payer: Self-pay | Admitting: Internal Medicine

## 2016-12-14 ENCOUNTER — Other Ambulatory Visit: Payer: Self-pay | Admitting: Internal Medicine

## 2016-12-14 ENCOUNTER — Ambulatory Visit (INDEPENDENT_AMBULATORY_CARE_PROVIDER_SITE_OTHER): Payer: BLUE CROSS/BLUE SHIELD | Admitting: Internal Medicine

## 2016-12-14 VITALS — BP 128/76 | HR 64 | Temp 97.9°F | Resp 16 | Ht 70.0 in | Wt 218.8 lb

## 2016-12-14 DIAGNOSIS — L723 Sebaceous cyst: Secondary | ICD-10-CM | POA: Diagnosis not present

## 2016-12-14 DIAGNOSIS — L089 Local infection of the skin and subcutaneous tissue, unspecified: Secondary | ICD-10-CM | POA: Diagnosis not present

## 2016-12-14 MED ORDER — DOXYCYCLINE HYCLATE 100 MG PO CAPS: ORAL_CAPSULE | ORAL | 0 refills | Status: AC

## 2016-12-14 NOTE — Progress Notes (Signed)
  Subjective:    Patient ID: Cole Lozano, male    DOB: 1980/05/27, 36 y.o.   MRN: [id number]  HPI  This nice 36 yo MWM presented acutely this am awakening with a painful "lump" over his posterior lower Rt scalp. Denies injury.  Relates a restless sleep last nite wit feeling hot and having sweats. - No respiratory, GI or Gu sx's. No recent insect bites. Denies rash.   Medication Sig  . albuterol (PROVENTIL HFA;VENTOLIN HFA) 108 (90 Base) MCG/ACT inhaler Inhale 2 puffs into the lungs every 6 (six) hours as needed for wheezing or shortness of breath.  . amphetamine-dextroamphetamine (ADDERALL) 20 MG tablet 1/2 to 1 tablet 2 x daily for ADD  . aspirin 81 MG tablet Take 81 mg by mouth daily.  Marland Kitchen atorvastatin (LIPITOR) 80 MG tablet Take 1/2 to 1 tablet daily or as directed for Cholesterol  . buPROPion (WELLBUTRIN XL) 300 MG 24 hr tablet Take 1 tablet every morning for focus & concentration  . Cholecalciferol (VITAMIN D PO) Take 10,000 Units by mouth daily.   . citalopram (CELEXA) 40 MG tablet TAKE 1 TABLET(40 MG) BY MOUTH DAILY  . Glucosamine-Chondroitin (GLUCOSAMINE CHONDR COMPLEX PO) Take by mouth 2 (two) times daily.  . minoxidil (ROGAINE) 2 % external solution Apply topically 2 (two) times daily.  . montelukast (SINGULAIR) 10 MG tablet Take 1 tablet (10 mg total) by mouth daily.  . multivitamin (ONE-A-DAY MEN'S) TABS tablet Take 1 tablet by mouth daily.  Marland Kitchen OVER THE COUNTER MEDICATION Allergra tab daily  . OVER THE COUNTER MEDICATION Takes OTC Zyrtec daily   No facility-administered medications prior to visit.    No Known Allergies Past Medical History:  Diagnosis Date  . ADD (attention deficit disorder)   . Allergy   . Anxiety   . Asthma   . Depression   . Hyperlipidemia   . Hypertension   . Insulin resistance    history of normal A1C but insulin 60, 45  . Vitamin D deficiency    Review of Systems  10 point systems review negative except as above.    Objective:   Physical  Exam  BP 128/76   Pulse 64   Temp 97.9 F (36.6 C)   Resp 16   Ht 5\' 10"  (1.778 m)   Wt 218 lb 12.8 oz (99.2 kg)   BMI 31.39 kg/m   HEENT - WNL. There is a tender soft tissue thickening ~ 1.5 x 1.5 " along the rt Occipital scalp w/o skin changes or induration.  Neck - supple.  Chest - Clear equal BS. Cor - Nl HS. RRR w/o sig MGR. PP 1(+). No edema. MS- FROM w/o deformities.  Gait Nl. Neuro -  Nl w/o focal abnormalities.    Assessment & Plan:   1. Infected sebaceous cyst  - As patient is traveling "out of town" discussed the possibility of an early infected sebaceous cyst and given the differential of possible scenarios, patient is in agreement to "overtreat" under the assumption that this is an early infected sebaceous cyst, thus     - doxycycline (VIBRAMYCIN) 100 MG capsule; Take 2 capsules Now with food  -  then 1 x / day with food  Dispense: 8 capsule; Refill: 0  - Call or ROV is signs or sx's change.

## 2017-01-17 ENCOUNTER — Other Ambulatory Visit: Payer: Self-pay | Admitting: Physician Assistant

## 2017-01-17 DIAGNOSIS — F988 Other specified behavioral and emotional disorders with onset usually occurring in childhood and adolescence: Secondary | ICD-10-CM

## 2017-01-17 MED ORDER — AMPHETAMINE-DEXTROAMPHETAMINE 20 MG PO TABS: ORAL_TABLET | ORAL | 0 refills | Status: DC

## 2017-01-17 NOTE — Progress Notes (Signed)
Future Appointments Date Time Provider Department Center  03/03/2017 10:15 AM [name], PA-C [facility] None  06/30/2017 10:30 AM Lucky Cowboy, MD [facility] None  01/03/2018 10:00 AM Lucky Cowboy, MD [facility] None

## 2017-01-23 ENCOUNTER — Ambulatory Visit (INDEPENDENT_AMBULATORY_CARE_PROVIDER_SITE_OTHER): Payer: BLUE CROSS/BLUE SHIELD | Admitting: Internal Medicine

## 2017-01-23 VITALS — BP 124/78 | HR 76 | Temp 97.5°F | Resp 18 | Ht 70.0 in | Wt 213.8 lb

## 2017-01-23 DIAGNOSIS — N453 Epididymo-orchitis: Secondary | ICD-10-CM

## 2017-01-23 MED ORDER — DOXYCYCLINE HYCLATE 100 MG PO CAPS: ORAL_CAPSULE | ORAL | 0 refills | Status: DC

## 2017-01-23 MED ORDER — TRAMADOL HCL 50 MG PO TABS: ORAL_TABLET | ORAL | 0 refills | Status: DC

## 2017-01-23 MED ORDER — PREDNISONE 20 MG PO TABS: ORAL_TABLET | ORAL | 0 refills | Status: DC

## 2017-01-23 NOTE — Progress Notes (Signed)
Subjective:    Patient ID: Cole Lozano, male    DOB: 1980/12/02, 36 y.o.   MRN: [id number]  HPI  Patient has a very remote hx/o of RIH repair w / mesh and presents now with 3-4 day hx/o Rt scrotal or testicular pain. He denies trauma. No dysuria, fever, chills or rash.    Medication Sig  . albuterol  HFA inhaler Inhale 2 puffs into the lungs every 6 (six) hours as needed for wheezing or shortness of breath.  . ADDERALL 20 MG tablet 1/2 to 1 tablet 2 x daily for ADD  . aspirin 81 MG tablet Take 81 mg by mouth daily.  Marland Kitchen buPROPion-XL 300 MG  Take 1 tablet every morning for focus & concentration  . CVITAMIN D Take 10,000 Units by mouth daily.   . citalopram 40 MG tablet TAKE 1 TABLET(40 MG) BY MOUTH DAILY  . Glucosamine-Chondroitin  Take by mouth 2 (two) times daily.  . minoxidil  2 % external solution Apply topically 2 (two) times daily.  . montelukast10 MG tablet Take 1 tablet (10 mg total) by mouth daily.  . Multi-Vit /ONE-A-DAY MEN'S  Take 1 tablet by mouth daily.  . OTC Allergra tab daily   Or OTC Zyrtec daily  . Atorvastatin 80 MG tablet Take 1/2 to 1 tablet daily or as directed for Cholesterol   No Known Allergies   Past Medical History:  Diagnosis Date  . ADD (attention deficit disorder)   . Allergy   . Anxiety   . Asthma   . Depression   . Hyperlipidemia   . Hypertension   . Insulin resistance    history of normal A1C but insulin 60, 45  . Vitamin D deficiency    Past Surgical History:  Procedure Laterality Date  . ELBOW SURGERY Left 1991   s/p trauma  . HERNIA REPAIR Right 2003   inguinal  . TONSILLECTOMY AND ADENOIDECTOMY     Review of Systems  10 point systems review negative except as above.    Objective:   Physical Exam  BP 124/78   Pulse 76   Temp (!) 97.5 F (36.4 C)   Resp 18   Ht  (1.778 m)   Wt 213 lb 12.8 oz (97 kg)   BMI 30.68 kg/m   HEENT - WNL. Neck - supple.  Chest - Clear equal BS. Cor - Nl HS. RRR w/o sig MGR. PP 1(+).  No edema. Abd - Neg  GU No hernias. Rt  testicle is 1&1/2 x size and exquisite tender and Rt. Epididymis is swollen and tender. Lt scrotal contents are unremarkable.  MS- FROM w/o deformities.  Gait Nl. Neuro -  Nl w/o focal abnormalities.   Assessment & Plan:   1. Orchitis and epididymitis  - doxycycline (VIBRAMYCIN) 100 MG capsule; Take 1 capsule 2 x / day with food for 10 days, then 1 x /day with food for infection  Dispense: 30 capsule; Refill: 0  - predniSONE (DELTASONE) 20 MG tablet; 1 tab 3 x day for 3 days, then 1 tab 2 x day for 3 days, then 1 tab 1 x day for 5 days  Dispense: 20 tablet; Refill: 0  - traMADol (ULTRAM) 50 MG tablet; Take 1 tablet every 4hrs if needed for pain  Dispense: 30 tablet; Refill: 0  - discussed meds/SE's and if not significantly better in 5 days  To call for Urology referral  - Recc Sitz baths 10-15 minutes 3-4 x / day.

## 2017-01-23 NOTE — Patient Instructions (Addendum)
Orchitis Orchitis is swelling (inflammation) of a testicle caused by infection. Testicles are the male organs that produce sperm. The testicles are held in a fleshy sac (scrotum) located behind the penis. Orchitis usually affects only one testicle, but it can occur in both. The condition can develop suddenly. Orchitis can be caused by many different kinds of bacteria and viruses. What are the causes? Orchitis can be caused by either a bacterial or viral infection. Bacterial Infections  These often occur along with an infection of the coiled tube that collects sperm and sits on top of the testicle (epididymis).  In men who are not sexually active, bacterial orchitis usually starts as a urinary tract infection and spreads to the testicle.  In sexually active men, sexually transmitted infections are the most common cause of bacterial orchitis. These can include: ? Gonorrhea. ? Chlamydia. Viral Infections  Mumps is still the most common cause of viral orchitis, though mumps is now rare in many areas because of vaccination.  Other viruses that can cause orchitis include: ? The chickenpox virus (varicella-zoster virus). ? The virus that causes mononucleosis (Epstein-Barr virus). What increases the risk? Boys and men who have not been vaccinated against mumps are at risk for mumps orchitis. Risk factors for bacterial orchitis include:  Frequent urinary tract infections.  Having had urinary tract surgery.  Using a tube passed through the penis to drain urine (Foley catheter).  An enlarged prostate gland.  What are the signs or symptoms? The most common symptoms of orchitis are swelling and pain in the scrotum. Other signs and symptoms may include:  Feeling generally sick (malaise).  Fever and chills.  Painful urination.  Painful ejaculation.  Blood or discharge from the penis.  Nausea.  Headache.  Fatigue.  How is this diagnosed? Your health care provider may suspect  orchitis if you have a painful, swollen testicle along with other signs and symptoms of the condition. A physical exam will be done. Tests may also be done to help your health care provider make a diagnosis. These may include:  A blood test to check for signs of infection.  A urine test to check for a urinary tract infection.  Using a swab to collect a fluid sample from the tip of the penis to test for sexually transmitted infections.  Taking an image of the testicle using sound waves and a computer (testicular ultrasound).  How is this treated? Treatment of orchitis depends on the cause. For orchitis caused by a bacterial infection, your health care provider will most likely prescribe antibiotic medicines. Bacterial infections usually clear up within a few days. Both viral infections and bacterial infections may be treated with:  Bed rest.  Anti-inflammatory medicines.  Pain medicines.  Elevating the scrotum and applying ice.  Follow these instructions at home:  Rest as directed by your health care provider.  Take medicines only as directed by your health care provider.  If you were prescribed an antibiotic medicine, finish it all even if you start to feel better.  Elevate your scrotum and apply ice as directed: ? Put ice in a plastic bag. ? Place a small towel or pillow between your legs. ? Rest your scrotum on the pillow or towel. ? Place another towel between your skin and the plastic bag. ? Leave the ice on for 20 minutes, 2-3 times a day. Contact a health care provider if:  You have a fever.  Pain and swelling have not gotten better after 3 days. Get help  right away if:  Your pain is getting worse.  The swelling in your testicle gets worse. +++++++++++++++++++++++++++++   Epididymitis Epididymitis is swelling (inflammation) of the epididymis. The epididymis is a cord-like structure that is located along the top and back part of the testicle. It collects and  stores sperm from the testicle. This condition can also cause pain and swelling of the testicle and scrotum. Symptoms usually start suddenly (acute epididymitis). Sometimes epididymitis starts gradually and lasts for a while (chronic epididymitis). This type may be harder to treat. What are the causes? In men 54 and younger, this condition is usually caused by a bacterial infection or sexually transmitted disease (STD), such as:  Gonorrhea.  Chlamydia.  In men 76 and older who do not have anal sex, this condition is usually caused by bacteria from a blockage or abnormalities in the urinary system. These can result from:  Having a tube placed into the bladder (urinary catheter).  Having an enlarged or inflamed prostate gland.  Having recent urinary tract surgery.  In men who have a condition that weakens the body's defense system (immune system), such as HIV, this condition can be caused by:  Other bacteria, including tuberculosis and syphilis.  Viruses.  Fungi.  Sometimes this condition occurs without infection. That may happen if urine flows backward into the epididymis after heavy lifting or straining. What increases the risk? This condition is more likely to develop in men:  Who have unprotected sex with more than one partner.  Who have anal sex.  Who have recently had surgery.  Who have a urinary catheter.  Who have urinary problems.  Who have a suppressed immune system.  What are the signs or symptoms? This condition usually begins suddenly with chills, fever, and pain behind the scrotum and in the testicle. Other symptoms include:  Swelling of the scrotum, testicle, or both.  Pain whenejaculatingor urinating.  Pain in the back or belly.  Nausea.  Itching and discharge from the penis.  Frequent need to pass urine.  Redness and tenderness of the scrotum.  How is this diagnosed? Your health care provider can diagnose this condition based on your  symptoms and medical history. Your health care provider will also do a physical exam to ask about your symptoms and check your scrotum and testicle for swelling, pain, and redness. You may also have other tests, including:  Examination of discharge from the penis.  Urine tests for infections, such as STDs.  Your health care provider may test you for other STDs, including HIV. How is this treated? Treatment for this condition depends on the cause. If your condition is caused by a bacterial infection, oral antibiotic medicine may be prescribed. If the bacterial infection has spread to your blood, you may need to receive IV antibiotics. Nonbacterial epididymitis is treated with home care that includes bed rest and elevation of the scrotum. Surgery may be needed to treat:  Bacterial epididymitis that causes pus to build up in the scrotum (abscess).  Chronic epididymitis that has not responded to other treatments.  Follow these instructions at home: Medicines  Take over-the-counter and prescription medicines only as told by your health care provider.  If you were prescribed an antibiotic medicine, take it as told by your health care provider. Do not stop taking the antibiotic even if your condition improves. Sexual Activity  If your epididymitis was caused by an STD, avoid sexual activity until your treatment is complete.  Inform your sexual partner or partners if  you test positive for an STD. They may need to be treated.Do not engage in sexual activity with your partner or partners until their treatment is completed. General instructions  Return to your normal activities as told by your health care provider. Ask your health care provider what activities are safe for you.  Keep your scrotum elevated and supported while resting. Ask your health care provider if you should wear a scrotal support, such as a jockstrap. Wear it as told by your health care provider.  If directed, apply ice to  the affected area: ? Put ice in a plastic bag. ? Place a towel between your skin and the bag. ? Leave the ice on for 20 minutes, 2-3 times per day.  Try taking a sitz bath to help with discomfort. This is a warm water bath that is taken while you are sitting down. The water should only come up to your hips and should cover your buttocks. Do this 3-4 times per day or as told by your health care provider.  Keep all follow-up visits as told by your health care provider. This is important. Contact a health care provider if:  You have a fever.  Your pain medicine is not helping.  Your pain is getting worse.  Your symptoms do not improve within three days. This information is not intended to replace advice given to you by your health care provider. Make sure you discuss any questions you have with your health care provider. Document Released: 04/08/2000 Document Revised: 09/17/2015 Document Reviewed: 08/27/2014 Elsevier Interactive Patient Education  2018 [employer].

## 2017-01-24 ENCOUNTER — Encounter: Payer: Self-pay | Admitting: Internal Medicine

## 2017-02-20 ENCOUNTER — Other Ambulatory Visit: Payer: Self-pay | Admitting: Physician Assistant

## 2017-02-20 DIAGNOSIS — F988 Other specified behavioral and emotional disorders with onset usually occurring in childhood and adolescence: Secondary | ICD-10-CM

## 2017-02-20 MED ORDER — AMPHETAMINE-DEXTROAMPHETAMINE 20 MG PO TABS: ORAL_TABLET | ORAL | 0 refills | Status: DC

## 2017-02-20 NOTE — Progress Notes (Signed)
Future Appointments Date Time Provider Department Center  03/03/2017 10:15 AM Quentin Mullingollier, Shruti Arrey, PA-C [facility] None  06/30/2017 10:30 AM Lucky CowboyMcKeown, William, MD [facility] None  01/03/2018 10:00 AM Lucky CowboyMcKeown, William, MD [facility] None

## 2017-03-03 ENCOUNTER — Ambulatory Visit: Payer: Self-pay | Admitting: Physician Assistant

## 2017-03-15 ENCOUNTER — Encounter: Payer: Self-pay | Admitting: Physician Assistant

## 2017-03-15 ENCOUNTER — Other Ambulatory Visit: Payer: Self-pay | Admitting: Physician Assistant

## 2017-03-15 ENCOUNTER — Ambulatory Visit (INDEPENDENT_AMBULATORY_CARE_PROVIDER_SITE_OTHER): Payer: BLUE CROSS/BLUE SHIELD | Admitting: Physician Assistant

## 2017-03-15 DIAGNOSIS — F988 Other specified behavioral and emotional disorders with onset usually occurring in childhood and adolescence: Secondary | ICD-10-CM | POA: Diagnosis not present

## 2017-03-15 MED ORDER — AMPHETAMINE-DEXTROAMPHETAMINE 20 MG PO TABS: ORAL_TABLET | ORAL | 0 refills | Status: DC

## 2017-03-15 NOTE — Progress Notes (Signed)
FOLLOW UP  Assessment and Plan:   Hypertension -Continue medication, monitor blood pressure at home. Continue DASH diet.  Reminder to go to the ER if any CP, SOB, nausea, dizziness, severe HA, changes vision/speech, left arm numbness and tingling and jaw pain.  Cholesterol -Continue diet and exercise. Check cholesterol.   ADD Printed out sheet  Depression/anxiety Continue meds  Continue diet and meds as discussed. Further disposition pending results of labs. Over 30 minutes of exam, counseling, chart review, and critical decision making was performed  Future Appointments  Date Time Provider Department Center  06/30/2017 10:30 AM Lucky CowboyMcKeown, William, MD [facility] None  01/03/2018 10:00 AM Lucky CowboyMcKeown, William, MD [facility] None     HPI 36 y.o. male  presents for 3 month follow up on hypertension, cholesterol, prediabetes, and vitamin D deficiency.   His blood pressure has been controlled at home, today their BP is BP: 116/82   He does not workout. He denies chest pain, shortness of breath, dizziness.  He is on 1/2 celexa and wellbutrin and doing well with anxiety/stress.   He  is  on cholesterol medication and denies myalgias. His cholesterol is at goal. The cholesterol last visit was:   Lab Results  Component Value Date   CHOL 137 11/28/2016   HDL 35 (L) 11/28/2016   LDLCALC 74 11/28/2016   TRIG 140 11/28/2016   CHOLHDL 3.9 11/28/2016    He has been working on diet and exercise for prediabetes, and denies paresthesia of the feet, polydipsia and polyuria. Last A1C in the office was:  Lab Results  Component Value Date   HGBA1C 5.2 11/28/2016   Patient is on Vitamin D supplement.   Lab Results  Component Value Date   VD25OH 87 11/28/2016     BMI is Body mass index is 30.96 kg/m., he is working on diet and exercise. Wt Readings from Last 3 Encounters:  03/15/17 215 lb 12.8 oz (97.9 kg)  01/23/17 213 lb 12.8 oz (97 kg)  12/14/16 218 lb 12.8 oz (99.2 kg)     Current  Medications:  Current Outpatient Medications on File Prior to Visit  Medication Sig  . albuterol (PROVENTIL HFA;VENTOLIN HFA) 108 (90 Base) MCG/ACT inhaler Inhale 2 puffs into the lungs every 6 (six) hours as needed for wheezing or shortness of breath.  . amphetamine-dextroamphetamine (ADDERALL) 20 MG tablet 1/2 to 1 tablet 2 x daily for ADD  . aspirin 81 MG tablet Take 81 mg by mouth daily.  Marland Kitchen. buPROPion (WELLBUTRIN XL) 300 MG 24 hr tablet Take 1 tablet every morning for focus & concentration  . Cholecalciferol (VITAMIN D PO) Take 10,000 Units by mouth daily.   . citalopram (CELEXA) 40 MG tablet TAKE 1 TABLET(40 MG) BY MOUTH DAILY  . doxycycline (VIBRAMYCIN) 100 MG capsule Take 1 capsule 2 x / day with food for 10 days, then 1 x /day with food for infection  . Glucosamine-Chondroitin (GLUCOSAMINE CHONDR COMPLEX PO) Take by mouth 2 (two) times daily.  . minoxidil (ROGAINE) 2 % external solution Apply topically 2 (two) times daily.  . montelukast (SINGULAIR) 10 MG tablet Take 1 tablet (10 mg total) by mouth daily.  . multivitamin (ONE-A-DAY MEN'S) TABS tablet Take 1 tablet by mouth daily.  Marland Kitchen. OVER THE COUNTER MEDICATION Allergra tab daily  . OVER THE COUNTER MEDICATION Takes OTC Zyrtec daily  . atorvastatin (LIPITOR) 80 MG tablet Take 1/2 to 1 tablet daily or as directed for Cholesterol  . predniSONE (DELTASONE) 20 MG tablet 1 tab  3 x day for 3 days, then 1 tab 2 x day for 3 days, then 1 tab 1 x day for 5 days (Patient not taking: Reported on 03/15/2017)  . traMADol (ULTRAM) 50 MG tablet Take 1 tablet every 4hrs if needed for pain (Patient not taking: Reported on 03/15/2017)   No current facility-administered medications on file prior to visit.     Medical History:  Past Medical History:  Diagnosis Date  . ADD (attention deficit disorder)   . Allergy   . Anxiety   . Asthma   . Depression   . Hyperlipidemia   . Hypertension   . Insulin resistance    history of normal A1C but insulin 60,  45  . Vitamin D deficiency    Allergies: No Known Allergies   Review of Systems:  Review of Systems  Constitutional: Negative.   HENT: Negative.   Eyes: Negative.   Respiratory: Negative.   Cardiovascular: Negative.   Gastrointestinal: Negative.   Genitourinary: Negative.   Musculoskeletal: Negative.   Skin: Negative.     Family history- Review and unchanged Social history- Review and unchanged Physical Exam: BP 116/82   Pulse 89   Temp (!) 97 F (36.1 C)   Ht 5\' 10"  (1.778 m)   Wt 215 lb 12.8 oz (97.9 kg)   SpO2 97%   BMI 30.96 kg/m  Wt Readings from Last 3 Encounters:  03/15/17 215 lb 12.8 oz (97.9 kg)  01/23/17 213 lb 12.8 oz (97 kg)  12/14/16 218 lb 12.8 oz (99.2 kg)   General Appearance: Well nourished, in no apparent distress. Eyes: PERRLA, EOMs, conjunctiva no swelling or erythema Sinuses: No Frontal/maxillary tenderness ENT/Mouth: Ext aud canals clear, TMs without erythema, bulging. No erythema, swelling, or exudate on post pharynx.  Tonsils not swollen or erythematous. Hearing normal.  Neck: Supple, thyroid normal.  Respiratory: Respiratory effort normal, BS equal bilaterally without rales, rhonchi, wheezing or stridor.  Cardio: RRR with no MRGs. Brisk peripheral pulses without edema.  Abdomen: Soft, + BS,  Non tender, no guarding, rebound, hernias, masses. Lymphatics: Non tender without lymphadenopathy.  Musculoskeletal: Full ROM, 5/5 strength, Normal gait Skin: Warm, dry without rashes, lesions, ecchymosis.  Neuro: Cranial nerves intact. Normal muscle tone, no cerebellar symptoms. Psych: Awake and oriented X 3, normal affect, Insight and Judgment appropriate.    Quentin MullingAmanda Murline Weigel, PA-C 11:29 AM [facility]

## 2017-04-19 ENCOUNTER — Other Ambulatory Visit: Payer: Self-pay | Admitting: *Deleted

## 2017-04-19 ENCOUNTER — Other Ambulatory Visit: Payer: Self-pay | Admitting: Internal Medicine

## 2017-04-19 DIAGNOSIS — E782 Mixed hyperlipidemia: Secondary | ICD-10-CM

## 2017-04-19 MED ORDER — ATORVASTATIN CALCIUM 80 MG PO TABS: ORAL_TABLET | ORAL | 0 refills | Status: DC

## 2017-04-19 NOTE — Telephone Encounter (Signed)
renewal Adderall 20mg 

## 2017-04-20 ENCOUNTER — Other Ambulatory Visit: Payer: Self-pay | Admitting: Internal Medicine

## 2017-04-20 DIAGNOSIS — F988 Other specified behavioral and emotional disorders with onset usually occurring in childhood and adolescence: Secondary | ICD-10-CM

## 2017-04-20 MED ORDER — AMPHETAMINE-DEXTROAMPHETAMINE 20 MG PO TABS: ORAL_TABLET | ORAL | 0 refills | Status: DC

## 2017-05-18 ENCOUNTER — Encounter: Payer: Self-pay | Admitting: Physician Assistant

## 2017-05-18 ENCOUNTER — Ambulatory Visit: Payer: BLUE CROSS/BLUE SHIELD | Admitting: Physician Assistant

## 2017-05-18 VITALS — BP 126/90 | HR 97 | Temp 97.8°F | Resp 18 | Ht 70.0 in | Wt 217.0 lb

## 2017-05-18 DIAGNOSIS — H6501 Acute serous otitis media, right ear: Secondary | ICD-10-CM | POA: Diagnosis not present

## 2017-05-18 DIAGNOSIS — F988 Other specified behavioral and emotional disorders with onset usually occurring in childhood and adolescence: Secondary | ICD-10-CM | POA: Diagnosis not present

## 2017-05-18 MED ORDER — PREDNISONE 20 MG PO TABS: ORAL_TABLET | ORAL | 0 refills | Status: DC

## 2017-05-18 MED ORDER — AZITHROMYCIN 250 MG PO TABS: ORAL_TABLET | ORAL | 1 refills | Status: AC

## 2017-05-18 MED ORDER — AMPHETAMINE-DEXTROAMPHETAMINE 20 MG PO TABS: ORAL_TABLET | ORAL | 0 refills | Status: DC

## 2017-05-18 NOTE — Patient Instructions (Signed)
Make sure you are on an allergy pill, see below for more details. Please take the prednisone as directed below, this is NOT an antibiotic so you do NOT have to finish it. You can take it for a few days and stop it if you are doing better.   Please take the prednisone to help decrease inflammation and therefore decrease symptoms. Take it it with food to avoid GI upset. It can cause increased energy but on the other hand it can make it hard to sleep at night so please take it AT NIGHT WITH DINNER, it takes 8-12 hours to start working so it will NOT affect your sleeping if you take it at night with your food!!  If you are diabetic it will increase your sugars so decrease carbs and monitor your sugars closely.    Can do sudafed as well if it does not make you too anxious  Your ears and sinuses are connected by the eustachian tube. When your sinuses are inflamed, this can close off the tube and cause fluid to collect in your middle ear. This can then cause dizziness, popping, clicking, ringing, and echoing in your ears. This is often NOT an infection and does NOT require antibiotics, it is caused by inflammation so the treatments help the inflammation. This can take a long time to get better so please be patient.  Here are things you can do to help with this: - Try the Flonase or Nasonex. Remember to spray each nostril twice towards the outer part of your eye.  Do not sniff but instead pinch your nose and tilt your head back to help the medicine get into your sinuses.  The best time to do this is at bedtime.Stop if you get blurred vision or nose bleeds.  -While drinking fluids, pinch and hold nose close and swallow, to help open eustachian tubes to drain fluid behind ear drums. -Please pick one of the over the counter allergy medications below and take it once daily for allergies.  It will also help with fluid behind ear drums. Claritin or loratadine cheapest but likely the weakest  Zyrtec or certizine at  night because it can make you sleepy The strongest is allegra or fexafinadine  Cheapest at [facility], [employer], [employer] -can use decongestant over the counter, please do not use if you have high blood pressure or certain heart conditions.   if worsening HA, changes vision/speech, imbalance, weakness go to the ER

## 2017-05-18 NOTE — Progress Notes (Signed)
Subjective:    Patient ID: Cole FossaStephen Altergott, male    DOB: 1980/12/02, 37 y.o.   MRN: [id number]  HPI 37 y.o. WM presents with right ear pain x yesterday.  Slowly started to lose his hearing right ear last night, will hear popping and his hearing will come back occ. No fever, chills, no tinnitus. He has been coughing all week, rhinorrhea, sinus congestion. Took sudafed last night and mucinex DM max all week   Blood pressure 126/90, pulse 97, temperature 97.8 F (36.6 C), resp. rate 18, height 5\' 10"  (1.778 m), weight 217 lb (98.4 kg), SpO2 97 %.  Medications Current Outpatient Medications on File Prior to Visit  Medication Sig  . albuterol (PROVENTIL HFA;VENTOLIN HFA) 108 (90 Base) MCG/ACT inhaler Inhale 2 puffs into the lungs every 6 (six) hours as needed for wheezing or shortness of breath.  . amphetamine-dextroamphetamine (ADDERALL) 20 MG tablet 1/2 to 1 tablet 2 x daily for ADD  . aspirin 81 MG tablet Take 81 mg by mouth daily.  Marland Kitchen. atorvastatin (LIPITOR) 80 MG tablet Take 1/2 to 1 tablet daily or as directed for Cholesterol  . buPROPion (WELLBUTRIN XL) 300 MG 24 hr tablet Take 1 tablet every morning for focus & concentration  . Cholecalciferol (VITAMIN D PO) Take 10,000 Units by mouth daily.   . citalopram (CELEXA) 40 MG tablet TAKE 1 TABLET(40 MG) BY MOUTH DAILY  . Glucosamine-Chondroitin (GLUCOSAMINE CHONDR COMPLEX PO) Take by mouth 2 (two) times daily.  . minoxidil (ROGAINE) 2 % external solution Apply topically 2 (two) times daily.  . montelukast (SINGULAIR) 10 MG tablet Take 1 tablet (10 mg total) by mouth daily.  . multivitamin (ONE-A-DAY MEN'S) TABS tablet Take 1 tablet by mouth daily.  Marland Kitchen. OVER THE COUNTER MEDICATION Allergra tab daily  . OVER THE COUNTER MEDICATION Takes OTC Zyrtec daily  . traMADol (ULTRAM) 50 MG tablet Take 1 tablet every 4hrs if needed for pain   No current facility-administered medications on file prior to visit.     Problem list He has Hypertension;  Asthma; Depression, controlled; Vitamin D deficiency; ADD (attention deficit disorder); Hyperlipidemia; Insulin resistance; and Medication management on their problem list.  Review of Systems     Objective:   Physical Exam  Constitutional: He appears well-developed and well-nourished.  HENT:  Right Ear: Ear canal normal. There is swelling and tenderness. No foreign bodies. No mastoid tenderness. Tympanic membrane is injected, erythematous and bulging. Tympanic membrane is not perforated and not retracted. Tympanic membrane mobility is normal. A middle ear effusion is present. Decreased hearing is noted.  Left Ear: Hearing, external ear and ear canal normal. No mastoid tenderness. Tympanic membrane is not perforated and not erythematous.  No middle ear effusion.  Eyes: Conjunctivae are normal. Pupils are equal, round, and reactive to light.  Neck: Normal range of motion. Neck supple.  Cardiovascular: Normal rate and regular rhythm.  Pulmonary/Chest: Effort normal and breath sounds normal. He has no wheezes.  Lymphadenopathy:    He has no cervical adenopathy.       Assessment & Plan:   Right acute serous otitis media, recurrence not specified -     azithromycin (ZITHROMAX) 250 MG tablet; Take 2 tablets (500 mg) on  Day 1,  followed by 1 tablet (250 mg) once daily on Days 2 through 5. -     predniSONE (DELTASONE) 20 MG tablet; 2 tablets daily for 3 days, 1 tablet daily for 4 days.  Attention deficit disorder (ADD) without hyperactivity -  amphetamine-dextroamphetamine (ADDERALL) 20 MG tablet; 1/2 to 1 tablet 2 x daily for ADD    Future Appointments  Date Time Provider Department Center  06/30/2017 10:30 AM Lucky Cowboy, MD [facility] None  01/03/2018 10:00 AM Lucky Cowboy, MD [facility] None

## 2017-05-28 DIAGNOSIS — J101 Influenza due to other identified influenza virus with other respiratory manifestations: Secondary | ICD-10-CM | POA: Diagnosis not present

## 2017-05-31 ENCOUNTER — Telehealth: Payer: Self-pay | Admitting: Physician Assistant

## 2017-05-31 DIAGNOSIS — J189 Pneumonia, unspecified organism: Secondary | ICD-10-CM | POA: Diagnosis not present

## 2017-05-31 DIAGNOSIS — J101 Influenza due to other identified influenza virus with other respiratory manifestations: Secondary | ICD-10-CM | POA: Diagnosis not present

## 2017-05-31 DIAGNOSIS — R509 Fever, unspecified: Secondary | ICD-10-CM | POA: Diagnosis not present

## 2017-05-31 DIAGNOSIS — R05 Cough: Secondary | ICD-10-CM | POA: Diagnosis not present

## 2017-05-31 NOTE — Telephone Encounter (Signed)
Patient was seen 01/24 for URI/ear infection, given prednisone and zpak, [facility], [facility], given tamiflu, he is on tylenol and advil. States fever at home is 105.9  Can take tylenol and advil together Max tylenol in a day is 3000mg - take 500mg  2 total 3 times a day.  Max advil in a day is 3200mg - take 200mg  four total 3 timess a day  Do cold shower, if fever does not go down go to ER If you are not feeling better make OV here

## 2017-06-02 ENCOUNTER — Other Ambulatory Visit: Payer: Self-pay | Admitting: Internal Medicine

## 2017-06-12 ENCOUNTER — Other Ambulatory Visit: Payer: Self-pay | Admitting: Internal Medicine

## 2017-06-12 DIAGNOSIS — F988 Other specified behavioral and emotional disorders with onset usually occurring in childhood and adolescence: Secondary | ICD-10-CM

## 2017-06-23 ENCOUNTER — Telehealth: Payer: Self-pay | Admitting: Physician Assistant

## 2017-06-23 NOTE — Telephone Encounter (Signed)
Patient request: Adderall renewal to [facility]- [address]

## 2017-06-27 NOTE — Telephone Encounter (Signed)
Message sent to PCP.

## 2017-06-28 ENCOUNTER — Other Ambulatory Visit: Payer: Self-pay | Admitting: Adult Health

## 2017-06-28 DIAGNOSIS — F988 Other specified behavioral and emotional disorders with onset usually occurring in childhood and adolescence: Secondary | ICD-10-CM

## 2017-06-28 MED ORDER — AMPHETAMINE-DEXTROAMPHETAMINE 20 MG PO TABS: ORAL_TABLET | ORAL | 0 refills | Status: DC

## 2017-06-28 NOTE — Progress Notes (Signed)
Patient called to request refill on Adderall; this was provided per request after verification of last fill date.

## 2017-06-29 NOTE — Progress Notes (Signed)
This very nice 37 y.o. MWN presents for 6 month follow up with labile HTN, HLD, Pre-Diabetes and Vitamin D Deficiency. Patient is on Citalopram for anxiety and is also Bupropion and Adderall for ADD.      Patient is followed expectantly for HTN & BP has been controlled at home. Today's BP is at goal - 116/80. Patient has had no complaints of any cardiac type chest pain, palpitations, dyspnea / orthopnea / PND, dizziness, claudication, or dependent edema.     Hyperlipidemia is controlled with diet & meds. Patient denies myalgias or other med SE's. Last Lipids were at goal: Lab Results  Component Value Date   CHOL 137 11/28/2016   HDL 35 (L) 11/28/2016   LDLCALC 74 11/28/2016   TRIG 140 11/28/2016   CHOLHDL 3.9 11/28/2016      Also, the patient has history of Insulin Resistance in 2014 with A1c 5.2% w/Insulin level 94 (Nl<20).  He has not had  symptoms of reactive hypoglycemia, diabetic polys, paresthesias or visual blurring.  Last A1c was at goal: Lab Results  Component Value Date   HGBA1C 5.2 11/28/2016      Further, the patient also has history of Vitamin D Deficiency ("42" on treatment in 2016) and supplements vitamin D without any suspected side-effects. Last vitamin D was at goal: Lab Results  Component Value Date   VD25OH 87 11/28/2016   Current Outpatient Medications on File Prior to Visit  Medication Sig  . albuterol (PROVENTIL HFA;VENTOLIN HFA) 108 (90 Base) MCG/ACT inhaler Inhale 2 puffs into the lungs every 6 (six) hours as needed for wheezing or shortness of breath.  . amphetamine-dextroamphetamine (ADDERALL) 20 MG tablet 1/2 to 1 tablet 2 x daily for ADD  . aspirin 81 MG tablet Take 81 mg by mouth daily.  Marland Kitchen atorvastatin (LIPITOR) 80 MG tablet Take 1/2 to 1 tablet daily or as directed for Cholesterol  . buPROPion (WELLBUTRIN XL) 300 MG 24 hr tablet TAKE 1 TABLET BY MOUTH EVERY MORNING FOR FOCUS AND CONCENTRATION  . Cholecalciferol (VITAMIN D PO) Take 10,000 Units by  mouth daily.   . citalopram (CELEXA) 40 MG tablet TAKE 1 TABLET(40 MG) BY MOUTH DAILY  . Glucosamine-Chondroitin (GLUCOSAMINE CHONDR COMPLEX PO) Take by mouth 2 (two) times daily.  . minoxidil (ROGAINE) 2 % external solution Apply topically 2 (two) times daily.  . montelukast (SINGULAIR) 10 MG tablet Take 1 tablet (10 mg total) by mouth daily.  . multivitamin (ONE-A-DAY MEN'S) TABS tablet Take 1 tablet by mouth daily.  Marland Kitchen OVER THE COUNTER MEDICATION Allergra tab daily  . OVER THE COUNTER MEDICATION Takes OTC Zyrtec daily   No current facility-administered medications on file prior to visit.    No Known Allergies   PMHx:   Past Medical History:  Diagnosis Date  . ADD (attention deficit disorder)   . Allergy   . Anxiety   . Asthma   . Depression   . Hyperlipidemia   . Hypertension   . Insulin resistance    history of normal A1C but insulin 60, 45  . Vitamin D deficiency    Immunization History  Administered Date(s) Administered  . PPD Test 06/13/2013, 06/23/2014, 07/24/2015, 11/28/2016  . Tdap 06/13/2013   Past Surgical History:  Procedure Laterality Date  . ELBOW SURGERY Left 1991   s/p trauma  . HERNIA REPAIR Right 2003   inguinal  . TONSILLECTOMY AND ADENOIDECTOMY     FHx:    Reviewed / unchanged  SHx:  Reviewed / unchanged   Systems Review:  Constitutional: Denies fever, chills, wt changes, headaches, insomnia, fatigue, night sweats, change in appetite. Eyes: Denies redness, blurred vision, diplopia, discharge, itchy, watery eyes.  ENT: Denies discharge, congestion, post nasal drip, epistaxis, sore throat, earache, hearing loss, dental pain, tinnitus, vertigo, sinus pain, snoring.  CV: Denies chest pain, palpitations, irregular heartbeat, syncope, dyspnea, diaphoresis, orthopnea, PND, claudication or edema. Respiratory: denies cough, dyspnea, DOE, pleurisy, hoarseness, laryngitis, wheezing.  Gastrointestinal: Denies dysphagia, odynophagia, heartburn, reflux,  water brash, abdominal pain or cramps, nausea, vomiting, bloating, diarrhea, constipation, hematemesis, melena, hematochezia  or hemorrhoids. Genitourinary: Denies dysuria, frequency, urgency, nocturia, hesitancy, discharge, hematuria or flank pain. Musculoskeletal: Denies arthralgias, myalgias, stiffness, jt. swelling, pain, limping or strain/sprain.  Skin: Denies pruritus, rash, hives, warts, acne, eczema or change in skin lesion(s). Neuro: No weakness, tremor, incoordination, spasms, paresthesia or pain. Psychiatric: Denies confusion, memory loss or sensory loss. Endo: Denies change in weight, skin or hair change.  Heme/Lymph: No excessive bleeding, bruising or enlarged lymph nodes.  Physical Exam  BP 116/80   Pulse 84   Temp (!) 97.4 F (36.3 C)   Resp 16   Wt 215 lb 3.2 oz (97.6 kg)   HC 70" (177.8 cm)   BMI 30.88 kg/m   Appears  well nourished, well groomed  and in no distress.  Eyes: PERRLA, EOMs, conjunctiva no swelling or erythema. Sinuses: No frontal/maxillary tenderness ENT/Mouth: EAC's clear, TM's nl w/o erythema, bulging. Nares clear w/o erythema, swelling, exudates. Oropharynx clear without erythema or exudates. Oral hygiene is good. Tongue normal, non obstructing. Hearing intact.  Neck: Supple. Thyroid not palpable. Car 2+/2+ without bruits, nodes or JVD. Chest: Respirations nl with BS clear & equal w/o rales, rhonchi, wheezing or stridor.  Cor: Heart sounds normal w/ regular rate and rhythm without sig. murmurs, gallops, clicks or rubs. Peripheral pulses normal and equal  without edema.  Abdomen: Soft & bowel sounds normal. Non-tender w/o guarding, rebound, hernias, masses or organomegaly.  Lymphatics: Unremarkable.  Musculoskeletal: Full ROM all peripheral extremities, joint stability, 5/5 strength and normal gait.  Skin: Warm, dry without exposed rashes, lesions or ecchymosis apparent.  Neuro: Cranial nerves intact, reflexes equal bilaterally. Sensory-motor testing  grossly intact. Tendon reflexes grossly intact.  Pysch: Alert & oriented x 3.  Insight and judgement nl & appropriate. No ideations.  Assessment and Plan:  1. Labile hypertension  - Continue medication, monitor blood pressure at home.  - Continue DASH diet. Reminder to go to the ER if any CP,  SOB, nausea, dizziness, severe HA, changes vision/speech.  - CBC with Differential/Platelet - BASIC METABOLIC PANEL WITH GFR - Magnesium - TSH  2. Hyperlipidemia, mixed \ - On Atorvastatin 1/2 tab 3 x / week  - Continue diet/meds, exercise,& lifestyle modifications.  - Continue monitor periodic cholesterol/liver & renal functions   - Hepatic function panel - Lipid panel - TSH  3. Insulin resistance  - Hemoglobin A1c - Insulin, random  4. Vitamin D deficiency  - Continue diet, exercise, lifestyle modifications.  - Monitor appropriate labs. - Continue supplementation.  - VITAMIN D 25 Hydroxy   5. Attention deficit disorder (ADD) without hyperactivity  6. Medication management  - CBC with Differential/Platelet - BASIC METABOLIC PANEL WITH GFR - Hepatic function panel - Magnesium - Lipid panel - TSH - Hemoglobin A1c - Insulin, random - VITAMIN D 25 Hydroxyl         Discussed  regular exercise, BP monitoring, weight control to achieve/maintain BMI less  than 25 and discussed med and SE's. Recommended labs to assess and monitor clinical status with further disposition pending results of labs. Over 30 minutes of exam, counseling, chart review was performed.

## 2017-06-29 NOTE — Patient Instructions (Signed)

## 2017-06-30 ENCOUNTER — Ambulatory Visit (INDEPENDENT_AMBULATORY_CARE_PROVIDER_SITE_OTHER): Payer: BLUE CROSS/BLUE SHIELD | Admitting: Internal Medicine

## 2017-06-30 VITALS — BP 116/80 | HR 84 | Temp 97.4°F | Resp 16 | Wt 215.2 lb

## 2017-06-30 DIAGNOSIS — E349 Endocrine disorder, unspecified: Secondary | ICD-10-CM

## 2017-06-30 DIAGNOSIS — F988 Other specified behavioral and emotional disorders with onset usually occurring in childhood and adolescence: Secondary | ICD-10-CM | POA: Diagnosis not present

## 2017-06-30 DIAGNOSIS — E782 Mixed hyperlipidemia: Secondary | ICD-10-CM

## 2017-06-30 DIAGNOSIS — E559 Vitamin D deficiency, unspecified: Secondary | ICD-10-CM | POA: Diagnosis not present

## 2017-06-30 DIAGNOSIS — R0989 Other specified symptoms and signs involving the circulatory and respiratory systems: Secondary | ICD-10-CM

## 2017-06-30 DIAGNOSIS — Z79899 Other long term (current) drug therapy: Secondary | ICD-10-CM | POA: Diagnosis not present

## 2017-06-30 DIAGNOSIS — R7309 Other abnormal glucose: Secondary | ICD-10-CM | POA: Diagnosis not present

## 2017-06-30 DIAGNOSIS — E8881 Metabolic syndrome: Secondary | ICD-10-CM

## 2017-07-03 LAB — CBC WITH DIFFERENTIAL/PLATELET
BASOS PCT: 0.8 %
Basophils Absolute: 40 cells/uL (ref 0–200)
EOS ABS: 310 {cells}/uL (ref 15–500)
EOS PCT: 6.2 %
HCT: 38.8 % (ref 38.5–50.0)
Hemoglobin: 13.5 g/dL (ref 13.2–17.1)
Lymphs Abs: 2170 cells/uL (ref 850–3900)
MCH: 29.3 pg (ref 27.0–33.0)
MCHC: 34.8 g/dL (ref 32.0–36.0)
MCV: 84.2 fL (ref 80.0–100.0)
MPV: 10.3 fL (ref 7.5–12.5)
Monocytes Relative: 9 %
NEUTROS ABS: 2030 {cells}/uL (ref 1500–7800)
Neutrophils Relative %: 40.6 %
PLATELETS: 304 10*3/uL (ref 140–400)
RBC: 4.61 10*6/uL (ref 4.20–5.80)
RDW: 12.7 % (ref 11.0–15.0)
TOTAL LYMPHOCYTE: 43.4 %
WBC mixed population: 450 cells/uL (ref 200–950)
WBC: 5 10*3/uL (ref 3.8–10.8)

## 2017-07-03 LAB — HEPATIC FUNCTION PANEL
AG Ratio: 1.8 (calc) (ref 1.0–2.5)
ALBUMIN MSPROF: 4.6 g/dL (ref 3.6–5.1)
ALT: 78 U/L — AB (ref 9–46)
AST: 93 U/L — ABNORMAL HIGH (ref 10–40)
Alkaline phosphatase (APISO): 84 U/L (ref 40–115)
BILIRUBIN INDIRECT: 0.7 mg/dL (ref 0.2–1.2)
Bilirubin, Direct: 0.2 mg/dL (ref 0.0–0.2)
Globulin: 2.6 g/dL (calc) (ref 1.9–3.7)
TOTAL PROTEIN: 7.2 g/dL (ref 6.1–8.1)
Total Bilirubin: 0.9 mg/dL (ref 0.2–1.2)

## 2017-07-03 LAB — HEMOGLOBIN A1C
HEMOGLOBIN A1C: 5.5 %{Hb} (ref <5.7)
MEAN PLASMA GLUCOSE: 111 (calc)
eAG (mmol/L): 6.2 (calc)

## 2017-07-03 LAB — BASIC METABOLIC PANEL WITH GFR
BUN: 10 mg/dL (ref 7–25)
CALCIUM: 9.7 mg/dL (ref 8.6–10.3)
CHLORIDE: 101 mmol/L (ref 98–110)
CO2: 27 mmol/L (ref 20–32)
CREATININE: 0.94 mg/dL (ref 0.60–1.35)
GFR, Est African American: 121 mL/min/{1.73_m2} (ref >=60)
GFR, Est Non African American: 105 mL/min/{1.73_m2} (ref >=60)
GLUCOSE: 90 mg/dL (ref 65–99)
Potassium: 3.9 mmol/L (ref 3.5–5.3)
Sodium: 136 mmol/L (ref 135–146)

## 2017-07-03 LAB — LIPID PANEL
CHOLESTEROL: 144 mg/dL (ref <200)
HDL: 40 mg/dL — AB (ref >40)
LDL CHOLESTEROL (CALC): 85 mg/dL
Non-HDL Cholesterol (Calc): 104 mg/dL (calc) (ref <130)
Total CHOL/HDL Ratio: 3.6 (calc) (ref <5.0)
Triglycerides: 96 mg/dL (ref <150)

## 2017-07-03 LAB — VITAMIN D 25 HYDROXY (VIT D DEFICIENCY, FRACTURES): Vit D, 25-Hydroxy: 118 ng/mL — ABNORMAL HIGH (ref 30–100)

## 2017-07-03 LAB — INSULIN, RANDOM: Insulin: 8.1 u[IU]/mL (ref 2.0–19.6)

## 2017-07-03 LAB — TSH: TSH: 1.79 mIU/L (ref 0.40–4.50)

## 2017-07-03 LAB — TESTOSTERONE: Testosterone: 434 ng/dL (ref 250–827)

## 2017-07-03 LAB — MAGNESIUM: MAGNESIUM: 2 mg/dL (ref 1.5–2.5)

## 2017-07-24 ENCOUNTER — Other Ambulatory Visit: Payer: Self-pay | Admitting: *Deleted

## 2017-07-24 MED ORDER — MONTELUKAST SODIUM 10 MG PO TABS: 10.0000 mg | ORAL_TABLET | Freq: Every day | ORAL | 3 refills | Status: DC

## 2017-07-27 ENCOUNTER — Other Ambulatory Visit: Payer: Self-pay | Admitting: Internal Medicine

## 2017-07-27 DIAGNOSIS — F988 Other specified behavioral and emotional disorders with onset usually occurring in childhood and adolescence: Secondary | ICD-10-CM

## 2017-07-27 MED ORDER — AMPHETAMINE-DEXTROAMPHETAMINE 20 MG PO TABS: ORAL_TABLET | ORAL | 0 refills | Status: DC

## 2017-08-24 NOTE — Progress Notes (Signed)
Assessment and Plan:  Cole Lozano was seen today for acute visit and cough.  Diagnoses and all orders for this visit:  Cough Will recheck CXR post CAP, cough appears as a reactive/habitual cough, not having at night. Advised voice rest, throat lozenges.   If below treatment insufficient may consider short course of tramadol or codeine syrup to break cough cycle.  -     predniSONE (DELTASONE) 20 MG tablet; 2 tablets daily for 3 days, 1 tablet daily for 4 days. -     promethazine-dextromethorphan (PROMETHAZINE-DM) 6.25-15 MG/5ML syrup; Take 5 mLs by mouth 4 (four) times daily as needed for cough. -     DG Chest 2 View; Future  Attention deficit disorder (ADD) without hyperactivity Helps with focus, no AE's. The patient was counseled on the addictive nature of the medication and was encouraged to take drug holidays when not needed.  -     amphetamine-dextroamphetamine (ADDERALL) 20 MG tablet; 1/2 to 1 tablet 2 x daily for ADD  Further disposition pending results of labs. Discussed med's effects and SE's.   Over 15 minutes of exam, counseling, chart review, and critical decision making was performed.   Future Appointments  Date Time Provider Department Center  10/06/2017 10:30 AM Judd Gaudier, NP [facility] None  01/03/2018 10:00 AM Lucky Cowboy, MD [facility] None    ------------------------------------------------------------------------------------------------------------------   HPI BP 128/76   Pulse 88   Temp 97.6 F (36.4 C)   Resp 18   Ht  (1.778 m)   Wt 221 lb (100.2 kg)   SpO2 97%   BMI 31.71 kg/m   37 y.o.male with hx of mild intermittent asthma and allergies presents for ongoing cough; he reports he had pneumonia late January/early February (diagnosed at urgent care, confirmed by imaging) which was treated and cleared productive cough, but he reports ongoing residual hacky/nonproductive cough "like a smoker's cough" (patient has not hx of smoking) after which  he reports he feels somewhat short of breath and dizzy. He reports these episodes are only during the day, sleeping well at night. Denies fever/chills, chest pain/pressure, nausea/vomiting. He reports he was taking Delsym/ sudafed for several weeks but did not note any benefit and stopped this. He continues with Singulair, PRN albuterol (denies wheezing, has not used), also takes zyrtec and allegra for severe seasonal allergies. He denies GERD - epigastric burning, reflux.   He also requests refill on ADD medication which is due for refill tomorrow-   Patient is on an ADD medication, he states that the medication is helping and he denies any adverse reactions. He is currently taking 20 mg adderall in the AM, then may take 1/2 tab at lunch and at 3 PM if needed for long work hours.    Past Medical History:  Diagnosis Date  . ADD (attention deficit disorder)   . Allergy   . Anxiety   . Asthma   . Depression   . Hyperlipidemia   . Hypertension   . Insulin resistance    history of normal A1C but insulin 60, 45  . Vitamin D deficiency      Not on File  Current Outpatient Medications on File Prior to Visit  Medication Sig  . albuterol (PROVENTIL HFA;VENTOLIN HFA) 108 (90 Base) MCG/ACT inhaler Inhale 2 puffs into the lungs every 6 (six) hours as needed for wheezing or shortness of breath.  . amphetamine-dextroamphetamine (ADDERALL) 20 MG tablet 1/2 to 1 tablet 2 x daily for ADD  . aspirin 81 MG tablet  Take 81 mg by mouth daily.  Marland Kitchen atorvastatin (LIPITOR) 80 MG tablet Take 1/2 to 1 tablet daily or as directed for Cholesterol  . buPROPion (WELLBUTRIN XL) 300 MG 24 hr tablet TAKE 1 TABLET BY MOUTH EVERY MORNING FOR FOCUS AND CONCENTRATION  . Cholecalciferol (VITAMIN D PO) Take 10,000 Units by mouth daily.   . citalopram (CELEXA) 40 MG tablet TAKE 1 TABLET(40 MG) BY MOUTH DAILY  . Glucosamine-Chondroitin (GLUCOSAMINE CHONDR COMPLEX PO) Take by mouth 2 (two) times daily.  . minoxidil (ROGAINE) 2  % external solution Apply topically 2 (two) times daily.  . montelukast (SINGULAIR) 10 MG tablet Take 1 tablet (10 mg total) by mouth daily.  . multivitamin (ONE-A-DAY MEN'S) TABS tablet Take 1 tablet by mouth daily.  Marland Kitchen OVER THE COUNTER MEDICATION Allergra tab daily  . OVER THE COUNTER MEDICATION Takes OTC Zyrtec daily   No current facility-administered medications on file prior to visit.     ROS: Review of Systems  Constitutional: Negative for chills, diaphoresis, fever, malaise/fatigue and weight loss.  HENT: Negative for congestion and sore throat.   Eyes: Positive for blurred vision (After coughing). Negative for double vision.  Respiratory: Positive for cough. Negative for hemoptysis, sputum production, shortness of breath and wheezing.   Cardiovascular: Negative for chest pain, palpitations, orthopnea, claudication, leg swelling and PND.  Gastrointestinal: Negative for diarrhea, heartburn, nausea and vomiting.  Musculoskeletal: Negative for falls, joint pain and myalgias.  Skin: Negative for rash.  Neurological: Positive for dizziness (After severe coughing). Negative for headaches.  Endo/Heme/Allergies: Positive for environmental allergies.  Psychiatric/Behavioral: Negative for depression. The patient is not nervous/anxious and does not have insomnia.      Physical Exam:  BP 128/76   Pulse 88   Temp 97.6 F (36.4 C)   Resp 18   Ht  (1.778 m)   Wt 221 lb (100.2 kg)   SpO2 97%   BMI 31.71 kg/m   General Appearance: Well nourished, in no apparent distress. Eyes: PERRLA, EOMs, conjunctiva no swelling or erythema Sinuses: No Frontal/maxillary tenderness ENT/Mouth: Ext aud canals clear, TMs without erythema, bulging. No erythema, swelling, or exudate on post pharynx.  Tonsils not swollen or erythematous. Hearing normal.  Neck: Supple, thyroid normal.  Respiratory: Respiratory effort normal, BS equal bilaterally without rales, rhonchi, wheezing or stridor, though  somewhat coarse throughout.  Cardio: RRR with no MRGs. Brisk peripheral pulses without edema.  Abdomen: Soft, + BS.  Non tender, no guarding, rebound, hernias, masses. Lymphatics: Non tender without lymphadenopathy.  Musculoskeletal: Full ROM, 5/5 strength, normal gait.  Skin: Warm, dry without rashes, lesions, ecchymosis.  Neuro: Cranial nerves intact. Normal muscle tone, no cerebellar symptoms. Sensation intact.  Psych: Awake and oriented X 3, normal affect, Insight and Judgment appropriate.     Dan Maker, NP 10:13 AM [facility]

## 2017-08-25 ENCOUNTER — Encounter: Payer: Self-pay | Admitting: Adult Health

## 2017-08-25 ENCOUNTER — Ambulatory Visit (HOSPITAL_COMMUNITY)
Admission: RE | Admit: 2017-08-25 | Discharge: 2017-08-25 | Disposition: A | Discharge status: Home / Self Care | Payer: Self-pay | Source: Ambulatory Visit | Attending: Adult Health | Admitting: Adult Health

## 2017-08-25 ENCOUNTER — Ambulatory Visit (INDEPENDENT_AMBULATORY_CARE_PROVIDER_SITE_OTHER): Payer: BLUE CROSS/BLUE SHIELD | Admitting: Adult Health

## 2017-08-25 VITALS — BP 128/76 | HR 88 | Temp 97.6°F | Resp 18 | Ht 70.0 in | Wt 221.0 lb

## 2017-08-25 DIAGNOSIS — R05 Cough: Secondary | ICD-10-CM | POA: Diagnosis not present

## 2017-08-25 DIAGNOSIS — F988 Other specified behavioral and emotional disorders with onset usually occurring in childhood and adolescence: Secondary | ICD-10-CM | POA: Diagnosis not present

## 2017-08-25 DIAGNOSIS — R059 Cough, unspecified: Secondary | ICD-10-CM

## 2017-08-25 DIAGNOSIS — Z8701 Personal history of pneumonia (recurrent): Secondary | ICD-10-CM | POA: Insufficient documentation

## 2017-08-25 MED ORDER — PROMETHAZINE-DM 6.25-15 MG/5ML PO SYRP: 5.0000 mL | ORAL_SOLUTION | Freq: Four times a day (QID) | ORAL | 1 refills | Status: DC | PRN

## 2017-08-25 MED ORDER — PREDNISONE 20 MG PO TABS: ORAL_TABLET | ORAL | 0 refills | Status: DC

## 2017-08-25 MED ORDER — AMPHETAMINE-DEXTROAMPHETAMINE 20 MG PO TABS
ORAL_TABLET | ORAL | 0 refills | Status: DC
Start: 2017-08-25 — End: 2017-09-21

## 2017-08-25 NOTE — Patient Instructions (Signed)
Common causes of cough OR hoarseness OR sore throat:   Allergies, Viral Infections, Acid Reflux and Bacterial Infections.   Allergies and viral infections cause a cough OR sore throat by post nasal drip and are often worse at night, can also have sneezing, lower grade fevers, clear/yellow mucus. This is best treated with allergy medications or nasal sprays.  Please get on allegra for 1-2 weeks The strongest is allegra or fexafinadine  Cheapest at [employer], [employer], [employer]   Bacterial infections are more severe than allergies or viral infections with fever, teeth pain, fatigue. This can be treated with prednisone and the same over the counter medication and after 7 days can be treated with an antibiotic.   Silent reflux/GERD can cause a cough OR sore throat OR hoarseness WITHOUT heart burn because the esophagus that goes to the stomach and trachea that goes to the lungs are very close and when you lay down the acid can irritate your throat and lungs. This can cause hoarseness, cough, and wheezing. Please stop any alcohol or anti-inflammatories like aleve/advil/ibuprofen and start an over the counter Prilosec or omeprazole 1-2 times daily 30mins before food for 2 weeks, then switch to over the counter zantac/ratinidine or pepcid/famotadine once at night for 2 weeks.    sometimes irritation causes more irritation. Try voice rest, use sugar free cough drops to prevent coughing, and try to stop clearing your throat.   If you ever have a cough that does not go away after trying these things please make a follow up visit for further evaluation or we can refer you to a specialist. Or if you ever have shortness of breath or chest pain go to the ER.    

## 2017-09-21 ENCOUNTER — Other Ambulatory Visit: Payer: Self-pay

## 2017-09-21 DIAGNOSIS — F988 Other specified behavioral and emotional disorders with onset usually occurring in childhood and adolescence: Secondary | ICD-10-CM

## 2017-09-21 MED ORDER — AMPHETAMINE-DEXTROAMPHETAMINE 20 MG PO TABS: ORAL_TABLET | ORAL | 0 refills | Status: DC

## 2017-09-21 NOTE — Telephone Encounter (Signed)
Refill request for Adderall,  tablets, #60 for 30 days. PMP checked, last filled on 08/25/2017. Last office visit on 08/25/2017. Next office visit on 10/06/2017

## 2017-09-29 ENCOUNTER — Other Ambulatory Visit: Payer: Self-pay | Admitting: Internal Medicine

## 2017-09-29 DIAGNOSIS — E782 Mixed hyperlipidemia: Secondary | ICD-10-CM

## 2017-10-06 ENCOUNTER — Ambulatory Visit: Payer: Self-pay | Admitting: Adult Health

## 2017-10-19 ENCOUNTER — Other Ambulatory Visit: Payer: Self-pay | Admitting: Internal Medicine

## 2017-10-19 DIAGNOSIS — F988 Other specified behavioral and emotional disorders with onset usually occurring in childhood and adolescence: Secondary | ICD-10-CM

## 2017-10-19 MED ORDER — AMPHETAMINE-DEXTROAMPHETAMINE 20 MG PO TABS: ORAL_TABLET | ORAL | 0 refills | Status: DC

## 2017-11-22 ENCOUNTER — Other Ambulatory Visit: Payer: Self-pay

## 2017-11-22 DIAGNOSIS — F988 Other specified behavioral and emotional disorders with onset usually occurring in childhood and adolescence: Secondary | ICD-10-CM

## 2017-11-22 MED ORDER — AMPHETAMINE-DEXTROAMPHETAMINE 20 MG PO TABS: ORAL_TABLET | ORAL | 0 refills | Status: DC

## 2017-11-22 NOTE — Telephone Encounter (Addendum)
Adderall refill request. PMP checked, last filled on 10/20/17. Last office visit on 08/25/17, Next office visit is 01/03/18. In que for your review.

## 2017-12-27 ENCOUNTER — Other Ambulatory Visit: Payer: Self-pay | Admitting: Physician Assistant

## 2017-12-27 DIAGNOSIS — F988 Other specified behavioral and emotional disorders with onset usually occurring in childhood and adolescence: Secondary | ICD-10-CM

## 2017-12-27 MED ORDER — AMPHETAMINE-DEXTROAMPHETAMINE 20 MG PO TABS: ORAL_TABLET | ORAL | 0 refills | Status: DC

## 2018-01-03 ENCOUNTER — Encounter: Payer: Self-pay | Admitting: Internal Medicine

## 2018-01-23 ENCOUNTER — Other Ambulatory Visit: Payer: Self-pay | Admitting: Internal Medicine

## 2018-01-23 DIAGNOSIS — F988 Other specified behavioral and emotional disorders with onset usually occurring in childhood and adolescence: Secondary | ICD-10-CM

## 2018-01-23 MED ORDER — AMPHETAMINE-DEXTROAMPHETAMINE 20 MG PO TABS: ORAL_TABLET | ORAL | 0 refills | Status: DC

## 2018-02-08 ENCOUNTER — Encounter: Payer: Self-pay | Admitting: Internal Medicine

## 2018-02-08 DIAGNOSIS — E349 Endocrine disorder, unspecified: Secondary | ICD-10-CM | POA: Insufficient documentation

## 2018-02-08 NOTE — Patient Instructions (Signed)

## 2018-02-08 NOTE — Progress Notes (Signed)
[facility]   Lucky Cowboy, M.D.     Dyanne Carrel. Steffanie Dunn, P.A.-C Judd Gaudier, DNP [facility]                [address]                [place], [place]. [address] Telephone [phone] Telefax [phone] Annual  Screening/Preventative Visit  & Comprehensive Evaluation & Examination     This very nice 37 y.o. MWM presents for a Screening /Preventative Visit & comprehensive evaluation and management of multiple medical co-morbidities.  Patient has been followed expectantly for elevated BP, HLD, Prediabetes and Vitamin D Deficiency. Patient is on Citalopram for anxiety and is also Bupropion and Adderall for ADD with apparent good results in helping focus & concentration.      Patient has been monitored since 2016 with elevated BP 136/90 in 2016.  Patient's BP has been controlled at home.  Today's BP is sl elevated at 136/92.  He reports home BP's usu range about 120's/70-80. Patient denies any cardiac symptoms as chest pain, palpitations, shortness of breath, dizziness or ankle swelling.     [name] is controlled with diet and atorvastatin . Patient denies myalgias or other medication SE's. Last lipids were at goal: Lab Results  Component Value Date   CHOL 145 02/09/2018   HDL 37 (L) 02/09/2018   LDLCALC 81 02/09/2018   TRIG 172 (H) 02/09/2018   CHOLHDL 3.9 02/09/2018      Patient has hx/o prediabetes / insulin resistance (A1c 5.2%Insulin 94 in 2014 and A1c 5.6%/Insulin 46 in 2017) and patient denies reactive hypoglycemic symptoms, visual blurring, diabetic polys or paresthesias. Last A1c was 5.5% /Insulin 8.1: Lab Results  Component Value Date   HGBA1C 5.5 02/09/2018       Patientyhas hx/o Low Testosterone "283" in Mar 2017.      Finally, patient has history of Vitamin D Deficiency of    and last vitamin D was  Lab Results  Component Value Date   VD25OH 115 (H) 02/09/2018   Current  Outpatient Medications on File Prior to Visit  Medication Sig  . albuterol (PROVENTIL HFA;VENTOLIN HFA) 108 (90 Base) MCG/ACT inhaler Inhale 2 puffs into the lungs every 6 (six) hours as needed for wheezing or shortness of breath.  . amphetamine-dextroamphetamine (ADDERALL) 20 MG tablet 1/2 to 1 tablet 2 x daily for ADD, should not take daily to avoid tolerance/addiction, script should last much longer than 30 days.  Marland Kitchen aspirin 81 MG tablet Take 81 mg by mouth daily.  Marland Kitchen atorvastatin (LIPITOR) 80 MG tablet TAKE 1/2 TO 1 TABLET BY MOUTH DAILY OR AS DIRECTED FOR CHOLESTEROL  . buPROPion (WELLBUTRIN XL) 300 MG 24 hr tablet TAKE 1 TABLET BY MOUTH EVERY MORNING FOR FOCUS AND CONCENTRATION  . Cholecalciferol (VITAMIN D PO) Take 10,000 Units by mouth daily.   . citalopram (CELEXA) 40 MG tablet TAKE 1 TABLET(40 MG) BY MOUTH DAILY  . Glucosamine-Chondroitin (GLUCOSAMINE CHONDR COMPLEX PO) Take by mouth 2 (two) times daily.  . minoxidil (ROGAINE) 2 % external solution Apply topically 2 (two) times daily.  . montelukast (SINGULAIR) 10 MG tablet Take 1 tablet (10 mg total) by mouth daily.  . multivitamin (ONE-A-DAY MEN'S) TABS tablet Take 1 tablet by mouth daily.  Marland Kitchen OVER THE COUNTER MEDICATION Allergra tab daily  . OVER THE COUNTER MEDICATION Takes OTC Zyrtec daily   No current facility-administered medications on file prior to visit.  Not on File Past Medical History:  Diagnosis Date  . ADD (attention deficit disorder)   . Allergy   . Anxiety   . Asthma   . Depression   . Hyperlipidemia   . Hypertension   . Insulin resistance    history of normal A1C but insulin 60, 45  . Vitamin D deficiency    Health Maintenance  Topic Date Due  . TETANUS/TDAP  06/14/2023  . INFLUENZA VACCINE  Completed  . HIV Screening  Completed   Immunization History  Administered Date(s) Administered  . Influenza Inj Mdck Quad With Preservative 02/09/2018  . PPD Test 06/13/2013, 06/23/2014, 07/24/2015, 11/28/2016,  02/09/2018  . Tdap 06/13/2013    Past Surgical History:  Procedure Laterality Date  . ELBOW SURGERY Left 1991   s/p trauma  . HERNIA REPAIR Right 2003   inguinal  . TONSILLECTOMY AND ADENOIDECTOMY     Family History  Problem Relation Age of Onset  . Cancer Mother        skin  . Neurofibromatosis Brother   . Diabetes Other   . Stroke Other    Social History   Socioeconomic History  . Marital status: Married / wife Luther Parody  Occupational History  . 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  Tobacco Use  . Smoking status: Never Smoker  . Smokeless tobacco: Never Used  Substance and Sexual Activity  . Alcohol use: Yes    Alcohol/week: 1.0 standard drinks    Types: 1 drink(s) per week  . Drug use: No  . Sexual activity: Yes    ROS Constitutional: Denies fever, chills, weight loss/gain, headaches, insomnia,  night sweats or change in appetite. Does c/o fatigue. Eyes: Denies redness, blurred vision, diplopia, discharge, itchy or watery eyes.  ENT: Denies discharge, congestion, post nasal drip, epistaxis, sore throat, earache, hearing loss, dental pain, Tinnitus, Vertigo, Sinus pain or snoring.  Cardio: Denies chest pain, palpitations, irregular heartbeat, syncope, dyspnea, diaphoresis, orthopnea, PND, claudication or edema Respiratory: denies cough, dyspnea, DOE, pleurisy, hoarseness, laryngitis or wheezing.  Gastrointestinal: Denies dysphagia, heartburn, reflux, water brash, pain, cramps, nausea, vomiting, bloating, diarrhea, constipation, hematemesis, melena, hematochezia, jaundice or hemorrhoids Genitourinary: Denies dysuria, frequency, urgency, nocturia, hesitancy, discharge, hematuria or flank pain Musculoskeletal: Denies arthralgia, myalgia, stiffness, Jt. Swelling, pain, limp or strain/sprain. Denies Falls. Skin: Denies puritis, rash, hives, warts, acne, eczema or change in skin lesion Neuro: No weakness, tremor, incoordination, spasms, paresthesia or  pain Psychiatric: Denies confusion, memory loss or sensory loss. Denies Depression. Endocrine: Denies change in weight, skin, hair change, nocturia, and paresthesia, diabetic polys, visual blurring or hyper / hypo glycemic episodes.  Heme/Lymph: No excessive bleeding, bruising or enlarged lymph nodes.  Physical Exam  BP (!) 136/92   Pulse 68   Temp 97.9 F (36.6 C)   Resp 16   Ht 5' 10.5" (1.791 m)   Wt 225 lb 9.6 oz (102.3 kg)   BMI 31.91 kg/m   General Appearance: Well nourished and well groomed and in no apparent distress.  Eyes: PERRLA, EOMs, conjunctiva no swelling or erythema, normal fundi and vessels. Sinuses: No frontal/maxillary tenderness ENT/Mouth: EACs patent / TMs  nl. Nares clear without erythema, swelling, mucoid exudates. Oral hygiene is good. No erythema, swelling, or exudate. Tongue normal, non-obstructing. Tonsils not swollen or erythematous. Hearing normal.  Neck: Supple, thyroid not palpable. No bruits, nodes or JVD. Respiratory: Respiratory effort normal.  BS equal and clear bilateral without rales, rhonci, wheezing or stridor. Cardio: Heart sounds are normal  with regular rate and rhythm and no murmurs, rubs or gallops. Peripheral pulses are normal and equal bilaterally without edema. No aortic or femoral bruits. Chest: symmetric with normal excursions and percussion.  Abdomen: Soft, with Nl bowel sounds. Nontender, no guarding, rebound, hernias, masses, or organomegaly.  Lymphatics: Non tender without lymphadenopathy.  Genitourinary: No hernias.Testes nl. DRE - prostate nl for age - smooth & firm w/o nodules. Musculoskeletal: Full ROM all peripheral extremities, joint stability, 5/5 strength, and normal gait. Skin: Warm and dry without rashes, lesions, cyanosis, clubbing or  ecchymosis.  Neuro: Cranial nerves intact, reflexes equal bilaterally. Normal muscle tone, no cerebellar symptoms. Sensation intact.  Pysch: Alert and oriented X 3 with normal affect,  insight and judgment appropriate.   Assessment and Plan  1. Annual Preventative/Screening Exam   2. Labile hypertension  - EKG 12-Lead - Urinalysis, Routine w reflex microscopic - Microalbumin / creatinine urine ratio - CBC with Differential/Platelet - COMPLETE METABOLIC PANEL WITH GFR - Magnesium - TSH  3. Hyperlipidemia, mixed  - EKG 12-Lead - Lipid panel - TSH  4. Insulin resistance  - EKG 12-Lead - Hemoglobin A1c - Insulin, random  5. Vitamin D deficiency  - VITAMIN D 25 Hydroxyl  6. Testosterone deficiency  - Testosterone  7. Attention deficit disorder   8. Screening for colorectal cancer  - POC Hemoccult Bld/Stl  9. Screening for ischemic heart disease  - EKG 12-Lead  10. Family history of cerebrovascular event  - EKG 12-Lead  11. Screening examination for pulmonary tuberculosis  - TB Skin Test  12. Fatigue  - Iron,Total/Total Iron Binding Cap - Vitamin B12 - Testosterone - CBC with Differential/Platelet  13. Medication management  - Urinalysis, Routine w reflex microscopic - Microalbumin / creatinine urine ratio - CBC with Differential/Platelet - COMPLETE METABOLIC PANEL WITH GFR - Magnesium - Lipid panel - TSH - Hemoglobin A1c - Insulin, random - VITAMIN D 25 Hydroxyl  14. Need for prophylactic vaccination and inoculation against influenza  - FLU VACCINE MDCK QUAD W/Preservative           Patient was counseled in prudent diet, weight control to achieve/maintain BMI less than 25, BP monitoring, regular exercise and medications as discussed.  Discussed med effects and SE's. Routine screening labs and tests as requested with regular follow-up as recommended. Over 40 minutes of exam, counseling, chart review and high complex critical decision making was performed

## 2018-02-09 ENCOUNTER — Ambulatory Visit (INDEPENDENT_AMBULATORY_CARE_PROVIDER_SITE_OTHER): Payer: BLUE CROSS/BLUE SHIELD | Admitting: Internal Medicine

## 2018-02-09 ENCOUNTER — Encounter: Payer: Self-pay | Admitting: Internal Medicine

## 2018-02-09 VITALS — BP 136/92 | HR 68 | Temp 97.9°F | Resp 16 | Ht 70.5 in | Wt 225.6 lb

## 2018-02-09 DIAGNOSIS — N401 Enlarged prostate with lower urinary tract symptoms: Secondary | ICD-10-CM | POA: Diagnosis not present

## 2018-02-09 DIAGNOSIS — Z13220 Encounter for screening for lipoid disorders: Secondary | ICD-10-CM | POA: Diagnosis not present

## 2018-02-09 DIAGNOSIS — F988 Other specified behavioral and emotional disorders with onset usually occurring in childhood and adolescence: Secondary | ICD-10-CM

## 2018-02-09 DIAGNOSIS — Z125 Encounter for screening for malignant neoplasm of prostate: Secondary | ICD-10-CM | POA: Diagnosis not present

## 2018-02-09 DIAGNOSIS — Z1211 Encounter for screening for malignant neoplasm of colon: Secondary | ICD-10-CM

## 2018-02-09 DIAGNOSIS — Z23 Encounter for immunization: Secondary | ICD-10-CM | POA: Diagnosis not present

## 2018-02-09 DIAGNOSIS — Z0000 Encounter for general adult medical examination without abnormal findings: Secondary | ICD-10-CM | POA: Diagnosis not present

## 2018-02-09 DIAGNOSIS — Z111 Encounter for screening for respiratory tuberculosis: Secondary | ICD-10-CM

## 2018-02-09 DIAGNOSIS — Z1389 Encounter for screening for other disorder: Secondary | ICD-10-CM

## 2018-02-09 DIAGNOSIS — R5383 Other fatigue: Secondary | ICD-10-CM

## 2018-02-09 DIAGNOSIS — I10 Essential (primary) hypertension: Secondary | ICD-10-CM | POA: Diagnosis not present

## 2018-02-09 DIAGNOSIS — E8881 Metabolic syndrome: Secondary | ICD-10-CM

## 2018-02-09 DIAGNOSIS — E559 Vitamin D deficiency, unspecified: Secondary | ICD-10-CM | POA: Diagnosis not present

## 2018-02-09 DIAGNOSIS — Z130 Encounter for screening for diseases of the blood and blood-forming organs and certain disorders involving the immune mechanism: Secondary | ICD-10-CM | POA: Diagnosis not present

## 2018-02-09 DIAGNOSIS — Z136 Encounter for screening for cardiovascular disorders: Secondary | ICD-10-CM

## 2018-02-09 DIAGNOSIS — R0989 Other specified symptoms and signs involving the circulatory and respiratory systems: Secondary | ICD-10-CM

## 2018-02-09 DIAGNOSIS — Z1329 Encounter for screening for other suspected endocrine disorder: Secondary | ICD-10-CM

## 2018-02-09 DIAGNOSIS — R350 Frequency of micturition: Secondary | ICD-10-CM

## 2018-02-09 DIAGNOSIS — Z79899 Other long term (current) drug therapy: Secondary | ICD-10-CM

## 2018-02-09 DIAGNOSIS — Z131 Encounter for screening for diabetes mellitus: Secondary | ICD-10-CM

## 2018-02-09 DIAGNOSIS — E782 Mixed hyperlipidemia: Secondary | ICD-10-CM

## 2018-02-09 DIAGNOSIS — Z8249 Family history of ischemic heart disease and other diseases of the circulatory system: Secondary | ICD-10-CM

## 2018-02-09 DIAGNOSIS — E349 Endocrine disorder, unspecified: Secondary | ICD-10-CM

## 2018-02-09 DIAGNOSIS — Z0001 Encounter for general adult medical examination with abnormal findings: Secondary | ICD-10-CM

## 2018-02-09 DIAGNOSIS — Z1212 Encounter for screening for malignant neoplasm of rectum: Secondary | ICD-10-CM

## 2018-02-10 ENCOUNTER — Encounter: Payer: Self-pay | Admitting: Internal Medicine

## 2018-02-12 LAB — URINALYSIS, ROUTINE W REFLEX MICROSCOPIC
Bilirubin Urine: NEGATIVE
Glucose, UA: NEGATIVE
Hgb urine dipstick: NEGATIVE
KETONES UR: NEGATIVE
Leukocytes, UA: NEGATIVE
NITRITE: NEGATIVE
Protein, ur: NEGATIVE
SPECIFIC GRAVITY, URINE: 1.02 (ref 1.001–1.03)
pH: 8 (ref 5.0–8.0)

## 2018-02-12 LAB — CBC WITH DIFFERENTIAL/PLATELET
BASOS PCT: 0.8 %
Basophils Absolute: 47 cells/uL (ref 0–200)
Eosinophils Absolute: 360 cells/uL (ref 15–500)
Eosinophils Relative: 6.1 %
HCT: 40.9 % (ref 38.5–50.0)
Hemoglobin: 13.9 g/dL (ref 13.2–17.1)
Lymphs Abs: 2401 cells/uL (ref 850–3900)
MCH: 29.4 pg (ref 27.0–33.0)
MCHC: 34 g/dL (ref 32.0–36.0)
MCV: 86.5 fL (ref 80.0–100.0)
MPV: 10.5 fL (ref 7.5–12.5)
Monocytes Relative: 8.4 %
Neutro Abs: 2596 cells/uL (ref 1500–7800)
Neutrophils Relative %: 44 %
PLATELETS: 301 10*3/uL (ref 140–400)
RBC: 4.73 10*6/uL (ref 4.20–5.80)
RDW: 11.8 % (ref 11.0–15.0)
TOTAL LYMPHOCYTE: 40.7 %
WBC mixed population: 496 cells/uL (ref 200–950)
WBC: 5.9 10*3/uL (ref 3.8–10.8)

## 2018-02-12 LAB — COMPLETE METABOLIC PANEL WITH GFR
AG Ratio: 2.1 (calc) (ref 1.0–2.5)
ALKALINE PHOSPHATASE (APISO): 85 U/L (ref 40–115)
ALT: 49 U/L — AB (ref 9–46)
AST: 32 U/L (ref 10–40)
Albumin: 4.8 g/dL (ref 3.6–5.1)
BUN: 10 mg/dL (ref 7–25)
CO2: 28 mmol/L (ref 20–32)
CREATININE: 0.97 mg/dL (ref 0.60–1.35)
Calcium: 9.8 mg/dL (ref 8.6–10.3)
Chloride: 103 mmol/L (ref 98–110)
GFR, Est African American: 117 mL/min/{1.73_m2} (ref >=60)
GFR, Est Non African American: 101 mL/min/{1.73_m2} (ref >=60)
GLUCOSE: 109 mg/dL — AB (ref 65–99)
Globulin: 2.3 g/dL (calc) (ref 1.9–3.7)
Potassium: 4.1 mmol/L (ref 3.5–5.3)
Sodium: 142 mmol/L (ref 135–146)
Total Bilirubin: 0.4 mg/dL (ref 0.2–1.2)
Total Protein: 7.1 g/dL (ref 6.1–8.1)

## 2018-02-12 LAB — LIPID PANEL
Cholesterol: 145 mg/dL (ref <200)
HDL: 37 mg/dL — AB (ref >40)
LDL Cholesterol (Calc): 81 mg/dL (calc)
Non-HDL Cholesterol (Calc): 108 mg/dL (calc) (ref <130)
TRIGLYCERIDES: 172 mg/dL — AB (ref <150)
Total CHOL/HDL Ratio: 3.9 (calc) (ref <5.0)

## 2018-02-12 LAB — MAGNESIUM: MAGNESIUM: 2 mg/dL (ref 1.5–2.5)

## 2018-02-12 LAB — IRON, TOTAL/TOTAL IRON BINDING CAP
%SAT: 21 % (calc) (ref 20–48)
Iron: 71 ug/dL (ref 50–180)
TIBC: 333 ug/dL (ref 250–425)

## 2018-02-12 LAB — VITAMIN D 25 HYDROXY (VIT D DEFICIENCY, FRACTURES): VIT D 25 HYDROXY: 115 ng/mL — AB (ref 30–100)

## 2018-02-12 LAB — TSH: TSH: 1.6 mIU/L (ref 0.40–4.50)

## 2018-02-12 LAB — MICROALBUMIN / CREATININE URINE RATIO
Creatinine, Urine: 184 mg/dL (ref 20–320)
MICROALB UR: 0.9 mg/dL
MICROALB/CREAT RATIO: 5 ug/mg{creat} (ref <30)

## 2018-02-12 LAB — TESTOSTERONE: Testosterone: 408 ng/dL (ref 250–827)

## 2018-02-12 LAB — VITAMIN B12: Vitamin B-12: 1298 pg/mL — ABNORMAL HIGH (ref 200–1100)

## 2018-02-12 LAB — INSULIN, RANDOM: INSULIN: 26.8 u[IU]/mL — AB (ref 2.0–19.6)

## 2018-02-12 LAB — HEMOGLOBIN A1C
EAG (MMOL/L): 6.2 (calc)
HEMOGLOBIN A1C: 5.5 %{Hb} (ref <5.7)
Mean Plasma Glucose: 111 (calc)

## 2018-02-17 ENCOUNTER — Other Ambulatory Visit: Payer: Self-pay | Admitting: Adult Health

## 2018-02-17 DIAGNOSIS — F988 Other specified behavioral and emotional disorders with onset usually occurring in childhood and adolescence: Secondary | ICD-10-CM

## 2018-02-19 ENCOUNTER — Other Ambulatory Visit: Payer: Self-pay

## 2018-02-19 DIAGNOSIS — F988 Other specified behavioral and emotional disorders with onset usually occurring in childhood and adolescence: Secondary | ICD-10-CM

## 2018-02-19 MED ORDER — AMPHETAMINE-DEXTROAMPHETAMINE 20 MG PO TABS: ORAL_TABLET | ORAL | 0 refills | Status: DC

## 2018-02-20 ENCOUNTER — Other Ambulatory Visit: Payer: Self-pay | Admitting: Internal Medicine

## 2018-02-20 DIAGNOSIS — F988 Other specified behavioral and emotional disorders with onset usually occurring in childhood and adolescence: Secondary | ICD-10-CM

## 2018-02-20 MED ORDER — AMPHETAMINE-DEXTROAMPHETAMINE 20 MG PO TABS: ORAL_TABLET | ORAL | 0 refills | Status: DC

## 2018-02-27 ENCOUNTER — Other Ambulatory Visit: Payer: Self-pay | Admitting: Internal Medicine

## 2018-03-27 ENCOUNTER — Other Ambulatory Visit: Payer: Self-pay

## 2018-03-27 DIAGNOSIS — F988 Other specified behavioral and emotional disorders with onset usually occurring in childhood and adolescence: Secondary | ICD-10-CM

## 2018-03-27 MED ORDER — AMPHETAMINE-DEXTROAMPHETAMINE 20 MG PO TABS: ORAL_TABLET | ORAL | 0 refills | Status: DC

## 2018-04-26 ENCOUNTER — Other Ambulatory Visit: Payer: Self-pay

## 2018-04-26 DIAGNOSIS — F988 Other specified behavioral and emotional disorders with onset usually occurring in childhood and adolescence: Secondary | ICD-10-CM

## 2018-04-26 MED ORDER — AMPHETAMINE-DEXTROAMPHETAMINE 20 MG PO TABS: ORAL_TABLET | ORAL | 0 refills | Status: DC

## 2018-05-18 ENCOUNTER — Ambulatory Visit: Payer: Self-pay | Admitting: Adult Health

## 2018-05-24 ENCOUNTER — Other Ambulatory Visit: Payer: Self-pay | Admitting: Internal Medicine

## 2018-05-24 DIAGNOSIS — E782 Mixed hyperlipidemia: Secondary | ICD-10-CM

## 2018-05-28 ENCOUNTER — Other Ambulatory Visit: Payer: Self-pay

## 2018-05-28 DIAGNOSIS — F988 Other specified behavioral and emotional disorders with onset usually occurring in childhood and adolescence: Secondary | ICD-10-CM

## 2018-05-28 MED ORDER — AMPHETAMINE-DEXTROAMPHETAMINE 20 MG PO TABS: ORAL_TABLET | ORAL | 0 refills | Status: DC

## 2018-05-30 ENCOUNTER — Other Ambulatory Visit: Payer: Self-pay

## 2018-05-30 DIAGNOSIS — F988 Other specified behavioral and emotional disorders with onset usually occurring in childhood and adolescence: Secondary | ICD-10-CM

## 2018-05-30 MED ORDER — AMPHETAMINE-DEXTROAMPHETAMINE 30 MG PO TABS: ORAL_TABLET | ORAL | 0 refills | Status: DC

## 2018-06-15 ENCOUNTER — Ambulatory Visit (INDEPENDENT_AMBULATORY_CARE_PROVIDER_SITE_OTHER): Payer: BLUE CROSS/BLUE SHIELD | Admitting: Adult Health Nurse Practitioner

## 2018-06-15 ENCOUNTER — Encounter: Payer: Self-pay | Admitting: Adult Health Nurse Practitioner

## 2018-06-15 VITALS — BP 122/86 | HR 84 | Temp 98.1°F | Ht 70.5 in | Wt 229.0 lb

## 2018-06-15 DIAGNOSIS — F988 Other specified behavioral and emotional disorders with onset usually occurring in childhood and adolescence: Secondary | ICD-10-CM

## 2018-06-15 DIAGNOSIS — E782 Mixed hyperlipidemia: Secondary | ICD-10-CM

## 2018-06-15 DIAGNOSIS — F32A Depression, unspecified: Secondary | ICD-10-CM

## 2018-06-15 DIAGNOSIS — F329 Major depressive disorder, single episode, unspecified: Secondary | ICD-10-CM

## 2018-06-15 DIAGNOSIS — M79644 Pain in right finger(s): Secondary | ICD-10-CM

## 2018-06-15 DIAGNOSIS — E559 Vitamin D deficiency, unspecified: Secondary | ICD-10-CM

## 2018-06-15 DIAGNOSIS — R0989 Other specified symptoms and signs involving the circulatory and respiratory systems: Secondary | ICD-10-CM | POA: Diagnosis not present

## 2018-06-15 DIAGNOSIS — Z79899 Other long term (current) drug therapy: Secondary | ICD-10-CM

## 2018-06-15 MED ORDER — MELOXICAM 15 MG PO TABS: ORAL_TABLET | ORAL | 1 refills | Status: DC

## 2018-06-15 NOTE — Progress Notes (Signed)
3 Month Follow Up   Assessment and Plan:   Cole Lozano was seen today for follow-up.  Diagnoses and all orders for this visit:  Labile hypertension Controlled today Monitor blood pressure at home; call if consistently  Continue DASH diet.   Reminder to go to the ER if any CP, SOB, nausea, dizziness, severe HA, changes vision/speech, left arm numbness and tingling and jaw pain.  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  Doing this related to shortage of 20mg  tablets.  Would like to go back on 20mg  Adderall when available.  Hyperlipidemia, mixed Discussed dietary and exercise modifications Continue Lipitor Will check lipids at next visit  Depression, controlled Continue current regiment with benefit Wellbutrin and celexa  Finger pain, right -     meloxicam (MOBIC) 15 MG tablet; Take one daily with food for 2 weeks, can take with tylenol, can not take with aleve, iburpofen, then as needed daily for pain Discussed ice although it has been some time since initial event.  Discussed pinch injury involving tendor or tendon sheath.  Discussed monitoring symptoms for sensory changes, discoloration or increasing pain.  Consider hand specialist with worsening symptoms.  Vitamin D deficiency Continue supplementation    Continue diet and meds as discussed. Further disposition pending results of labs. Discussed med's effects and SE's.   Over 30 minutes of exam, counseling, chart review, and critical decision making was performed.   Future Appointments  Date Time Provider Department Center  08/24/2018 10:30 AM Lucky Cowboy, MD [facility] None  02/28/2019 10:00 AM Lucky Cowboy, MD [facility] None    ----------------------------------------------------------------------------------------------------------------------  HPI 38 y.o. male  presents for 3 month follow up on HTN, HLD, Hypothyroidism, history of pre-diabetes,  weight and vitamin D deficiency.   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  He reports that the pain is not as severe.  When he holds the phone he says it feels like it is stretching.  Not using the appendage improve the pain.    Doing well tolerating the ADDERALL 30MG .  They have been out of the 20mg  tablets.  He is able to sleep at night without complications.  Tolerating this well but if he is taking half tablet twice a day.  Sometimes just once a day.  Taking the whole tablet he reports it is too much.  Wellbutrin and celexa has been working well and he is managing stress at work.  Reports he works long hours and has a high demanding job.  Reports his wife has noticed a difference since he started taking this.     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 Wt Readings from Last 3 Encounters:  06/15/18 229 lb (103.9 kg)  02/09/18 225 lb 9.6 oz (102.3 kg)  08/25/17 221 lb (100.2 kg)     His blood pressure has been controlled at home, today their BP is BP: 122/86  He does not workout. He denies any cardiac symptoms, chest pains, palpitations, shortness of breath, dizziness or lower extremity edema.     He is on cholesterol medication Atorvastatin and denies myalgias. His cholesterol is not at goal. The cholesterol last visit was:   Lab Results  Component Value Date   CHOL 145 02/09/2018   HDL 37 (L) 02/09/2018   LDLCALC 81 02/09/2018  TRIG 172 (H) 02/09/2018   CHOLHDL 3.9 02/09/2018    He has not been working on diet and exercise for prediabetes, and denies foot ulcerations, hyperglycemia, hypoglycemia , nausea, paresthesia of the feet, polydipsia and polyuria. Last A1C in the office was:  Lab Results  Component Value Date   HGBA1C 5.5 02/09/2018   Patient is on Vitamin D supplement.   Lab Results  Component  Value Date   VD25OH 115 (H) 02/09/2018       Current Medications:  Current Outpatient Medications on File Prior to Visit  Medication Sig  . albuterol (PROVENTIL HFA;VENTOLIN HFA) 108 (90 Base) MCG/ACT inhaler Inhale 2 puffs into the lungs every 6 (six) hours as needed for wheezing or shortness of breath.  . amphetamine-dextroamphetamine (ADDERALL) 30 MG tablet 1/2 to 1 tablet 2 x daily for ADD, should not take daily to avoid tolerance/addiction, script should last 30 days  . aspirin 81 MG tablet Take 81 mg by mouth daily.  Marland Kitchen. atorvastatin (LIPITOR) 80 MG tablet TAKE 1/2 TO 1 TABLET BY MOUTH DAILY OR AS DIRECTED FOR CHOLESTEROL  . buPROPion (WELLBUTRIN XL) 300 MG 24 hr tablet TAKE 1 TABLET BY MOUTH EVERY MORNING FOR FOCUS AND CONCENTRATION  . Cholecalciferol (VITAMIN D PO) Take 10,000 Units by mouth daily.   . citalopram (CELEXA) 40 MG tablet TAKE 1 TABLET BY MOUTH EVERY DAY  . Glucosamine-Chondroitin (GLUCOSAMINE CHONDR COMPLEX PO) Take by mouth 2 (two) times daily.  . minoxidil (ROGAINE) 2 % external solution Apply topically 2 (two) times daily.  . montelukast (SINGULAIR) 10 MG tablet Take 1 tablet (10 mg total) by mouth daily.  . multivitamin (ONE-A-DAY MEN'S) TABS tablet Take 1 tablet by mouth daily.  Marland Kitchen. OVER THE COUNTER MEDICATION Allergra tab daily  . OVER THE COUNTER MEDICATION Takes OTC Zyrtec daily   No current facility-administered medications on file prior to visit.     Allergies:  No Known Allergies   Medical History:  Past Medical History:  Diagnosis Date  . ADD (attention deficit disorder)   . Allergy   . Anxiety   . Asthma   . Depression   . Hyperlipidemia   . Hypertension   . Insulin resistance    history of normal A1C but insulin 60, 46  . Vitamin D deficiency     Family history- Reviewed and unchanged   Social history- Reviewed and unchanged   Names of Other Physician/Practitioners you currently use: 1. [facility]  here for primary care 2. Eye Exam, Due 3. Dentist, 2019  Patient Care Team: Lucky CowboyMcKeown, William, MD as PCP - General (Internal Medicine)   Screening Tests: Immunization History  Administered Date(s) Administered  . Influenza Inj Mdck Quad With Preservative 02/09/2018  . PPD Test 06/13/2013, 06/23/2014, 07/24/2015, 11/28/2016, 02/09/2018  . Tdap 06/13/2013     Vaccinations: TD or Tdap: 2015  Influenza: 2019  Pneumococcal: N/A Prevnar13: N/A Shingles/Zostavax: N/A   Preventative Care: Last colonoscopy: N/A  Imaging: Chest X-ray: 08/2017 EKG: 01/2018    Review of Systems:  Review of Systems  Constitutional: Negative for chills, diaphoresis, fever, malaise/fatigue and weight loss.  HENT: Negative for congestion, ear discharge, ear pain, hearing loss, nosebleeds, sinus pain, sore throat and tinnitus.   Eyes: Negative for blurred vision, double vision, photophobia, pain, discharge and redness.  Respiratory: Negative for cough, hemoptysis, sputum production, shortness of breath, wheezing and stridor.   Cardiovascular: Negative for chest pain, palpitations, orthopnea, claudication, leg swelling and PND.  Gastrointestinal: Negative  for abdominal pain, blood in stool, constipation, diarrhea, heartburn, melena, nausea and vomiting.  Genitourinary: Negative for dysuria, flank pain, frequency, hematuria and urgency.  Musculoskeletal: Positive for joint pain. Negative for back pain, falls, myalgias and neck pain.       Right hand, first finger joint pain, increased with use.  Skin: Negative for itching and rash.  Neurological: Negative for dizziness, tingling, tremors, sensory change, speech change, focal weakness, seizures, loss of consciousness, weakness and headaches.  Endo/Heme/Allergies: Negative for environmental allergies and polydipsia. Does not bruise/bleed easily.  Psychiatric/Behavioral: Negative for depression, hallucinations, memory loss, substance abuse and suicidal ideas.  The patient is not nervous/anxious and does not have insomnia.       Physical Exam: BP 122/86   Pulse 84   Temp 98.1 F (36.7 C)   Ht 5' 10.5" (1.791 m)   Wt 229 lb (103.9 kg)   SpO2 96%   BMI 32.39 kg/m  Wt Readings from Last 3 Encounters:  06/15/18 229 lb (103.9 kg)  02/09/18 225 lb 9.6 oz (102.3 kg)  08/25/17 221 lb (100.2 kg)   General Appearance: Well nourished, in no apparent distress. Eyes: PERRLA, EOMs, conjunctiva no swelling or erythema Sinuses: No Frontal/maxillary tenderness ENT/Mouth: Ext aud canals clear, TMs without erythema, bulging. No erythema, swelling, or exudate on post pharynx.  Tonsils not swollen or erythematous. Hearing normal.  Neck: Supple, thyroid normal.  Respiratory: Respiratory effort normal, BS equal bilaterally without rales, rhonchi, wheezing or stridor.  Cardio: RRR with no MRGs. Brisk peripheral pulses without edema.  Abdomen: Soft, + BS.  Non tender, no guarding, rebound, hernias, masses. Lymphatics: Non tender without lymphadenopathy.  Musculoskeletal: Full ROM, 5/5 strength, normal gait.  Right hand first finger PIP joint mild erythema noted.  tender with deep palpation and extension for prolonged period.  0.5cm firm non moveable nodule. Skin: Warm, dry without rashes, lesions, ecchymosis.  Neuro: Cranial nerves intact. No cerebellar symptoms.  Psych: Awake and oriented X 3, normal affect, Insight and Judgment appropriate.    Elder Negus, NP [facility] 10:46 PM

## 2018-06-15 NOTE — Patient Instructions (Signed)
Take Meloxicam once a day for two weeks.    Also pick up Pepsid to take while you are taking this to protect your stomach.  We will check labs next appointment    Call or return with new or worsening symptoms as discussed in appointment.  May contact via office phone [phone] or [place].   Preventive Care for Adults  A healthy lifestyle and preventive care can promote health and wellness. Preventive health guidelines for men include the following key practices:  A routine yearly physical is a good way to check with your health care provider about your health and preventative screening. It is a chance to share any concerns and updates on your health and to receive a thorough exam.  Visit your dentist for a routine exam and preventative care every 6 months. Brush your teeth twice a day and floss once a day. Good oral hygiene prevents tooth decay and gum disease.  The frequency of eye exams is based on your age, health, family medical history, use of contact lenses, and other factors. Follow your health care provider's recommendations for frequency of eye exams.  Eat a healthy diet. Foods such as vegetables, fruits, whole grains, low-fat dairy products, and lean protein foods contain the nutrients you need without too many calories. Decrease your intake of foods high in solid fats, added sugars, and salt. Eat the right amount of calories for you.Get information about a proper diet from your health care provider, if necessary.  Regular physical exercise is one of the most important things you can do for your health. Most adults should get at least 150 minutes of moderate-intensity exercise (any activity that increases your heart rate and causes you to sweat) each week. In addition, most adults need muscle-strengthening exercises on 2 or more days a week.  Maintain a healthy weight. The body mass index (BMI) is a screening tool to identify possible weight problems. It provides an  estimate of body fat based on height and weight. Your health care provider can find your BMI and can help you achieve or maintain a healthy weight.For adults 20 years and older:  A BMI below 18.5 is considered underweight.  A BMI of 18.5 to 24.9 is normal.  A BMI of 25 to 29.9 is considered overweight.  A BMI of 30 and above is considered obese.  Maintain normal blood lipids and cholesterol levels by exercising and minimizing your intake of saturated fat. Eat a balanced diet with plenty of fruit and vegetables. Blood tests for lipids and cholesterol should begin at age 59 and be repeated every 5 years. If your lipid or cholesterol levels are high, you are over 50, or you are at high risk for heart disease, you may need your cholesterol levels checked more frequently.Ongoing high lipid and cholesterol levels should be treated with medicines if diet and exercise are not working.  If you smoke, find out from your health care provider how to quit. If you do not use tobacco, do not start.  Lung cancer screening is recommended for adults aged 62-80 years who are at high risk for developing lung cancer because of a history of smoking. A yearly low-dose CT scan of the lungs is recommended for people who have at least a 30-pack-year history of smoking and are a current smoker or have quit within the past 15 years. A pack year of smoking is smoking an average of 1 pack of cigarettes a day for 1 year (for example: 1 pack a  day for 30 years or 2 packs a day for 15 years). Yearly screening should continue until the smoker has stopped smoking for at least 15 years. Yearly screening should be stopped for people who develop a health problem that would prevent them from having lung cancer treatment.  If you choose to drink alcohol, do not have more than 2 drinks per day. One drink is considered to be 12 ounces (355 mL) of beer, 5 ounces (148 mL) of wine, or 1.5 ounces (44 mL) of liquor.  High blood pressure  causes heart disease and increases the risk of stroke. Your blood pressure should be checked. Ongoing high blood pressure should be treated with medicines, if weight loss and exercise are not effective.  If you are 59-7 years old, ask your health care provider if you should take aspirin to prevent heart disease.  Diabetes screening involves taking a blood sample to check your fasting blood sugar level. Testing should be considered at a younger age or be carried out more frequently if you are overweight and have at least 1 risk factor for diabetes.  Colorectal cancer can be detected and often prevented. Most routine colorectal cancer screening begins at the age of 80 and continues through age 7. However, your health care provider may recommend screening at an earlier age if you have risk factors for colon cancer. On a yearly basis, your health care provider may provide home test kits to check for hidden blood in the stool. Use of a small camera at the end of a tube to directly examine the colon (sigmoidoscopy or colonoscopy) can detect the earliest forms of colorectal cancer. Talk to your health care provider about this at age 16, when routine screening begins. Direct exam of the colon should be repeated every 5-10 years through age 55, unless early forms of precancerous polyps or small growths are found.  Screening for abdominal aortic aneurysm (AAA)  are recommended for persons over age 32 who have history of hypertensionor who are current or former smokers.  Talk with your health care provider about prostate cancer screening.  Testicular cancer screening is recommended for adult males. Screening includes self-exam, a health care provider exam, and other screening tests. Consult with your health care provider about any symptoms you have or any concerns you have about testicular cancer.  Use sunscreen. Apply sunscreen liberally and repeatedly throughout the day. You should seek shade when your shadow  is shorter than you. Protect yourself by wearing long sleeves, pants, a wide-brimmed hat, and sunglasses year round, whenever you are outdoors.  Once a month, do a whole-body skin exam, using a mirror to look at the skin on your back. Tell your health care provider about new moles, moles that have irregular borders, moles that are larger than a pencil eraser, or moles that have changed in shape or color.  Stay current with required vaccines (immunizations).  Influenza vaccine. All adults should be immunized every year.  Tetanus, diphtheria, and acellular pertussis (Td, Tdap) vaccine. An adult who has not previously received Tdap or who does not know his vaccine status should receive 1 dose of Tdap. This initial dose should be followed by tetanus and diphtheria toxoids (Td) booster doses every 10 years. Adults with an unknown or incomplete history of completing a 3-dose immunization series with Td-containing vaccines should begin or complete a primary immunization series including a Tdap dose. Adults should receive a Td booster every 10 years.  Zoster vaccine. One dose is recommended for  adults aged 76 years or older unless certain conditions are present.    Pneumococcal 13-valent conjugate (PCV13) vaccine. When indicated, a person who is uncertain of his immunization history and has no record of immunization should receive the PCV13 vaccine. An adult aged 60 years or older who has certain medical conditions and has not been previously immunized should receive 1 dose of PCV13 vaccine. This PCV13 should be followed with a dose of pneumococcal polysaccharide (PPSV23) vaccine. The PPSV23 vaccine dose should be obtained at least 8 weeks after the dose of PCV13 vaccine. An adult aged 84 years or older who has certain medical conditions and previously received 1 or more doses of PPSV23 vaccine should receive 1 dose of PCV13. The PCV13 vaccine dose should be obtained 1 or more years after the last PPSV23  vaccine dose.    Pneumococcal polysaccharide (PPSV23) vaccine. When PCV13 is also indicated, PCV13 should be obtained first. All adults aged 31 years and older should be immunized. An adult younger than age 31 years who has certain medical conditions should be immunized. Any person who resides in a nursing home or long-term care facility should be immunized. An adult smoker should be immunized. People with an immunocompromised condition and certain other conditions should receive both PCV13 and PPSV23 vaccines. People with human immunodeficiency virus (HIV) infection should be immunized as soon as possible after diagnosis. Immunization during chemotherapy or radiation therapy should be avoided. Routine use of PPSV23 vaccine is not recommended for American Indians, [place] Natives, or people younger than 65 years unless there are medical conditions that require PPSV23 vaccine. When indicated, people who have unknown immunization and have no record of immunization should receive PPSV23 vaccine. One-time revaccination 5 years after the first dose of PPSV23 is recommended for people aged 19-64 years who have chronic kidney failure, nephrotic syndrome, asplenia, or immunocompromised conditions. People who received 1-2 doses of PPSV23 before age 25 years should receive another dose of PPSV23 vaccine at age 67 years or later if at least 5 years have passed since the previous dose. Doses of PPSV23 are not needed for people immunized with PPSV23 at or after age 54 years.  Hepatitis A vaccine. Adults who wish to be protected from this disease, have certain high-risk conditions, work with hepatitis A-infected animals, work in hepatitis A research labs, or travel to or work in countries with a high rate of hepatitis A should be immunized. Adults who were previously unvaccinated and who anticipate close contact with an international adoptee during the first 60 days after arrival in the [place] from a country with a  high rate of hepatitis A should be immunized.  Hepatitis B vaccine. Adults should be immunized if they wish to be protected from this disease, have certain high-risk conditions, may be exposed to blood or other infectious body fluids, are household contacts or sex partners of hepatitis B positive people, are clients or workers in certain care facilities, or travel to or work in countries with a high rate of hepatitis B.  Preventive Service / Frequency  Ages 93 to 89  Blood pressure check.  Lipid and cholesterol check.  Hepatitis C blood test.** / For any individual with known risks for hepatitis C.  Skin self-exam. / Monthly.  Influenza vaccine. / Every year.  Tetanus, diphtheria, and acellular pertussis (Tdap, Td) vaccine.** / Consult your health care provider. 1 dose of Td every 10 years.  HPV vaccine. / 3 doses over 6 months, if 73 or younger.  Measles, mumps, rubella (MMR) vaccine.** / You need at least 1 dose of MMR if you were born in 1957 or later. You may also need a second dose.  Pneumococcal 13-valent conjugate (PCV13) vaccine.** / Consult your health care provider.  Pneumococcal polysaccharide (PPSV23) vaccine.** / 1 to 2 doses if you smoke cigarettes or if you have certain conditions.  Meningococcal vaccine.** / 1 dose if you are age 49 to 58 years and a Market researcher living in a residence hall, or have one of several medical conditions. You may also need additional booster doses.  Hepatitis A vaccine.** / Consult your health care provider.  Hepatitis B vaccine.** / Consult your health care provider. +++++++++ Recommend Adult Low Dose Aspirin or  coated  Aspirin 81 mg daily  To reduce risk of Colon Cancer 20 %,  Skin Cancer 26 % ,  Melanoma 46%  and  Pancreatic cancer 60% ++++++++++++++++++ Vitamin D goal  is between 70-100.  Please make sure that you are taking your Vitamin D as directed.  It is very important as a natural anti-inflammatory   helping hair, skin, and nails, as well as reducing stroke and heart attack risk.  It helps your bones and helps with mood. It also decreases numerous cancer risks so please take it as directed.  Low Vit D is associated with a 200-300% higher risk for CANCER  and 200-300% higher risk for HEART   ATTACK  &  STROKE.   .....................................Marland Kitchen It is also associated with higher death rate at younger ages,  autoimmune diseases like Rheumatoid arthritis, Lupus, Multiple Sclerosis.    Also many other serious conditions, like depression, Alzheimer's Dementia, infertility, muscle aches, fatigue, fibromyalgia - just to name a few. +++++++++++++++++++++ Recommend the book "The END of DIETING" by Dr Excell Seltzer  & the book "The END of DIABETES " by Dr Excell Seltzer At [facility] [email or web address] - get book & Audio CD's    Being diabetic has a  300% increased risk for heart attack, stroke, cancer, and alzheimer- type vascular dementia. It is very important that you work harder with diet by avoiding all foods that are white. Avoid white rice (brown & wild rice is OK), white potatoes (sweetpotatoes in moderation is OK), White bread or wheat bread or anything made out of white flour like bagels, donuts, rolls, buns, biscuits, cakes, pastries, cookies, pizza crust, and pasta (made from white flour & egg whites) - vegetarian pasta or spinach or wheat pasta is OK. Multigrain breads like Arnold's or [employer], or multigrain sandwich thins or flatbreads.  Diet, exercise and weight loss can reverse and cure diabetes in the early stages.  Diet, exercise and weight loss is very important in the control and prevention of complications of diabetes which affects every system in your body, ie. Brain - dementia/stroke, eyes - glaucoma/blindness, heart - heart attack/heart failure, kidneys - dialysis, stomach - gastric paralysis, intestines - malabsorption, nerves - severe painful neuritis, circulation - gangrene & loss of a  leg(s), and finally cancer and Alzheimers.    I recommend avoid fried & greasy foods,  sweets/candy, white rice (brown or wild rice or Quinoa is OK), white potatoes (sweet potatoes are OK) - anything made from white flour - bagels, doughnuts, rolls, buns, biscuits,white and wheat breads, pizza crust and traditional pasta made of white flour & egg white(vegetarian pasta or spinach or wheat pasta is OK).  Multi-grain bread is OK - like multi-grain flat bread or sandwich thins. Avoid alcohol in excess.  Exercise is also important.    Eat all the vegetables you want - avoid meat, especially red meat and dairy - especially cheese.  Cheese is the most concentrated form of trans-fats which is the worst thing to clog up our arteries. Veggie cheese is OK which can be found in the fresh produce section at [employer] or [employer] or [employer]  +++++++++++++++++++ DASH Eating Plan  DASH stands for "Dietary Approaches to Stop Hypertension."   The DASH eating plan is a healthy eating plan that has been shown to reduce high blood pressure (hypertension). Additional health benefits may include reducing the risk of type 2 diabetes mellitus, heart disease, and stroke. The DASH eating plan may also help with weight loss. WHAT DO I NEED TO KNOW ABOUT THE DASH EATING PLAN? For the DASH eating plan, you will follow these general guidelines:  Choose foods with a percent daily value for sodium of less than 5% (as listed on the food label).  Use salt-free seasonings or herbs instead of table salt or sea salt.  Check with your health care provider or pharmacist before using salt substitutes.  Eat lower-sodium products, often labeled as "lower sodium" or "no salt added."  Eat fresh foods.  Eat more vegetables, fruits, and low-fat dairy products.  Choose whole grains. Look for the word "whole" as the first word in the ingredient list.  Choose fish   Limit sweets, desserts, sugars, and sugary drinks.  Choose  heart-healthy fats.  Eat veggie cheese   Eat more home-cooked food and less restaurant, buffet, and fast food.  Limit fried foods.  Cook foods using methods other than frying.  Limit canned vegetables. If you do use them, rinse them well to decrease the sodium.  When eating at a restaurant, ask that your food be prepared with less salt, or no salt if possible.                      WHAT FOODS CAN I EAT? Read Dr Fara Olden Fuhrman's books on The End of Dieting & The End of Diabetes  Grains Whole grain or whole wheat bread. Brown rice. Whole grain or whole wheat pasta. Quinoa, bulgur, and whole grain cereals. Low-sodium cereals. Corn or whole wheat flour tortillas. Whole grain cornbread. Whole grain crackers. Low-sodium crackers.  Vegetables Fresh or frozen vegetables (raw, steamed, roasted, or grilled). Low-sodium or reduced-sodium tomato and vegetable juices. Low-sodium or reduced-sodium tomato sauce and paste. Low-sodium or reduced-sodium canned vegetables.   Fruits All fresh, canned (in natural juice), or frozen fruits.  Protein Products  All fish and seafood.  Dried beans, peas, or lentils. Unsalted nuts and seeds. Unsalted canned beans.  Dairy Low-fat dairy products, such as skim or 1% milk, 2% or reduced-fat cheeses, low-fat ricotta or cottage cheese, or plain low-fat yogurt. Low-sodium or reduced-sodium cheeses.  Fats and Oils Tub margarines without trans fats. Light or reduced-fat mayonnaise and salad dressings (reduced sodium). Avocado. Safflower, olive, or canola oils. Natural peanut or almond butter.  Other Unsalted popcorn and pretzels. The items listed above may not be a complete list of recommended foods or beverages. Contact your dietitian for more options.  +++++++++++++++++++  WHAT FOODS ARE NOT RECOMMENDED? Grains/ White flour or wheat flour White bread. White pasta. White rice. Refined cornbread. Bagels and croissants. Crackers that contain trans  fat.  Vegetables  Creamed or fried vegetables. Vegetables in a . Regular canned vegetables. Regular canned tomato sauce and paste. Regular tomato and vegetable juices.  Fruits Dried fruits. Canned fruit in light or heavy syrup. Fruit juice.  Meat and Other Protein Products Meat in general - RED meat & White meat.  Fatty cuts of meat. Ribs, chicken wings, all processed meats as bacon, sausage, bologna, salami, fatback, hot dogs, bratwurst and packaged luncheon meats.  Dairy Whole or 2% milk, cream, half-and-half, and cream cheese. Whole-fat or sweetened yogurt. Full-fat cheeses or blue cheese. Non-dairy creamers and whipped toppings. Processed cheese, cheese spreads, or cheese curds.  Condiments Onion and garlic salt, seasoned salt, table salt, and sea salt. Canned and packaged gravies. Worcestershire sauce. Tartar sauce. Barbecue sauce. Teriyaki sauce. Soy sauce, including reduced sodium. Steak sauce. Fish sauce. Oyster sauce. Cocktail sauce. Horseradish. Ketchup and mustard. Meat flavorings and tenderizers. Bouillon cubes. Hot sauce. Tabasco sauce. Marinades. Taco seasonings. Relishes.  Fats and Oils Butter, stick margarine, lard, shortening and bacon fat. Coconut, palm kernel, or palm oils. Regular salad dressings.  Pickles and olives. Salted popcorn and pretzels.  The items listed above may not be a complete list of foods and beverages to avoid.

## 2018-06-20 ENCOUNTER — Encounter: Payer: Self-pay | Admitting: Adult Health Nurse Practitioner

## 2018-06-29 ENCOUNTER — Other Ambulatory Visit: Payer: Self-pay

## 2018-06-29 DIAGNOSIS — D2262 Melanocytic nevi of left upper limb, including shoulder: Secondary | ICD-10-CM | POA: Diagnosis not present

## 2018-06-29 DIAGNOSIS — D225 Melanocytic nevi of trunk: Secondary | ICD-10-CM | POA: Diagnosis not present

## 2018-06-29 DIAGNOSIS — L570 Actinic keratosis: Secondary | ICD-10-CM | POA: Diagnosis not present

## 2018-06-29 DIAGNOSIS — F988 Other specified behavioral and emotional disorders with onset usually occurring in childhood and adolescence: Secondary | ICD-10-CM

## 2018-06-29 DIAGNOSIS — D2261 Melanocytic nevi of right upper limb, including shoulder: Secondary | ICD-10-CM | POA: Diagnosis not present

## 2018-06-30 ENCOUNTER — Other Ambulatory Visit: Payer: Self-pay | Admitting: Internal Medicine

## 2018-06-30 DIAGNOSIS — F988 Other specified behavioral and emotional disorders with onset usually occurring in childhood and adolescence: Secondary | ICD-10-CM

## 2018-06-30 MED ORDER — AMPHETAMINE-DEXTROAMPHETAMINE 20 MG PO TABS: ORAL_TABLET | ORAL | 0 refills | Status: DC

## 2018-07-30 ENCOUNTER — Other Ambulatory Visit: Payer: Self-pay | Admitting: Internal Medicine

## 2018-08-01 ENCOUNTER — Other Ambulatory Visit: Payer: Self-pay

## 2018-08-01 DIAGNOSIS — F988 Other specified behavioral and emotional disorders with onset usually occurring in childhood and adolescence: Secondary | ICD-10-CM

## 2018-08-01 MED ORDER — AMPHETAMINE-DEXTROAMPHETAMINE 20 MG PO TABS: ORAL_TABLET | ORAL | 0 refills | Status: DC

## 2018-08-24 ENCOUNTER — Ambulatory Visit: Payer: Self-pay | Admitting: Internal Medicine

## 2018-08-28 ENCOUNTER — Other Ambulatory Visit: Payer: Self-pay

## 2018-08-28 DIAGNOSIS — F988 Other specified behavioral and emotional disorders with onset usually occurring in childhood and adolescence: Secondary | ICD-10-CM

## 2018-08-28 MED ORDER — AMPHETAMINE-DEXTROAMPHETAMINE 20 MG PO TABS: ORAL_TABLET | ORAL | 0 refills | Status: DC

## 2018-09-21 ENCOUNTER — Other Ambulatory Visit: Payer: Self-pay

## 2018-09-21 DIAGNOSIS — F988 Other specified behavioral and emotional disorders with onset usually occurring in childhood and adolescence: Secondary | ICD-10-CM

## 2018-09-21 MED ORDER — AMPHETAMINE-DEXTROAMPHETAMINE 20 MG PO TABS: ORAL_TABLET | ORAL | 0 refills | Status: DC

## 2018-09-24 ENCOUNTER — Telehealth: Payer: Self-pay | Admitting: *Deleted

## 2018-09-24 NOTE — Telephone Encounter (Signed)
Patient called and asked Dr Oneta Rack to approve his to fill his Adderall refill today. 09/24/2018, instead of waiting until 09/26/2018.  The patient states he is working extra hours, due to the Covid 19 pandemic. [facility] was advised to fill the RX today, per Dr Oneta Rack.  The patieny will discuss medications with Dr Oneta Rack, at is 10/10/2018 visit.

## 2018-10-09 ENCOUNTER — Encounter: Payer: Self-pay | Admitting: Internal Medicine

## 2018-10-09 NOTE — Patient Instructions (Signed)

## 2018-10-09 NOTE — Progress Notes (Signed)
History of Present Illness:      This very nice 38 y.o. MWM presents for 6 month follow up with labile HTN, HLD, Pre-Diabetes and Vitamin D Deficiency. Patient is on Citalopram for anxiety and also is on Wellbutrin & Adderall for ADD improving his focus & concentyration.      Patient is followed expectantly for labile HTN circa 2016 & BP has been controlled at home. Today's BP is borderline high Normal - 138/90. Patient has had no complaints of any cardiac type chest pain, palpitations, dyspnea / orthopnea / PND, dizziness, claudication, or dependent edema.      Hyperlipidemia is controlled with diet & Lipitor. Patient denies myalgias or other med SE's. Last Lipids were at goal with a slightly elevated Trig's: Lab Results  Component Value Date   CHOL 145 02/09/2018   HDL 37 (L) 02/09/2018   LDLCALC 81 02/09/2018   TRIG 172 (H) 02/09/2018   CHOLHDL 3.9 02/09/2018       Also, the patient is overweight (BMI 31.9+) and has history of PreDiabetes / Insulin Resistance (A1c 5.2% / Insulin 94 - 2014 and A1c 5.6% / Insulin 46 - 2017)  and has had no symptoms of reactive hypoglycemia, diabetic polys, paresthesias or visual blurring.  Last A1c was Normal & at goal: Lab Results  Component Value Date   HGBA1C 5.5 02/09/2018       Further, the patient also has history of Vitamin D Deficiency and supplements vitamin D without any suspected side-effects. Last vitamin D was elevated on 14 caps of 5,000 units /week  & dose was tapered to 10 caps /week: Lab Results  Component Value Date   VD25OH 115 (H) 02/09/2018   Current Outpatient Medications on File Prior to Visit  Medication Sig  . albuterol (PROVENTIL HFA;VENTOLIN HFA) 108 (90 Base) MCG/ACT inhaler Inhale 2 puffs into the lungs every 6 (six) hours as needed for wheezing or shortness of breath.  . amphetamine-dextroamphetamine (ADDERALL) 20 MG tablet 1/2 to 1 tablet 1 or 2 x /daily for ADD, should not take daily to avoid tolerance/addiction,  script should last 30 days (til June 30)  . aspirin 81 MG tablet Take 81 mg by mouth daily.  Marland Kitchen. atorvastatin (LIPITOR) 80 MG tablet TAKE 1/2 TO 1 TABLET BY MOUTH DAILY OR AS DIRECTED FOR CHOLESTEROL  . buPROPion (WELLBUTRIN XL) 300 MG 24 hr tablet TAKE 1 TABLET BY MOUTH EVERY MORNING FOR FOCUS AND CONCENTRATION  . Cholecalciferol (VITAMIN D PO) Take 10,000 Units by mouth daily.   . citalopram (CELEXA) 40 MG tablet TAKE 1 TABLET BY MOUTH EVERY DAY  . minoxidil (ROGAINE) 2 % external solution Apply topically 2 (two) times daily.  . montelukast (SINGULAIR) 10 MG tablet TAKE 1 TABLET(10 MG) BY MOUTH DAILY  . multivitamin (ONE-A-DAY MEN'S) TABS tablet Take 1 tablet by mouth daily.  Marland Kitchen. OVER THE COUNTER MEDICATION Allergra tab daily  . OVER THE COUNTER MEDICATION Takes OTC Zyrtec daily   No current facility-administered medications on file prior to visit.    No Known Allergies  PMHx:   Past Medical History:  Diagnosis Date  . ADD (attention deficit disorder)   . Allergy   . Anxiety   . Asthma   . Depression   . Hyperlipidemia   . Hypertension   . Insulin resistance    history of normal A1C but insulin 60, 45  . Vitamin D deficiency    Immunization History  Administered Date(s) Administered  . Influenza Inj Mdck Quad  With Preservative 02/09/2018  . PPD Test 06/13/2013, 06/23/2014, 07/24/2015, 11/28/2016, 02/09/2018  . Tdap 06/13/2013   Past Surgical History:  Procedure Laterality Date  . ELBOW SURGERY Left 1991   s/p trauma  . HERNIA REPAIR Right 2003   inguinal  . TONSILLECTOMY AND ADENOIDECTOMY     FHx:    Reviewed / unchanged  SHx:    Reviewed / unchanged   Systems Review:  Constitutional: Denies fever, chills, wt changes, headaches, insomnia, fatigue, night sweats, change in appetite. Eyes: Denies redness, blurred vision, diplopia, discharge, itchy, watery eyes.  ENT: Denies discharge, congestion, post nasal drip, epistaxis, sore throat, earache, hearing loss, dental  pain, tinnitus, vertigo, sinus pain, snoring.  CV: Denies chest pain, palpitations, irregular heartbeat, syncope, dyspnea, diaphoresis, orthopnea, PND, claudication or edema. Respiratory: denies cough, dyspnea, DOE, pleurisy, hoarseness, laryngitis, wheezing.  Gastrointestinal: Denies dysphagia, odynophagia, heartburn, reflux, water brash, abdominal pain or cramps, nausea, vomiting, bloating, diarrhea, constipation, hematemesis, melena, hematochezia  or hemorrhoids. Genitourinary: Denies dysuria, frequency, urgency, nocturia, hesitancy, discharge, hematuria or flank pain. Musculoskeletal: Denies arthralgias, myalgias, stiffness, jt. swelling, pain, limping or strain/sprain.  Skin: Denies pruritus, rash, hives, warts, acne, eczema or change in skin lesion(s). Neuro: No weakness, tremor, incoordination, spasms, paresthesia or pain. Psychiatric: Denies confusion, memory loss or sensory loss. Endo: Denies change in weight, skin or hair change.  Heme/Lymph: No excessive bleeding, bruising or enlarged lymph nodes.  Physical Exam  BP 138/90   Pulse 80   Temp (!) 97.3 F (36.3 C)   Resp 16   Ht 5' 10.5" (1.791 m)   Wt 225 lb 12.8 oz (102.4 kg)   BMI 31.94 kg/m   Appears  well nourished, well groomed  and in no distress.  Eyes: PERRLA, EOMs, conjunctiva no swelling or erythema. Sinuses: No frontal/maxillary tenderness ENT/Mouth: EAC's clear, TM's nl w/o erythema, bulging. Nares clear w/o erythema, swelling, exudates. Oropharynx clear without erythema or exudates. Oral hygiene is good. Tongue normal, non obstructing. Hearing intact.  Neck: Supple. Thyroid not palpable. Car 2+/2+ without bruits, nodes or JVD. Chest: Respirations nl with BS clear & equal w/o rales, rhonchi, wheezing or stridor.  Cor: Heart sounds normal w/ regular rate and rhythm without sig. murmurs, gallops, clicks or rubs. Peripheral pulses normal and equal  without edema.  Abdomen: Soft & bowel sounds normal. Non-tender w/o  guarding, rebound, hernias, masses or organomegaly.  Lymphatics: Unremarkable.  Musculoskeletal: Full ROM all peripheral extremities, joint stability, 5/5 strength and normal gait.  Skin: Warm, dry without exposed rashes, lesions or ecchymosis apparent.  Neuro: Cranial nerves intact, reflexes equal bilaterally. Sensory-motor testing grossly intact. Tendon reflexes grossly intact.  Pysch: Alert & oriented x 3.  Insight and judgement nl & appropriate. No ideations.  Assessment and Plan:  1. Labile hypertension  - Continue medication, monitor blood pressure at home.  - Continue DASH diet.  Reminder to go to the ER if any CP,  SOB, nausea, dizziness, severe HA, changes vision/speech.  - CBC with Differential/Platelet - COMPLETE METABOLIC PANEL WITH GFR - Magnesium - TSH  2. Hyperlipidemia, mixed  - Continue diet/meds, exercise,& lifestyle modifications.  - Continue monitor periodic cholesterol/liver & renal functions   - Lipid panel - TSH  3. Abnormal glucose  - Continue diet, exercise  - Lifestyle modifications.  - Monitor appropriate labs.  - Hemoglobin A1c - Insulin, random  4. Vitamin D deficiency  - Continue supplementation.  - VITAMIN D 25 Hydroxyl  5. Exposure to Covid-19 Virus  - SAR CoV2  Serology (COVID 19)AB(IGG)IA  6. Medication management  - CBC with Differential/Platelet - COMPLETE METABOLIC PANEL WITH GFR - Magnesium - Lipid panel - TSH - SAR CoV2 Serology (COVID 19)AB(IGG)IA - VITAMIN D 25 Hydroxyl - Hemoglobin A1c - Insulin, random      Discussed  regular exercise, BP monitoring, weight control to achieve/maintain BMI less than 25 and discussed med and SE's. Recommended labs to assess and monitor clinical status with further disposition pending results of labs.  I discussed the assessment and treatment plan with the patient. The patient was provided an opportunity to ask questions and all were answered. I provided over 30 minutes of exam,  counseling, chart review and  complex critical decision making.   Cole MawWilliam D Khoen Genet, MD

## 2018-10-10 ENCOUNTER — Other Ambulatory Visit: Payer: Self-pay

## 2018-10-10 ENCOUNTER — Ambulatory Visit (INDEPENDENT_AMBULATORY_CARE_PROVIDER_SITE_OTHER): Payer: BC Managed Care – PPO | Admitting: Internal Medicine

## 2018-10-10 VITALS — BP 138/90 | HR 80 | Temp 97.3°F | Resp 16 | Ht 70.5 in | Wt 225.8 lb

## 2018-10-10 DIAGNOSIS — E559 Vitamin D deficiency, unspecified: Secondary | ICD-10-CM | POA: Diagnosis not present

## 2018-10-10 DIAGNOSIS — Z79899 Other long term (current) drug therapy: Secondary | ICD-10-CM | POA: Diagnosis not present

## 2018-10-10 DIAGNOSIS — R7309 Other abnormal glucose: Secondary | ICD-10-CM

## 2018-10-10 DIAGNOSIS — Z20822 Contact with and (suspected) exposure to covid-19: Secondary | ICD-10-CM

## 2018-10-10 DIAGNOSIS — Z20828 Contact with and (suspected) exposure to other viral communicable diseases: Secondary | ICD-10-CM | POA: Diagnosis not present

## 2018-10-10 DIAGNOSIS — R0989 Other specified symptoms and signs involving the circulatory and respiratory systems: Secondary | ICD-10-CM | POA: Diagnosis not present

## 2018-10-10 DIAGNOSIS — E782 Mixed hyperlipidemia: Secondary | ICD-10-CM

## 2018-10-11 LAB — LIPID PANEL
Cholesterol: 180 mg/dL
HDL: 40 mg/dL
LDL Cholesterol (Calc): 108 mg/dL — ABNORMAL HIGH
Non-HDL Cholesterol (Calc): 140 mg/dL — ABNORMAL HIGH
Total CHOL/HDL Ratio: 4.5 (calc)
Triglycerides: 201 mg/dL — ABNORMAL HIGH

## 2018-10-11 LAB — CBC WITH DIFFERENTIAL/PLATELET
Absolute Monocytes: 440 cells/uL (ref 200–950)
Basophils Absolute: 50 cells/uL (ref 0–200)
Basophils Relative: 0.8 %
Eosinophils Absolute: 329 cells/uL (ref 15–500)
Eosinophils Relative: 5.3 %
HCT: 42.3 % (ref 38.5–50.0)
Hemoglobin: 14.4 g/dL (ref 13.2–17.1)
Lymphs Abs: 2647 cells/uL (ref 850–3900)
MCH: 29.5 pg (ref 27.0–33.0)
MCHC: 34 g/dL (ref 32.0–36.0)
MCV: 86.7 fL (ref 80.0–100.0)
MPV: 10.7 fL (ref 7.5–12.5)
Monocytes Relative: 7.1 %
Neutro Abs: 2734 cells/uL (ref 1500–7800)
Neutrophils Relative %: 44.1 %
Platelets: 342 10*3/uL (ref 140–400)
RBC: 4.88 10*6/uL (ref 4.20–5.80)
RDW: 12.1 % (ref 11.0–15.0)
Total Lymphocyte: 42.7 %
WBC: 6.2 10*3/uL (ref 3.8–10.8)

## 2018-10-11 LAB — COMPLETE METABOLIC PANEL WITH GFR
AG Ratio: 1.8 (calc) (ref 1.0–2.5)
ALT: 49 U/L — ABNORMAL HIGH (ref 9–46)
AST: 33 U/L (ref 10–40)
Albumin: 5 g/dL (ref 3.6–5.1)
Alkaline phosphatase (APISO): 82 U/L (ref 36–130)
BUN: 10 mg/dL (ref 7–25)
CO2: 28 mmol/L (ref 20–32)
Calcium: 10.1 mg/dL (ref 8.6–10.3)
Chloride: 100 mmol/L (ref 98–110)
Creat: 0.85 mg/dL (ref 0.60–1.35)
GFR, Est African American: 130 mL/min/{1.73_m2} (ref >=60)
GFR, Est Non African American: 112 mL/min/{1.73_m2} (ref >=60)
Globulin: 2.8 g/dL (calc) (ref 1.9–3.7)
Glucose, Bld: 99 mg/dL (ref 65–99)
Potassium: 3.8 mmol/L (ref 3.5–5.3)
Sodium: 138 mmol/L (ref 135–146)
Total Bilirubin: 0.5 mg/dL (ref 0.2–1.2)
Total Protein: 7.8 g/dL (ref 6.1–8.1)

## 2018-10-11 LAB — VITAMIN D 25 HYDROXY (VIT D DEFICIENCY, FRACTURES): Vit D, 25-Hydroxy: 77 ng/mL (ref 30–100)

## 2018-10-11 LAB — TSH: TSH: 1.9 m[IU]/L (ref 0.40–4.50)

## 2018-10-11 LAB — SAR COV2 SEROLOGY (COVID19)AB(IGG),IA: SARS CoV2 AB IGG: NEGATIVE

## 2018-10-11 LAB — HEMOGLOBIN A1C
Hgb A1c MFr Bld: 5.3 %{Hb}
Mean Plasma Glucose: 105 (calc)
eAG (mmol/L): 5.8 (calc)

## 2018-10-11 LAB — MAGNESIUM: Magnesium: 2.1 mg/dL (ref 1.5–2.5)

## 2018-10-11 LAB — INSULIN, RANDOM: Insulin: 34.4 u[IU]/mL — ABNORMAL HIGH

## 2018-10-12 DIAGNOSIS — M9907 Segmental and somatic dysfunction of upper extremity: Secondary | ICD-10-CM | POA: Diagnosis not present

## 2018-10-12 DIAGNOSIS — M9901 Segmental and somatic dysfunction of cervical region: Secondary | ICD-10-CM | POA: Diagnosis not present

## 2018-10-12 DIAGNOSIS — M7541 Impingement syndrome of right shoulder: Secondary | ICD-10-CM | POA: Diagnosis not present

## 2018-10-12 DIAGNOSIS — M9902 Segmental and somatic dysfunction of thoracic region: Secondary | ICD-10-CM | POA: Diagnosis not present

## 2018-10-14 ENCOUNTER — Encounter: Payer: Self-pay | Admitting: Internal Medicine

## 2018-10-22 ENCOUNTER — Other Ambulatory Visit: Payer: Self-pay

## 2018-10-22 DIAGNOSIS — F988 Other specified behavioral and emotional disorders with onset usually occurring in childhood and adolescence: Secondary | ICD-10-CM

## 2018-10-22 MED ORDER — AMPHETAMINE-DEXTROAMPHETAMINE 20 MG PO TABS: ORAL_TABLET | ORAL | 0 refills | Status: DC

## 2018-11-12 DIAGNOSIS — M9907 Segmental and somatic dysfunction of upper extremity: Secondary | ICD-10-CM | POA: Diagnosis not present

## 2018-11-12 DIAGNOSIS — M9902 Segmental and somatic dysfunction of thoracic region: Secondary | ICD-10-CM | POA: Diagnosis not present

## 2018-11-12 DIAGNOSIS — M9901 Segmental and somatic dysfunction of cervical region: Secondary | ICD-10-CM | POA: Diagnosis not present

## 2018-11-12 DIAGNOSIS — M7541 Impingement syndrome of right shoulder: Secondary | ICD-10-CM | POA: Diagnosis not present

## 2018-11-20 ENCOUNTER — Other Ambulatory Visit: Payer: Self-pay

## 2018-11-20 DIAGNOSIS — F988 Other specified behavioral and emotional disorders with onset usually occurring in childhood and adolescence: Secondary | ICD-10-CM

## 2018-11-20 MED ORDER — AMPHETAMINE-DEXTROAMPHETAMINE 20 MG PO TABS: ORAL_TABLET | ORAL | 0 refills | Status: DC

## 2018-12-18 ENCOUNTER — Other Ambulatory Visit: Payer: Self-pay | Admitting: Internal Medicine

## 2018-12-18 MED ORDER — CITALOPRAM HYDROBROMIDE 40 MG PO TABS: ORAL_TABLET | ORAL | 3 refills | Status: DC

## 2018-12-19 ENCOUNTER — Other Ambulatory Visit: Payer: Self-pay

## 2018-12-19 DIAGNOSIS — F988 Other specified behavioral and emotional disorders with onset usually occurring in childhood and adolescence: Secondary | ICD-10-CM

## 2018-12-19 MED ORDER — AMPHETAMINE-DEXTROAMPHETAMINE 20 MG PO TABS: ORAL_TABLET | ORAL | 0 refills | Status: DC

## 2019-01-21 ENCOUNTER — Other Ambulatory Visit: Payer: Self-pay

## 2019-01-21 DIAGNOSIS — F988 Other specified behavioral and emotional disorders with onset usually occurring in childhood and adolescence: Secondary | ICD-10-CM

## 2019-01-22 ENCOUNTER — Encounter: Payer: Self-pay | Admitting: Internal Medicine

## 2019-01-22 MED ORDER — AMPHETAMINE-DEXTROAMPHETAMINE 20 MG PO TABS: ORAL_TABLET | ORAL | 0 refills | Status: DC

## 2019-02-18 ENCOUNTER — Other Ambulatory Visit: Payer: Self-pay | Admitting: Internal Medicine

## 2019-02-18 DIAGNOSIS — F988 Other specified behavioral and emotional disorders with onset usually occurring in childhood and adolescence: Secondary | ICD-10-CM

## 2019-02-19 ENCOUNTER — Other Ambulatory Visit: Payer: Self-pay

## 2019-02-19 DIAGNOSIS — F988 Other specified behavioral and emotional disorders with onset usually occurring in childhood and adolescence: Secondary | ICD-10-CM

## 2019-02-19 MED ORDER — AMPHETAMINE-DEXTROAMPHETAMINE 20 MG PO TABS: ORAL_TABLET | ORAL | 0 refills | Status: DC

## 2019-02-28 ENCOUNTER — Encounter: Payer: Self-pay | Admitting: Internal Medicine

## 2019-03-22 ENCOUNTER — Other Ambulatory Visit: Payer: Self-pay

## 2019-03-22 DIAGNOSIS — F988 Other specified behavioral and emotional disorders with onset usually occurring in childhood and adolescence: Secondary | ICD-10-CM

## 2019-03-22 MED ORDER — AMPHETAMINE-DEXTROAMPHETAMINE 20 MG PO TABS: ORAL_TABLET | ORAL | 0 refills | Status: DC

## 2019-04-21 ENCOUNTER — Other Ambulatory Visit: Payer: Self-pay | Admitting: Internal Medicine

## 2019-04-21 DIAGNOSIS — E782 Mixed hyperlipidemia: Secondary | ICD-10-CM

## 2019-04-21 MED ORDER — ATORVASTATIN CALCIUM 80 MG PO TABS: ORAL_TABLET | ORAL | 3 refills | Status: DC

## 2019-04-22 ENCOUNTER — Other Ambulatory Visit: Payer: Self-pay

## 2019-04-22 DIAGNOSIS — F988 Other specified behavioral and emotional disorders with onset usually occurring in childhood and adolescence: Secondary | ICD-10-CM

## 2019-04-22 MED ORDER — AMPHETAMINE-DEXTROAMPHETAMINE 20 MG PO TABS: ORAL_TABLET | ORAL | 0 refills | Status: DC

## 2019-05-02 ENCOUNTER — Encounter: Payer: Self-pay | Admitting: Internal Medicine

## 2019-05-08 ENCOUNTER — Encounter: Payer: Self-pay | Admitting: Internal Medicine

## 2019-05-08 NOTE — Patient Instructions (Signed)
Vit D  & Vit C 1,000 mg   are recommended to help protect  against the Covid-19 and other Corona viruses.    Also it's recommended  to take  Zinc 50 mg  to help  protect against the Covid-19   and best place to get  is also on [email or web address]  and don't pay more than 6-8 cents /pill !  ================================= Coronavirus (COVID-19) Are you at risk?  Are you at risk for the Coronavirus (COVID-19)?  To be considered HIGH RISK for Coronavirus (COVID-19), you have to meet the following criteria:  . Traveled to [place], [place], [place], [place] or [place]; or in the [place] to [place], [place], [place]  . or [place]; and have fever, cough, and shortness of breath within the last 2 weeks of travel OR . Been in close contact with a person diagnosed with COVID-19 within the last 2 weeks and have  . fever, cough,and shortness of breath .  . IF YOU DO NOT MEET THESE CRITERIA, YOU ARE CONSIDERED LOW RISK FOR COVID-19.  What to do if you are HIGH RISK for COVID-19?  Marland Kitchen If you are having a medical emergency, call 911. . Seek medical care right away. Before you go to a doctor's office, urgent care or emergency department, .  call ahead and tell them about your recent travel, contact with someone diagnosed with COVID-19  .  and your symptoms.  . You should receive instructions from your physician's office regarding next steps of care.  . When you arrive at healthcare provider, tell the healthcare staff immediately you have returned from  . visiting [place], [place], [place], [place] or [place]; or traveled in the [place] to [place], [place],  . [place] or [place] in the last two weeks or you have been in close contact with a person diagnosed with  . COVID-19 in the last 2 weeks.   . Tell the health care staff about your symptoms: fever, cough and shortness of breath. . After you have been seen by a medical provider, you will be either: o Tested for (COVID-19)  and discharged home on quarantine except to seek medical care if  o symptoms worsen, and asked to  - Stay home and avoid contact with others until you get your results (4-5 days)  - Avoid travel on public transportation if possible (such as bus, train, or airplane) or o Sent to the Emergency Department by EMS for evaluation, COVID-19 testing  and  o possible admission depending on your condition and test results.  What to do if you are LOW RISK for COVID-19?  Reduce your risk of any infection by using the same precautions used for avoiding the common cold or flu:  Marland Kitchen Wash your hands often with soap and warm water for at least 20 seconds.  If soap and water are not readily available,  . use an alcohol-based hand sanitizer with at least 60% alcohol.  . If coughing or sneezing, cover your mouth and nose by coughing or sneezing into the elbow areas of your shirt or coat, .  into a tissue or into your sleeve (not your hands). . Avoid shaking hands with others and consider head nods or verbal greetings only. . Avoid touching your eyes, nose, or mouth with unwashed hands.  . Avoid close contact with people who are sick. . Avoid places or events with large numbers of people in one location, like concerts or sporting events. Marland Kitchen  Carefully consider travel plans you have or are making. . If you are planning any travel outside or inside the [place], visit the CDC's Travelers' Health webpage for the latest health notices. . If you have some symptoms but not all symptoms, continue to monitor at home and seek medical attention  . if your symptoms worsen. . If you are having a medical emergency, call 911. >>>>>>>>>>>>>>>>>>>>>>>> Preventive Care for Adults  A healthy lifestyle and preventive care can promote health and wellness. Preventive health guidelines for men include the following key practices:  A routine yearly physical is a good way to check with your health care provider about your health and  preventative screening. It is a chance to share any concerns and updates on your health and to receive a thorough exam.  Visit your dentist for a routine exam and preventative care every 6 months. Brush your teeth twice a day and floss once a day. Good oral hygiene prevents tooth decay and gum disease.  The frequency of eye exams is based on your age, health, family medical history, use of contact lenses, and other factors. Follow your health care provider's recommendations for frequency of eye exams.  Eat a healthy diet. Foods such as vegetables, fruits, whole grains, low-fat dairy products, and lean protein foods contain the nutrients you need without too many calories. Decrease your intake of foods high in solid fats, added sugars, and salt. Eat the right amount of calories for you. Get information about a proper diet from your health care provider, if necessary.  Regular physical exercise is one of the most important things you can do for your health. Most adults should get at least 150 minutes of moderate-intensity exercise (any activity that increases your heart rate and causes you to sweat) each week. In addition, most adults need muscle-strengthening exercises on 2 or more days a week.  Maintain a healthy weight. The body mass index (BMI) is a screening tool to identify possible weight problems. It provides an estimate of body fat based on height and weight. Your health care provider can find your BMI and can help you achieve or maintain a healthy weight. For adults 20 years and older:  A BMI below 18.5 is considered underweight.  A BMI of 18.5 to 24.9 is normal.  A BMI of 25 to 29.9 is considered overweight.  A BMI of 30 and above is considered obese.  Maintain normal blood lipids and cholesterol levels by exercising and minimizing your intake of saturated fat. Eat a balanced diet with plenty of fruit and vegetables. Blood tests for lipids and cholesterol should begin at age 62 and be  repeated every 5 years. If your lipid or cholesterol levels are high, you are over 50, or you are at high risk for heart disease, you may need your cholesterol levels checked more frequently. Ongoing high lipid and cholesterol levels should be treated with medicines if diet and exercise are not working.  If you smoke, find out from your health care provider how to quit. If you do not use tobacco, do not start.  Lung cancer screening is recommended for adults aged 75-80 years who are at high risk for developing lung cancer because of a history of smoking. A yearly low-dose CT scan of the lungs is recommended for people who have at least a 30-pack-year history of smoking and are a current smoker or have quit within the past 15 years. A pack year of smoking is smoking an average of 1  pack of cigarettes a day for 1 year (for example: 1 pack a day for 30 years or 2 packs a day for 15 years). Yearly screening should continue until the smoker has stopped smoking for at least 15 years. Yearly screening should be stopped for people who develop a health problem that would prevent them from having lung cancer treatment.  If you choose to drink alcohol, do not have more than 2 drinks per day. One drink is considered to be 12 ounces (355 mL) of beer, 5 ounces (148 mL) of wine, or 1.5 ounces (44 mL) of liquor.  High blood pressure causes heart disease and increases the risk of stroke. Your blood pressure should be checked. Ongoing high blood pressure should be treated with medicines, if weight loss and exercise are not effective.  If you are 45-79 years old, ask your health care provider if you should take aspirin to prevent heart disease.  Diabetes screening involves taking a blood sample to check your fasting blood sugar level. Testing should be considered at a younger age or be carried out more frequently if you are overweight and have at least 1 risk factor for diabetes.  Colorectal cancer can be detected and  often prevented. Most routine colorectal cancer screening begins at the age of 50 and continues through age 75. However, your health care provider may recommend screening at an earlier age if you have risk factors for colon cancer. On a yearly basis, your health care provider may provide home test kits to check for hidden blood in the stool. Use of a small camera at the end of a tube to directly examine the colon (sigmoidoscopy or colonoscopy) can detect the earliest forms of colorectal cancer. Talk to your health care provider about this at age 50, when routine screening begins. Direct exam of the colon should be repeated every 5-10 years through age 75, unless early forms of precancerous polyps or small growths are found.  Screening for abdominal aortic aneurysm (AAA)  are recommended for persons over age 50 who have history of hypertensionor who are current or former smokers.  Talk with your health care provider about prostate cancer screening.  Testicular cancer screening is recommended for adult males. Screening includes self-exam, a health care provider exam, and other screening tests. Consult with your health care provider about any symptoms you have or any concerns you have about testicular cancer.  Use sunscreen. Apply sunscreen liberally and repeatedly throughout the day. You should seek shade when your shadow is shorter than you. Protect yourself by wearing long sleeves, pants, a wide-brimmed hat, and sunglasses year round, whenever you are outdoors.  Once a month, do a whole-body skin exam, using a mirror to look at the skin on your back. Tell your health care provider about new moles, moles that have irregular borders, moles that are larger than a pencil eraser, or moles that have changed in shape or color.  Stay current with required vaccines (immunizations).  Influenza vaccine. All adults should be immunized every year.  Tetanus, diphtheria, and acellular pertussis (Td, Tdap) vaccine.  An adult who has not previously received Tdap or who does not know his vaccine status should receive 1 dose of Tdap. This initial dose should be followed by tetanus and diphtheria toxoids (Td) booster doses every 10 years. Adults with an unknown or incomplete history of completing a 3-dose immunization series with Td-containing vaccines should begin or complete a primary immunization series including a Tdap dose. Adults should receive a   Td booster every 10 years.  Zoster vaccine. One dose is recommended for adults aged 69 years or older unless certain conditions are present.    Pneumococcal 13-valent conjugate (PCV13) vaccine. When indicated, a person who is uncertain of his immunization history and has no record of immunization should receive the PCV13 vaccine. An adult aged 11 years or older who has certain medical conditions and has not been previously immunized should receive 1 dose of PCV13 vaccine. This PCV13 should be followed with a dose of pneumococcal polysaccharide (PPSV23) vaccine. The PPSV23 vaccine dose should be obtained at least 8 weeks after the dose of PCV13 vaccine. An adult aged 41 years or older who has certain medical conditions and previously received 1 or more doses of PPSV23 vaccine should receive 1 dose of PCV13. The PCV13 vaccine dose should be obtained 1 or more years after the last PPSV23 vaccine dose.    Pneumococcal polysaccharide (PPSV23) vaccine. When PCV13 is also indicated, PCV13 should be obtained first. All adults aged 90 years and older should be immunized. An adult younger than age 67 years who has certain medical conditions should be immunized. Any person who resides in a nursing home or long-term care facility should be immunized. An adult smoker should be immunized. People with an immunocompromised condition and certain other conditions should receive both PCV13 and PPSV23 vaccines. People with human immunodeficiency virus (HIV) infection should be immunized as  soon as possible after diagnosis. Immunization during chemotherapy or radiation therapy should be avoided. Routine use of PPSV23 vaccine is not recommended for American Indians, [place] Natives, or people younger than 65 years unless there are medical conditions that require PPSV23 vaccine. When indicated, people who have unknown immunization and have no record of immunization should receive PPSV23 vaccine. One-time revaccination 5 years after the first dose of PPSV23 is recommended for people aged 19-64 years who have chronic kidney failure, nephrotic syndrome, asplenia, or immunocompromised conditions. People who received 1-2 doses of PPSV23 before age 57 years should receive another dose of PPSV23 vaccine at age 29 years or later if at least 5 years have passed since the previous dose. Doses of PPSV23 are not needed for people immunized with PPSV23 at or after age 48 years.  Hepatitis A vaccine. Adults who wish to be protected from this disease, have certain high-risk conditions, work with hepatitis A-infected animals, work in hepatitis A research labs, or travel to or work in countries with a high rate of hepatitis A should be immunized. Adults who were previously unvaccinated and who anticipate close contact with an international adoptee during the first 60 days after arrival in the [place] from a country with a high rate of hepatitis A should be immunized.  Hepatitis B vaccine. Adults should be immunized if they wish to be protected from this disease, have certain high-risk conditions, may be exposed to blood or other infectious body fluids, are household contacts or sex partners of hepatitis B positive people, are clients or workers in certain care facilities, or travel to or work in countries with a high rate of hepatitis B.  Preventive Service / Frequency  Ages 40 to 19  Blood pressure check.  Lipid and cholesterol check.  Hepatitis C blood test.** / For any individual with known risks  for hepatitis C.  Skin self-exam. / Monthly.  Influenza vaccine. / Every year.  Tetanus, diphtheria, and acellular pertussis (Tdap, Td) vaccine.** / Consult your health care provider. 1 dose of Td every 10 years.  HPV vaccine. / 3 doses over 6 months, if 43 or younger.  Measles, mumps, rubella (MMR) vaccine.** / You need at least 1 dose of MMR if you were born in 1957 or later. You may also need a second dose.  Pneumococcal 13-valent conjugate (PCV13) vaccine.** / Consult your health care provider.  Pneumococcal polysaccharide (PPSV23) vaccine.** / 1 to 2 doses if you smoke cigarettes or if you have certain conditions.  Meningococcal vaccine.** / 1 dose if you are age 35 to 96 years and a Market researcher living in a residence hall, or have one of several medical conditions. You may also need additional booster doses.  Hepatitis A vaccine.** / Consult your health care provider.  Hepatitis B vaccine.** / Consult your health care provider. +++++++++ Recommend Adult Low Dose Aspirin or  coated  Aspirin 81 mg daily  To reduce risk of Colon Cancer 40 %,  Skin Cancer 26 % ,  Melanoma 46%  and  Pancreatic cancer 60% ++++++++++++++++++ Vitamin D goal  is between 70-100.  Please make sure that you are taking your Vitamin D as directed.  It is very important as a natural anti-inflammatory  helping hair, skin, and nails, as well as reducing stroke and heart attack risk.  It helps your bones and helps with mood. It also decreases numerous cancer risks so please take it as directed.  Low Vit D is associated with a 200-300% higher risk for CANCER  and 200-300% higher risk for HEART   ATTACK  &  STROKE.   .....................................Marland Kitchen It is also associated with higher death rate at younger ages,  autoimmune diseases like Rheumatoid arthritis, Lupus, Multiple Sclerosis.    Also many other serious conditions, like depression, Alzheimer's Dementia, infertility, muscle  aches, fatigue, fibromyalgia - just to name a few. +++++++++++++++++++++ Recommend the book "The END of DIETING" by Dr Excell Seltzer  & the book "The END of DIABETES " by Dr Excell Seltzer At [email or web address].com - get book & Audio CD's    Being diabetic has a  300% increased risk for heart attack, stroke, cancer, and alzheimer- type vascular dementia. It is very important that you work harder with diet by avoiding all foods that are white. Avoid white rice (brown & wild rice is OK), white potatoes (sweetpotatoes in moderation is OK), White bread or wheat bread or anything made out of white flour like bagels, donuts, rolls, buns, biscuits, cakes, pastries, cookies, pizza crust, and pasta (made from white flour & egg whites) - vegetarian pasta or spinach or wheat pasta is OK. Multigrain breads like Arnold's or [employer], or multigrain sandwich thins or flatbreads.  Diet, exercise and weight loss can reverse and cure diabetes in the early stages.  Diet, exercise and weight loss is very important in the control and prevention of complications of diabetes which affects every system in your body, ie. Brain - dementia/stroke, eyes - glaucoma/blindness, heart - heart attack/heart failure, kidneys - dialysis, stomach - gastric paralysis, intestines - malabsorption, nerves - severe painful neuritis, circulation - gangrene & loss of a leg(s), and finally cancer and Alzheimers.    I recommend avoid fried & greasy foods,  sweets/candy, white rice (brown or wild rice or Quinoa is OK), white potatoes (sweet potatoes are OK) - anything made from white flour - bagels, doughnuts, rolls, buns, biscuits,white and wheat breads, pizza crust and traditional pasta made of white flour & egg white(vegetarian pasta or spinach or wheat pasta is OK).  Multi-grain bread is OK -  like multi-grain flat bread or sandwich thins. Avoid alcohol in excess. Exercise is also important.    Eat all the vegetables you want - avoid meat, especially red  meat and dairy - especially cheese.  Cheese is the most concentrated form of trans-fats which is the worst thing to clog up our arteries. Veggie cheese is OK which can be found in the fresh produce section at [employer] or [employer] or [employer]  +++++++++++++++++++ DASH Eating Plan  DASH stands for "Dietary Approaches to Stop Hypertension."   The DASH eating plan is a healthy eating plan that has been shown to reduce high blood pressure (hypertension). Additional health benefits may include reducing the risk of type 2 diabetes mellitus, heart disease, and stroke. The DASH eating plan may also help with weight loss. WHAT DO I NEED TO KNOW ABOUT THE DASH EATING PLAN? For the DASH eating plan, you will follow these general guidelines:  Choose foods with a percent daily value for sodium of less than 5% (as listed on the food label).  Use salt-free seasonings or herbs instead of table salt or sea salt.  Check with your health care provider or pharmacist before using salt substitutes.  Eat lower-sodium products, often labeled as "lower sodium" or "no salt added."  Eat fresh foods.  Eat more vegetables, fruits, and low-fat dairy products.  Choose whole grains. Look for the word "whole" as the first word in the ingredient list.  Choose fish   Limit sweets, desserts, sugars, and sugary drinks.  Choose heart-healthy fats.  Eat veggie cheese   Eat more home-cooked food and less restaurant, buffet, and fast food.  Limit fried foods.  Cook foods using methods other than frying.  Limit canned vegetables. If you do use them, rinse them well to decrease the sodium.  When eating at a restaurant, ask that your food be prepared with less salt, or no salt if possible.                      WHAT FOODS CAN I EAT? Read Dr Fara Olden Fuhrman's books on The End of Dieting & The End of Diabetes  Grains Whole grain or whole wheat bread. Brown rice. Whole grain or whole wheat pasta. Quinoa, bulgur,  and whole grain cereals. Low-sodium cereals. Corn or whole wheat flour tortillas. Whole grain cornbread. Whole grain crackers. Low-sodium crackers.  Vegetables Fresh or frozen vegetables (raw, steamed, roasted, or grilled). Low-sodium or reduced-sodium tomato and vegetable juices. Low-sodium or reduced-sodium tomato sauce and paste. Low-sodium or reduced-sodium canned vegetables.   Fruits All fresh, canned (in natural juice), or frozen fruits.  Protein Products  All fish and seafood.  Dried beans, peas, or lentils. Unsalted nuts and seeds. Unsalted canned beans.  Dairy Low-fat dairy products, such as skim or 1% milk, 2% or reduced-fat cheeses, low-fat ricotta or cottage cheese, or plain low-fat yogurt. Low-sodium or reduced-sodium cheeses.  Fats and Oils Tub margarines without trans fats. Light or reduced-fat mayonnaise and salad dressings (reduced sodium). Avocado. Safflower, olive, or canola oils. Natural peanut or almond butter.  Other Unsalted popcorn and pretzels. The items listed above may not be a complete list of recommended foods or beverages. Contact your dietitian for more options.  +++++++++++++++++++  WHAT FOODS ARE NOT RECOMMENDED? Grains/ White flour or wheat flour White bread. White pasta. White rice. Refined cornbread. Bagels and croissants. Crackers that contain trans fat.  Vegetables  Creamed or fried vegetables. Vegetables in a . Regular canned vegetables.  Regular canned tomato sauce and paste. Regular tomato and vegetable juices.  Fruits Dried fruits. Canned fruit in light or heavy syrup. Fruit juice.  Meat and Other Protein Products Meat in general - RED meat & White meat.  Fatty cuts of meat. Ribs, chicken wings, all processed meats as bacon, sausage, bologna, salami, fatback, hot dogs, bratwurst and packaged luncheon meats.  Dairy Whole or 2% milk, cream, half-and-half, and cream cheese. Whole-fat or sweetened yogurt. Full-fat cheeses or blue cheese.  Non-dairy creamers and whipped toppings. Processed cheese, cheese spreads, or cheese curds.  Condiments Onion and garlic salt, seasoned salt, table salt, and sea salt. Canned and packaged gravies. Worcestershire sauce. Tartar sauce. Barbecue sauce. Teriyaki sauce. Soy sauce, including reduced sodium. Steak sauce. Fish sauce. Oyster sauce. Cocktail sauce. Horseradish. Ketchup and mustard. Meat flavorings and tenderizers. Bouillon cubes. Hot sauce. Tabasco sauce. Marinades. Taco seasonings. Relishes.  Fats and Oils Butter, stick margarine, lard, shortening and bacon fat. Coconut, palm kernel, or palm oils. Regular salad dressings.  Pickles and olives. Salted popcorn and pretzels.  The items listed above may not be a complete list of foods and beverages to avoid.

## 2019-05-08 NOTE — Progress Notes (Signed)
Annual  Screening/Preventative Visit  & Comprehensive Evaluation & Examination     This very nice 39 y.o. MWM  presents for a Screening /Preventative Visit & comprehensive evaluation and management of multiple medical co-morbidities.  Patient has been followed for HTN, HLD, Prediabetes and Vitamin D Deficiency.  Patient has anxiety controlled on Citalopram and also has ADD  Improved on Wellbutrin & Adderall for his focus & concentyration. Patient does Loss adjuster, chartered for all [employer] & is concerned re: possible Covid exposure since he has a 81 yo son.      Labile HTN predates since 2016 and patient's  BP has been controlled.   Today's BP is high normal - 124/90. Patient denies any cardiac symptoms as chest pain, palpitations, shortness of breath, dizziness or ankle swelling.     [name] is controlled with diet and Atorvastatin. Patient denies myalgias or other medication SE's. Last lipids were not at goal:  Lab Results  Component Value Date   CHOL 180 10/10/2018   HDL 40 10/10/2018   LDLCALC 108 (H) 10/10/2018   TRIG 201 (H) 10/10/2018   CHOLHDL 4.5 10/10/2018      Patient has moderate obesity (BMI 33.32) and hx/o Prediabetes & Insulin Resistance (A1c 5.2% / Insulin 94 -2014 and A1c 5.6% / Insulin 46- 2017)  and patient denies reactive hypoglycemic symptoms, visual blurring, diabetic polys or paresthesias. Last A1c was Normal & at goal:  Lab Results  Component Value Date   HGBA1C 5.3 10/10/2018       Finally, patient has history of Vitamin D Deficiency and last vitamin D was at goal:  Lab Results  Component Value Date   VD25OH 77 10/10/2018   Current Outpatient Medications on File Prior to Visit  Medication Sig  . albuterol (PROVENTIL HFA;VENTOLIN HFA) 108 (90 Base) MCG/ACT inhaler Inhale 2 puffs into the lungs every 6 (six) hours as needed for wheezing or shortness of breath.  . amphetamine-dextroamphetamine (ADDERALL) 20 MG tablet 1/2 to 1  tablet 1 or 2 x /daily for ADD, should not take daily to avoid tolerance / addiction, script should last 30 days (til Jan 27)  . aspirin 81 MG tablet Take 81 mg by mouth daily.  Marland Kitchen atorvastatin (LIPITOR) 80 MG tablet Take 1 tablet Daily for Cholesterol  . buPROPion (WELLBUTRIN XL) 300 MG 24 hr tablet Take 1 tablet every Morning for Focus & Concentration  . Cholecalciferol (VITAMIN D PO) Take 10,000 Units by mouth daily.   . citalopram (CELEXA) 40 MG tablet Take 1 tablet Daily for Mood, Focus & Concentration  . minoxidil (ROGAINE) 2 % external solution Apply topically 2 (two) times daily.  . montelukast (SINGULAIR) 10 MG tablet TAKE 1 TABLET(10 MG) BY MOUTH DAILY  . multivitamin (ONE-A-DAY MEN'S) TABS tablet Take 1 tablet by mouth daily.  Marland Kitchen OVER THE COUNTER MEDICATION Allergra tab daily  . OVER THE COUNTER MEDICATION Takes OTC Zyrtec daily   No current facility-administered medications on file prior to visit.   No Known Allergies   Past Medical History:  Diagnosis Date  . ADD (attention deficit disorder)   . Allergy   . Anxiety   . Asthma   . Depression   . Hyperlipidemia   . Hypertension   . Insulin resistance    history of normal A1C but insulin 60, 45  . Vitamin D deficiency    Health Maintenance  Topic Date Due  . INFLUENZA VACCINE  11/24/2018  . TETANUS/TDAP  06/14/2023  . HIV  Screening  Completed   Immunization History  Administered Date(s) Administered  . Influenza Inj Mdck Quad With Preservative 02/09/2018  . PPD Test 06/13/2013, 06/23/2014, 07/24/2015, 11/28/2016, 02/09/2018  . Tdap 06/13/2013    Past Surgical History:  Procedure Laterality Date  . ELBOW SURGERY Left 1991   s/p trauma  . HERNIA REPAIR Right 2003   inguinal  . TONSILLECTOMY AND ADENOIDECTOMY     Family History  Problem Relation Age of Onset  . Cancer Mother        skin  . Neurofibromatosis Brother   . Diabetes Other   . Stroke Other    Social History   Socioeconomic History  .  Marital status: Married    Spouse name: Not on file  . Number of children: 1 son 60 yo  Occupational History  . Works in Engineer, technical sales setting up networks  Tobacco Use  . Smoking status: Never Smoker  . Smokeless tobacco: Never Used  Substance and Sexual Activity  . Alcohol use: Yes    Alcohol/week: 1.0 standard drinks    Types: 1 drink(s) per week  . Drug use: No  . Sexual activity: Yes     ROS Constitutional: Denies fever, chills, weight loss/gain, headaches, insomnia,  night sweats or change in appetite. Does c/o fatigue. Eyes: Denies redness, blurred vision, diplopia, discharge, itchy or watery eyes.  ENT: Denies discharge, congestion, post nasal drip, epistaxis, sore throat, earache, hearing loss, dental pain, Tinnitus, Vertigo, Sinus pain or snoring.  Cardio: Denies chest pain, palpitations, irregular heartbeat, syncope, dyspnea, diaphoresis, orthopnea, PND, claudication or edema Respiratory: denies cough, dyspnea, DOE, pleurisy, hoarseness, laryngitis or wheezing.  Gastrointestinal: Denies dysphagia, heartburn, reflux, water brash, pain, cramps, nausea, vomiting, bloating, diarrhea, constipation, hematemesis, melena, hematochezia, jaundice or hemorrhoids Genitourinary: Denies dysuria, frequency, urgency, nocturia, hesitancy, discharge, hematuria or flank pain Musculoskeletal: Denies arthralgia, myalgia, stiffness, Jt. Swelling, pain, limp or strain/sprain. Denies Falls. Skin: Denies puritis, rash, hives, warts, acne, eczema or change in skin lesion Neuro: No weakness, tremor, incoordination, spasms, paresthesia or pain Psychiatric: Denies confusion, memory loss or sensory loss. Denies Depression. Endocrine: Denies change in weight, skin, hair change, nocturia, and paresthesia, diabetic polys, visual blurring or hyper / hypo glycemic episodes.  Heme/Lymph: No excessive bleeding, bruising or enlarged lymph nodes.  Physical Exam  BP 124/90   Pulse 68   Temp (!) 97 F (36.1 C)   Resp 16    Ht 5\' 10"  (1.778 m)   Wt 232 lb 3.2 oz (105.3 kg)   BMI 33.32 kg/m   General Appearance: Well nourished and well groomed and in no apparent distress.  Eyes: PERRLA, EOMs, conjunctiva no swelling or erythema, normal fundi and vessels. Sinuses: No frontal/maxillary tenderness ENT/Mouth: EACs patent / TMs  nl. Nares clear without erythema, swelling, mucoid exudates. Oral hygiene is good. No erythema, swelling, or exudate. Tongue normal, non-obstructing. Tonsils not swollen or erythematous. Hearing normal.  Neck: Supple, thyroid not palpable. No bruits, nodes or JVD. Respiratory: Respiratory effort normal.  BS equal and clear bilateral without rales, rhonci, wheezing or stridor. Cardio: Heart sounds are normal with regular rate and rhythm and no murmurs, rubs or gallops. Peripheral pulses are normal and equal bilaterally without edema. No aortic or femoral bruits. Chest: symmetric with normal excursions and percussion.  Abdomen: Soft, with Nl bowel sounds. Nontender, no guarding, rebound, hernias, masses, or organomegaly.  Lymphatics: Non tender without lymphadenopathy.  Musculoskeletal: Full ROM all peripheral extremities, joint stability, 5/5 strength, and normal gait. Skin: Warm and dry  without rashes, lesions, cyanosis, clubbing or  ecchymosis.  Neuro: Cranial nerves intact, reflexes equal bilaterally. Normal muscle tone, no cerebellar symptoms. Sensation intact.  Pysch: Alert and oriented X 3 with normal affect, insight and judgment appropriate.   Assessment and Plan  1. Annual Preventative/Screening Exam   2. Labile hypertension  - EKG 12-Lead - Urinalysis, Routine w reflex microscopic - Microalbumin / Creatinine Urine Ratio - CBC with Diff - COMPLETE METABOLIC PANEL WITH GFR - Magnesium - TSH  3. Hyperlipidemia, mixed  - EKG 12-Lead - Lipid Profile - TSH  4. Abnormal glucose  - EKG 12-Lead - Hemoglobin A1c ([facility]) - Insulin, random  5. 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 Exposure to COVID-19 virus  - SAR CoV2 Serology (COVID 19)AB(IGG)IA  8. Testosterone deficiency  - Testosterone, Total  9. Screening examination for pulmonary tuberculosis  - TB Skin Test  10. Screening for colorectal cancer  - POC Hemoccult Bld/Stl   11. Screening for ischemic heart disease  - EKG 12-Lead  12. Family history of cerebrovascular event  - EKG 12-Lead  13. Fatigue  - Iron,Total/Total Iron Binding Cap - Vitamin B12 - Testosterone, Total - CBC with Diff - TSH  14. Medication management - Urinalysis, Routine w reflex microscopic - Microalbumin / Creatinine Urine Ratio          Patient was counseled in prudent diet, weight control to achieve/maintain BMI less than 25, BP monitoring, regular exercise and medications as discussed.  Given Rx for Topamax to compliment his Adderall in hopes of helping with weight loss.  Discussed med effects and SE's. Routine screening labs and tests as requested with regular follow-up as recommended. Over 40 minutes of exam, counseling, chart review and high complex critical decision making was performed   Marinus Maw, MD

## 2019-05-09 ENCOUNTER — Encounter: Payer: Self-pay | Admitting: Internal Medicine

## 2019-05-09 ENCOUNTER — Other Ambulatory Visit: Payer: Self-pay

## 2019-05-09 ENCOUNTER — Ambulatory Visit (INDEPENDENT_AMBULATORY_CARE_PROVIDER_SITE_OTHER): Payer: BC Managed Care – PPO | Admitting: Internal Medicine

## 2019-05-09 VITALS — BP 124/90 | HR 68 | Temp 97.0°F | Resp 16 | Ht 70.0 in | Wt 232.2 lb

## 2019-05-09 DIAGNOSIS — Z136 Encounter for screening for cardiovascular disorders: Secondary | ICD-10-CM | POA: Diagnosis not present

## 2019-05-09 DIAGNOSIS — Z20822 Contact with and (suspected) exposure to covid-19: Secondary | ICD-10-CM

## 2019-05-09 DIAGNOSIS — R5383 Other fatigue: Secondary | ICD-10-CM

## 2019-05-09 DIAGNOSIS — Z1389 Encounter for screening for other disorder: Secondary | ICD-10-CM | POA: Diagnosis not present

## 2019-05-09 DIAGNOSIS — Z0001 Encounter for general adult medical examination with abnormal findings: Secondary | ICD-10-CM

## 2019-05-09 DIAGNOSIS — Z79899 Other long term (current) drug therapy: Secondary | ICD-10-CM

## 2019-05-09 DIAGNOSIS — Z1212 Encounter for screening for malignant neoplasm of rectum: Secondary | ICD-10-CM

## 2019-05-09 DIAGNOSIS — Z111 Encounter for screening for respiratory tuberculosis: Secondary | ICD-10-CM

## 2019-05-09 DIAGNOSIS — Z130 Encounter for screening for diseases of the blood and blood-forming organs and certain disorders involving the immune mechanism: Secondary | ICD-10-CM | POA: Diagnosis not present

## 2019-05-09 DIAGNOSIS — Z0000 Encounter for general adult medical examination without abnormal findings: Secondary | ICD-10-CM

## 2019-05-09 DIAGNOSIS — Z1329 Encounter for screening for other suspected endocrine disorder: Secondary | ICD-10-CM

## 2019-05-09 DIAGNOSIS — F988 Other specified behavioral and emotional disorders with onset usually occurring in childhood and adolescence: Secondary | ICD-10-CM

## 2019-05-09 DIAGNOSIS — Z13220 Encounter for screening for lipoid disorders: Secondary | ICD-10-CM | POA: Diagnosis not present

## 2019-05-09 DIAGNOSIS — E782 Mixed hyperlipidemia: Secondary | ICD-10-CM

## 2019-05-09 DIAGNOSIS — E349 Endocrine disorder, unspecified: Secondary | ICD-10-CM

## 2019-05-09 DIAGNOSIS — E559 Vitamin D deficiency, unspecified: Secondary | ICD-10-CM

## 2019-05-09 DIAGNOSIS — R0989 Other specified symptoms and signs involving the circulatory and respiratory systems: Secondary | ICD-10-CM

## 2019-05-09 DIAGNOSIS — Z131 Encounter for screening for diabetes mellitus: Secondary | ICD-10-CM

## 2019-05-09 DIAGNOSIS — Z8249 Family history of ischemic heart disease and other diseases of the circulatory system: Secondary | ICD-10-CM | POA: Diagnosis not present

## 2019-05-09 DIAGNOSIS — R7309 Other abnormal glucose: Secondary | ICD-10-CM

## 2019-05-09 DIAGNOSIS — Z1211 Encounter for screening for malignant neoplasm of colon: Secondary | ICD-10-CM

## 2019-05-09 DIAGNOSIS — E6609 Other obesity due to excess calories: Secondary | ICD-10-CM

## 2019-05-09 MED ORDER — TOPIRAMATE 50 MG PO TABS: ORAL_TABLET | ORAL | 1 refills | Status: DC

## 2019-05-10 LAB — SAR COV2 SEROLOGY (COVID19)AB(IGG),IA: SARS CoV2 AB IGG: NEGATIVE

## 2019-05-11 LAB — LIPID PANEL
Cholesterol: 158 mg/dL (ref <200)
HDL: 37 mg/dL — ABNORMAL LOW (ref >=40)
LDL Cholesterol (Calc): 96 mg/dL (calc)
Non-HDL Cholesterol (Calc): 121 mg/dL (calc) (ref <130)
Total CHOL/HDL Ratio: 4.3 (calc) (ref <5.0)
Triglycerides: 158 mg/dL — ABNORMAL HIGH (ref <150)

## 2019-05-11 LAB — CBC WITH DIFFERENTIAL/PLATELET
Absolute Monocytes: 449 cells/uL (ref 200–950)
Basophils Absolute: 73 cells/uL (ref 0–200)
Basophils Relative: 1.1 %
Eosinophils Absolute: 442 cells/uL (ref 15–500)
Eosinophils Relative: 6.7 %
HCT: 42.9 % (ref 38.5–50.0)
Hemoglobin: 14.5 g/dL (ref 13.2–17.1)
Lymphs Abs: 2086 cells/uL (ref 850–3900)
MCH: 29.1 pg (ref 27.0–33.0)
MCHC: 33.8 g/dL (ref 32.0–36.0)
MCV: 86.1 fL (ref 80.0–100.0)
MPV: 10.5 fL (ref 7.5–12.5)
Monocytes Relative: 6.8 %
Neutro Abs: 3551 cells/uL (ref 1500–7800)
Neutrophils Relative %: 53.8 %
Platelets: 318 10*3/uL (ref 140–400)
RBC: 4.98 10*6/uL (ref 4.20–5.80)
RDW: 12.1 % (ref 11.0–15.0)
Total Lymphocyte: 31.6 %
WBC: 6.6 10*3/uL (ref 3.8–10.8)

## 2019-05-11 LAB — URINALYSIS, ROUTINE W REFLEX MICROSCOPIC
Bilirubin Urine: NEGATIVE
Glucose, UA: NEGATIVE
Hgb urine dipstick: NEGATIVE
Ketones, ur: NEGATIVE
Leukocytes,Ua: NEGATIVE
Nitrite: NEGATIVE
Protein, ur: NEGATIVE
Specific Gravity, Urine: 1.022 (ref 1.001–1.03)
pH: 6 (ref 5.0–8.0)

## 2019-05-11 LAB — IRON, TOTAL/TOTAL IRON BINDING CAP
%SAT: 41 % (calc) (ref 20–48)
Iron: 147 ug/dL (ref 50–180)
TIBC: 362 mcg/dL (calc) (ref 250–425)

## 2019-05-11 LAB — COMPLETE METABOLIC PANEL WITH GFR
AG Ratio: 1.8 (calc) (ref 1.0–2.5)
ALT: 47 U/L — ABNORMAL HIGH (ref 9–46)
AST: 28 U/L (ref 10–40)
Albumin: 4.8 g/dL (ref 3.6–5.1)
Alkaline phosphatase (APISO): 77 U/L (ref 36–130)
BUN: 14 mg/dL (ref 7–25)
CO2: 30 mmol/L (ref 20–32)
Calcium: 10.1 mg/dL (ref 8.6–10.3)
Chloride: 100 mmol/L (ref 98–110)
Creat: 0.94 mg/dL (ref 0.60–1.35)
GFR, Est African American: 120 mL/min/{1.73_m2} (ref >=60)
GFR, Est Non African American: 103 mL/min/{1.73_m2} (ref >=60)
Globulin: 2.7 g/dL (calc) (ref 1.9–3.7)
Glucose, Bld: 120 mg/dL — ABNORMAL HIGH (ref 65–99)
Potassium: 4.5 mmol/L (ref 3.5–5.3)
Sodium: 138 mmol/L (ref 135–146)
Total Bilirubin: 0.8 mg/dL (ref 0.2–1.2)
Total Protein: 7.5 g/dL (ref 6.1–8.1)

## 2019-05-11 LAB — TESTOSTERONE: Testosterone: 277 ng/dL (ref 250–827)

## 2019-05-11 LAB — VITAMIN B12: Vitamin B-12: 1174 pg/mL — ABNORMAL HIGH (ref 200–1100)

## 2019-05-11 LAB — HEMOGLOBIN A1C
Hgb A1c MFr Bld: 5.5 % of total Hgb (ref <5.7)
Mean Plasma Glucose: 111 (calc)
eAG (mmol/L): 6.2 (calc)

## 2019-05-11 LAB — MICROALBUMIN / CREATININE URINE RATIO
Creatinine, Urine: 258 mg/dL (ref 20–320)
Microalb Creat Ratio: 3 mcg/mg creat (ref <30)
Microalb, Ur: 0.9 mg/dL

## 2019-05-11 LAB — MAGNESIUM: Magnesium: 2.2 mg/dL (ref 1.5–2.5)

## 2019-05-11 LAB — VITAMIN D 25 HYDROXY (VIT D DEFICIENCY, FRACTURES): Vit D, 25-Hydroxy: 88 ng/mL (ref 30–100)

## 2019-05-11 LAB — TSH: TSH: 1.04 mIU/L (ref 0.40–4.50)

## 2019-05-11 LAB — INSULIN, RANDOM: Insulin: 45.2 u[IU]/mL — ABNORMAL HIGH

## 2019-05-19 ENCOUNTER — Other Ambulatory Visit: Payer: Self-pay

## 2019-05-19 DIAGNOSIS — F988 Other specified behavioral and emotional disorders with onset usually occurring in childhood and adolescence: Secondary | ICD-10-CM

## 2019-05-20 MED ORDER — AMPHETAMINE-DEXTROAMPHETAMINE 20 MG PO TABS: ORAL_TABLET | ORAL | 0 refills | Status: DC

## 2019-06-18 ENCOUNTER — Other Ambulatory Visit: Payer: Self-pay | Admitting: *Deleted

## 2019-06-18 ENCOUNTER — Other Ambulatory Visit: Payer: Self-pay

## 2019-06-18 DIAGNOSIS — F988 Other specified behavioral and emotional disorders with onset usually occurring in childhood and adolescence: Secondary | ICD-10-CM

## 2019-06-18 MED ORDER — AMPHETAMINE-DEXTROAMPHETAMINE 20 MG PO TABS: ORAL_TABLET | ORAL | 0 refills | Status: DC

## 2019-06-18 MED ORDER — MONTELUKAST SODIUM 10 MG PO TABS: ORAL_TABLET | ORAL | 3 refills | Status: DC

## 2019-07-16 ENCOUNTER — Other Ambulatory Visit: Payer: Self-pay

## 2019-07-16 DIAGNOSIS — F988 Other specified behavioral and emotional disorders with onset usually occurring in childhood and adolescence: Secondary | ICD-10-CM

## 2019-07-17 ENCOUNTER — Other Ambulatory Visit: Payer: Self-pay | Admitting: Internal Medicine

## 2019-07-17 MED ORDER — AMPHETAMINE-DEXTROAMPHETAMINE 20 MG PO TABS: ORAL_TABLET | ORAL | 0 refills | Status: DC

## 2019-08-07 DIAGNOSIS — E66811 Obesity, class 1: Secondary | ICD-10-CM | POA: Insufficient documentation

## 2019-08-07 DIAGNOSIS — E669 Obesity, unspecified: Secondary | ICD-10-CM | POA: Insufficient documentation

## 2019-08-07 NOTE — Progress Notes (Deleted)
Assessment and Plan:  Diagnoses and all orders for this visit:  Labile hypertension  Mild intermittent asthma without complication  Insulin resistance  Vitamin D deficiency  Hyperlipidemia, mixed  Major depressive disorder in partial remission, unspecified whether recurrent (HCC)  Attention deficit disorder (ADD) without hyperactivity  Obesity (BMI 30.0-34.9)     Further disposition pending results of labs. Discussed med's effects and SE's.   Over 30 minutes of exam, counseling, chart review, and critical decision making was performed.   Future Appointments  Date Time Provider [place]  08/12/2019 11:30 AM Liane Comber, NP [facility] None  11/14/2019 11:30 AM Unk Pinto, MD [facility] None  05/15/2020 10:00 AM Unk Pinto, MD [facility] None    ------------------------------------------------------------------------------------------------------------------   HPI There were no vitals taken for this visit.  39 y.o.male presents for 3 month follow up on labile hypertension, insulin resistance, obesity, depression, ADD, asthma, vitamin D deficiency.   Patient is on an ADD medication, he states that the medication is helping and he denies any adverse reactions. He is currently taking 20 mg adderall in the AM, then may take 1/2 tab at lunch and at 3 PM if needed for long work hours.   He has seasonal allergies, mild intermittent asthma currently treated by singulaire, PRN albuterol ***  BMI is There is no height or weight on file to calculate BMI., he {HAS HAS VQQ:59563} been working on diet and exercise. He is on topamax for weight *** Wt Readings from Last 3 Encounters:  05/09/19 232 lb 3.2 oz (105.3 kg)  10/10/18 225 lb 12.8 oz (102.4 kg)  06/15/18 229 lb (103.9 kg)   His blood pressure {HAS HAS NOT:18834} been controlled at home, today their BP is    He {DOES_DOES OVF:64332} workout. He denies chest pain, shortness of breath, dizziness.   He is  on cholesterol medication (atorvastatin 80 mg daily ***) and denies myalgias. His cholesterol is at LDL goal of <100, trigs remain mildly elevated. The cholesterol last visit was:   Lab Results  Component Value Date   CHOL 158 05/09/2019   HDL 37 (L) 05/09/2019   LDLCALC 96 05/09/2019   TRIG 158 (H) 05/09/2019   CHOLHDL 4.3 05/09/2019    He {Has/has not:18111} been working on diet and exercise for hx of insulin resistance (58.7 in 2016, 45.2 in 2021), and denies {Symptoms; diabetes w/o none:19199}. Last A1C in the office was:  Lab Results  Component Value Date   HGBA1C 5.5 05/09/2019   Patient is on Vitamin D supplement.   Lab Results  Component Value Date   VD25OH 62 05/09/2019       He has a history of testosterone deficiency though not currently on supplement ***. He states that the testosterone helps with his energy, libido, muscle mass. Denies symptoms of elevated estrogen or dihydrotestosterone.  Lab Results  Component Value Date   TESTOSTERONE 277 05/09/2019      Past Medical History:  Diagnosis Date  . ADD (attention deficit disorder)   . Allergy   . Anxiety   . Asthma   . Depression   . Hyperlipidemia   . Hypertension   . Insulin resistance    history of normal A1C but insulin 60, 45  . Vitamin D deficiency      No Known Allergies  Current Outpatient Medications on File Prior to Visit  Medication Sig  . albuterol (PROVENTIL HFA;VENTOLIN HFA) 108 (90 Base) MCG/ACT inhaler Inhale 2 puffs into the lungs every 6 (six) hours as  needed for wheezing or shortness of breath.  . amphetamine-dextroamphetamine (ADDERALL) 20 MG tablet 1/2 to 1 tablet 1 or 2 x /daily for ADD, should not take daily to avoid tolerance / addiction - script should last 30 days (til Apr 23)  . aspirin 81 MG tablet Take 81 mg by mouth daily.  Marland Kitchen atorvastatin (LIPITOR) 80 MG tablet Take 1 tablet Daily for Cholesterol  . buPROPion (WELLBUTRIN XL) 300 MG 24 hr tablet Take 1 tablet every Morning for  Focus & Concentration  . Cholecalciferol (VITAMIN D PO) Take 10,000 Units by mouth daily.   . citalopram (CELEXA) 40 MG tablet TAKE 1 TABLET BY MOUTH EVERY DAY  . minoxidil (ROGAINE) 2 % external solution Apply topically 2 (two) times daily.  . montelukast (SINGULAIR) 10 MG tablet TAKE 1 TABLET(10 MG) DAILY FOR ALLERGIES.  Marland Kitchen multivitamin (ONE-A-DAY MEN'S) TABS tablet Take 1 tablet by mouth daily.  Marland Kitchen OVER THE COUNTER MEDICATION Allergra tab daily  . OVER THE COUNTER MEDICATION Takes OTC Zyrtec daily  . topiramate (TOPAMAX) 50 MG tablet Take 1/2 to 1 tablet 2 x /day at Suppertime & Bedtime for Dieting & Weight Loss   No current facility-administered medications on file prior to visit.    ROS: Review of Systems  Constitutional: Negative for chills, diaphoresis, fever, malaise/fatigue and weight loss.  HENT: Negative for congestion and sore throat.   Eyes: Negative for blurred vision and double vision.  Respiratory: Negative for cough, hemoptysis, sputum production, shortness of breath and wheezing.   Cardiovascular: Negative for chest pain, palpitations, orthopnea, claudication, leg swelling and PND.  Gastrointestinal: Negative for diarrhea, heartburn, nausea and vomiting.  Musculoskeletal: Negative for falls, joint pain and myalgias.  Skin: Negative for rash.  Neurological: Negative for dizziness and headaches.  Endo/Heme/Allergies: Negative for environmental allergies.  Psychiatric/Behavioral: Negative for depression. The patient is not nervous/anxious and does not have insomnia.      Physical Exam:  There were no vitals taken for this visit.  General Appearance: Well nourished, in no apparent distress. Eyes: PERRLA, EOMs, conjunctiva no swelling or erythema Sinuses: No Frontal/maxillary tenderness ENT/Mouth: Ext aud canals clear, TMs without erythema, bulging. No erythema, swelling, or exudate on post pharynx.  Tonsils not swollen or erythematous. Hearing normal.  Neck: Supple,  thyroid normal.  Respiratory: Respiratory effort normal, BS equal bilaterally without rales, rhonchi, wheezing or stridor, though somewhat coarse throughout.  Cardio: RRR with no MRGs. Brisk peripheral pulses without edema.  Abdomen: Soft, + BS.  Non tender, no guarding, rebound, hernias, masses. Lymphatics: Non tender without lymphadenopathy.  Musculoskeletal: Full ROM, 5/5 strength, normal gait.  Skin: Warm, dry without rashes, lesions, ecchymosis.  Neuro: Cranial nerves intact. Normal muscle tone, no cerebellar symptoms. Sensation intact.  Psych: Awake and oriented X 3, normal affect, Insight and Judgment appropriate.     Dan Maker, NP 1:39 PM [facility]

## 2019-08-12 ENCOUNTER — Ambulatory Visit: Payer: BC Managed Care – PPO | Admitting: Adult Health

## 2019-08-16 ENCOUNTER — Other Ambulatory Visit: Payer: Self-pay

## 2019-08-16 DIAGNOSIS — F988 Other specified behavioral and emotional disorders with onset usually occurring in childhood and adolescence: Secondary | ICD-10-CM

## 2019-08-16 MED ORDER — AMPHETAMINE-DEXTROAMPHETAMINE 20 MG PO TABS: ORAL_TABLET | ORAL | 0 refills | Status: DC

## 2019-09-17 ENCOUNTER — Other Ambulatory Visit: Payer: Self-pay

## 2019-09-17 DIAGNOSIS — F988 Other specified behavioral and emotional disorders with onset usually occurring in childhood and adolescence: Secondary | ICD-10-CM

## 2019-09-17 MED ORDER — AMPHETAMINE-DEXTROAMPHETAMINE 20 MG PO TABS: ORAL_TABLET | ORAL | 0 refills | Status: DC

## 2019-10-16 ENCOUNTER — Other Ambulatory Visit: Payer: Self-pay

## 2019-10-16 DIAGNOSIS — F988 Other specified behavioral and emotional disorders with onset usually occurring in childhood and adolescence: Secondary | ICD-10-CM

## 2019-10-16 MED ORDER — AMPHETAMINE-DEXTROAMPHETAMINE 20 MG PO TABS: ORAL_TABLET | ORAL | 0 refills | Status: DC

## 2019-11-13 ENCOUNTER — Encounter: Payer: Self-pay | Admitting: Internal Medicine

## 2019-11-13 NOTE — Patient Instructions (Signed)

## 2019-11-13 NOTE — Progress Notes (Signed)
History of Present Illness:       This very nice 39 y.o.  MWM presents for 6 month follow up with HTN, HLD, Pre-Diabetes and Vitamin D Deficiency.  Patient has ADD on Adderall & Bupropion with improved work performance -  focus & concentration.        Patient has moderate Obesity - 34+ and is interested in trying [facility] for weight loss.       Patient has hx/o low Testosterone - 254 in 2018 and more recently - 277 in January and for the last month has been taking Tribulus  A herbal supplement for Testosterone.         Patient is monitored expectantly for labile HTN since 2016  & BP has been controlled. Today's BP is at goal - 118/76. Patient has had no complaints of any cardiac type chest pain, palpitations, dyspnea / orthopnea / PND, dizziness, claudication, or dependent edema.      Hyperlipidemia is controlled with diet & Atorvastatin.  Patient denies myalgias or other med SE's. Last Lipids were at goal:  Lab Results  Component Value Date   CHOL 158 05/09/2019   HDL 37 (L) 05/09/2019   LDLCALC 96 05/09/2019   TRIG 158 (H) 05/09/2019   CHOLHDL 4.3 05/09/2019    Also, the patient has history of Prediabetes & Insulin Resistance(A1c 5.2%/Insulin 94-2014 and A1c 5.6% / Insulin 46-2017) and has had no symptoms of reactive hypoglycemia, diabetic polys, paresthesias or visual blurring.  Last A1c was Normal & at goal:  Lab Results  Component Value Date   HGBA1C 5.5 05/09/2019           Further, the patient also has history of Vitamin D Deficiency and supplements vitamin D without any suspected side-effects. Last vitamin D was at goal:  Lab Results  Component Value Date   VD25OH 88 05/09/2019    Current Outpatient Medications on File Prior to Visit  Medication Sig  . albuterol (PROVENTIL HFA;VENTOLIN HFA) 108 (90 Base) MCG/ACT inhaler Inhale 2 puffs into the lungs every 6 (six) hours as needed for wheezing or shortness of breath.  Marland Kitchen aspirin 81 MG tablet Take 81 mg by  mouth daily.  Marland Kitchen atorvastatin (LIPITOR) 80 MG tablet Take 1 tablet Daily for Cholesterol  . buPROPion (WELLBUTRIN XL) 300 MG 24 hr tablet Take 1 tablet every Morning for Focus & Concentration  . Cholecalciferol (VITAMIN D PO) Take 10,000 Units by mouth daily.   . citalopram (CELEXA) 40 MG tablet TAKE 1 TABLET BY MOUTH EVERY DAY  . minoxidil (ROGAINE) 2 % external solution Apply topically 2 (two) times daily.  . montelukast (SINGULAIR) 10 MG tablet TAKE 1 TABLET(10 MG) DAILY FOR ALLERGIES.  Marland Kitchen multivitamin (ONE-A-DAY MEN'S) TABS tablet Take 1 tablet by mouth daily.  Marland Kitchen OVER THE COUNTER MEDICATION Allergra tab twice daily  . OVER THE COUNTER MEDICATION Takes OTC Zyrtec daily  . OVER THE COUNTER MEDICATION Tribulus, herbal supplement for increasing testosterone, 2 capsules daily.  Marland Kitchen topiramate (TOPAMAX) 50 MG tablet Take 1/2 to 1 tablet 2 x /day at Suppertime & Bedtime for Dieting & Weight Loss  . amphetamine-dextroamphetamine (ADDERALL) 20 MG tablet 1/2 to 1 tablet 1 or 2 x /daily for ADD, should not take daily to avoid tolerance / addiction - script should last 30 days (til June 24)   No current facility-administered medications on file prior to visit.    No Known Allergies  PMHx:   Past Medical History:  Diagnosis Date  . ADD (attention deficit disorder)   . Allergy   . Anxiety   . Asthma   . Depression   . Hyperlipidemia   . Hypertension   . Insulin resistance    history of normal A1C but insulin 60, 45  . Vitamin D deficiency     Immunization History  Administered Date(s) Administered  . Influenza Inj Mdck Quad With Preservative 02/09/2018  . Influenza,inj,Quad PF,6+ Mos 02/14/2019  . PPD Test 06/13/2013, 06/23/2014, 07/24/2015, 11/28/2016, 02/09/2018, 05/09/2019  . Tdap 06/13/2013    Past Surgical History:  Procedure Laterality Date  . ELBOW SURGERY Left 1991   s/p trauma  . HERNIA REPAIR Right 2003   inguinal  . TONSILLECTOMY AND ADENOIDECTOMY      FHx:    Reviewed  / unchanged  SHx:    Reviewed / unchanged   Systems Review:  Constitutional: Denies fever, chills, wt changes, headaches, insomnia, fatigue, night sweats, change in appetite. Eyes: Denies redness, blurred vision, diplopia, discharge, itchy, watery eyes.  ENT: Denies discharge, congestion, post nasal drip, epistaxis, sore throat, earache, hearing loss, dental pain, tinnitus, vertigo, sinus pain, snoring.  CV: Denies chest pain, palpitations, irregular heartbeat, syncope, dyspnea, diaphoresis, orthopnea, PND, claudication or edema. Respiratory: denies cough, dyspnea, DOE, pleurisy, hoarseness, laryngitis, wheezing.  Gastrointestinal: Denies dysphagia, odynophagia, heartburn, reflux, water brash, abdominal pain or cramps, nausea, vomiting, bloating, diarrhea, constipation, hematemesis, melena, hematochezia  or hemorrhoids. Genitourinary: Denies dysuria, frequency, urgency, nocturia, hesitancy, discharge, hematuria or flank pain. Musculoskeletal: Denies arthralgias, myalgias, stiffness, jt. swelling, pain, limping or strain/sprain.  Skin: Denies pruritus, rash, hives, warts, acne, eczema or change in skin lesion(s). Neuro: No weakness, tremor, incoordination, spasms, paresthesia or pain. Psychiatric: Denies confusion, memory loss or sensory loss. Endo: Denies change in weight, skin or hair change.  Heme/Lymph: No excessive bleeding, bruising or enlarged lymph nodes.  Physical Exam  BP 118/76   Pulse 88   Temp (!) 97.2 F (36.2 C)   Resp 18   Ht 5\' 10"  (1.778 m)   Wt (!) 237 lb 6.4 oz (107.7 kg)   BMI 34.06 kg/m   Appears  well nourished, well groomed  and in no distress.  Eyes: PERRLA, EOMs, conjunctiva no swelling or erythema. Sinuses: No frontal/maxillary tenderness ENT/Mouth: EAC's clear, TM's nl w/o erythema, bulging. Nares clear w/o erythema, swelling, exudates. Oropharynx clear without erythema or exudates. Oral hygiene is good. Tongue normal, non obstructing. Hearing intact.    Neck: Supple. Thyroid not palpable. Car 2+/2+ without bruits, nodes or JVD. Chest: Respirations nl with BS clear & equal w/o rales, rhonchi, wheezing or stridor.  Cor: Heart sounds normal w/ regular rate and rhythm without sig. murmurs, gallops, clicks or rubs. Peripheral pulses normal and equal  without edema.  Abdomen: Soft & bowel sounds normal. Non-tender w/o guarding, rebound, hernias, masses or organomegaly.  Lymphatics: Unremarkable.  Musculoskeletal: Full ROM all peripheral extremities, joint stability, 5/5 strength and normal gait.  Skin: Warm, dry without exposed rashes, lesions or ecchymosis apparent.  Neuro: Cranial nerves intact, reflexes equal bilaterally. Sensory-motor testing grossly intact. Tendon reflexes grossly intact.  Pysch: Alert & oriented x 3.  Insight and judgement nl & appropriate. No ideations.  Assessment and Plan:  1. Labile hypertension  - Continue medication, monitor blood pressure at home.  - Continue DASH diet.  Reminder to go to the ER if any CP,  SOB, nausea, dizziness, severe HA, changes vision/speech.  - CBC with Differential/Platelet - COMPLETE METABOLIC PANEL WITH GFR -  Magnesium - TSH  2. Hyperlipidemia, mixed  - Continue diet/meds, exercise,& lifestyle modifications.  - Continue monitor periodic cholesterol/liver & renal functions   - Lipid panel - TSH  3. Abnormal glucose  - Continue diet, exercise  - Lifestyle modifications.  - Monitor appropriate labs.  - Hemoglobin A1c - Insulin, random  4. Vitamin D deficiency  - Continue supplementation.  - VITAMIN D 25 Hydroxyl  5. Attention deficit disorder (ADD) without hyperactivity   6. Medication management  - CBC with Differential/Platelet - COMPLETE METABOLIC PANEL WITH GFR - Magnesium - Lipid panel - TSH - Hemoglobin A1c - Insulin, random - VITAMIN D 25 Hydroxy        Discussed  regular exercise, BP monitoring, weight control to achieve/maintain BMI less than 25 and  discussed med and SE's. Recommended labs to assess and monitor clinical status with further disposition pending results of labs.  I discussed the assessment and treatment plan with the patient. The patient was provided an opportunity to ask questions and all were answered. The patient agreed with the plan and demonstrated an understanding of the instructions.  I provided over 30 minutes of exam, counseling, chart review and  complex critical decision making.         The patient was advised to call back or seek an in-person evaluation if the symptoms worsen or if the condition fails to improve as anticipated.   Marinus Maw, MD

## 2019-11-14 ENCOUNTER — Ambulatory Visit (INDEPENDENT_AMBULATORY_CARE_PROVIDER_SITE_OTHER): Payer: BC Managed Care – PPO | Admitting: Internal Medicine

## 2019-11-14 ENCOUNTER — Other Ambulatory Visit: Payer: Self-pay

## 2019-11-14 VITALS — BP 118/76 | HR 88 | Temp 97.2°F | Resp 18 | Ht 70.0 in | Wt 237.4 lb

## 2019-11-14 DIAGNOSIS — E782 Mixed hyperlipidemia: Secondary | ICD-10-CM | POA: Diagnosis not present

## 2019-11-14 DIAGNOSIS — E559 Vitamin D deficiency, unspecified: Secondary | ICD-10-CM

## 2019-11-14 DIAGNOSIS — Z6835 Body mass index (BMI) 35.0-35.9, adult: Secondary | ICD-10-CM

## 2019-11-14 DIAGNOSIS — R0989 Other specified symptoms and signs involving the circulatory and respiratory systems: Secondary | ICD-10-CM

## 2019-11-14 DIAGNOSIS — F988 Other specified behavioral and emotional disorders with onset usually occurring in childhood and adolescence: Secondary | ICD-10-CM

## 2019-11-14 DIAGNOSIS — R7309 Other abnormal glucose: Secondary | ICD-10-CM

## 2019-11-14 DIAGNOSIS — E349 Endocrine disorder, unspecified: Secondary | ICD-10-CM | POA: Diagnosis not present

## 2019-11-14 DIAGNOSIS — Z79899 Other long term (current) drug therapy: Secondary | ICD-10-CM

## 2019-11-14 MED ORDER — WEGOVY 0.25 MG/0.5ML ~~LOC~~ SOAJ: 0.2500 mg | SUBCUTANEOUS | 1 refills | Status: DC

## 2019-11-14 MED ORDER — AMPHETAMINE-DEXTROAMPHETAMINE 20 MG PO TABS: ORAL_TABLET | ORAL | 0 refills | Status: DC

## 2019-11-15 LAB — COMPLETE METABOLIC PANEL WITH GFR
AG Ratio: 1.9 (calc) (ref 1.0–2.5)
ALT: 77 U/L — ABNORMAL HIGH (ref 9–46)
AST: 48 U/L — ABNORMAL HIGH (ref 10–40)
Albumin: 5 g/dL (ref 3.6–5.1)
Alkaline phosphatase (APISO): 89 U/L (ref 36–130)
BUN: 11 mg/dL (ref 7–25)
CO2: 31 mmol/L (ref 20–32)
Calcium: 10.3 mg/dL (ref 8.6–10.3)
Chloride: 102 mmol/L (ref 98–110)
Creat: 0.91 mg/dL (ref 0.60–1.35)
GFR, Est African American: 124 mL/min/{1.73_m2} (ref >=60)
GFR, Est Non African American: 107 mL/min/{1.73_m2} (ref >=60)
Globulin: 2.7 g/dL (calc) (ref 1.9–3.7)
Glucose, Bld: 153 mg/dL — ABNORMAL HIGH (ref 65–99)
Potassium: 4 mmol/L (ref 3.5–5.3)
Sodium: 140 mmol/L (ref 135–146)
Total Bilirubin: 0.7 mg/dL (ref 0.2–1.2)
Total Protein: 7.7 g/dL (ref 6.1–8.1)

## 2019-11-15 LAB — CBC WITH DIFFERENTIAL/PLATELET
Absolute Monocytes: 473 cells/uL (ref 200–950)
Basophils Absolute: 63 cells/uL (ref 0–200)
Basophils Relative: 1 %
Eosinophils Absolute: 428 cells/uL (ref 15–500)
Eosinophils Relative: 6.8 %
HCT: 43.4 % (ref 38.5–50.0)
Hemoglobin: 14.6 g/dL (ref 13.2–17.1)
Lymphs Abs: 2312 cells/uL (ref 850–3900)
MCH: 29.9 pg (ref 27.0–33.0)
MCHC: 33.6 g/dL (ref 32.0–36.0)
MCV: 88.9 fL (ref 80.0–100.0)
MPV: 10.6 fL (ref 7.5–12.5)
Monocytes Relative: 7.5 %
Neutro Abs: 3024 cells/uL (ref 1500–7800)
Neutrophils Relative %: 48 %
Platelets: 310 10*3/uL (ref 140–400)
RBC: 4.88 10*6/uL (ref 4.20–5.80)
RDW: 11.8 % (ref 11.0–15.0)
Total Lymphocyte: 36.7 %
WBC: 6.3 10*3/uL (ref 3.8–10.8)

## 2019-11-15 LAB — HEMOGLOBIN A1C
Hgb A1c MFr Bld: 5.6 % of total Hgb (ref <5.7)
Mean Plasma Glucose: 114 (calc)
eAG (mmol/L): 6.3 (calc)

## 2019-11-15 LAB — LIPID PANEL
Cholesterol: 181 mg/dL (ref <200)
HDL: 38 mg/dL — ABNORMAL LOW (ref >=40)
LDL Cholesterol (Calc): 110 mg/dL (calc) — ABNORMAL HIGH
Non-HDL Cholesterol (Calc): 143 mg/dL (calc) — ABNORMAL HIGH (ref <130)
Total CHOL/HDL Ratio: 4.8 (calc) (ref <5.0)
Triglycerides: 214 mg/dL — ABNORMAL HIGH (ref <150)

## 2019-11-15 LAB — TESTOSTERONE: Testosterone: 232 ng/dL — ABNORMAL LOW (ref 250–827)

## 2019-11-15 LAB — TEST AUTHORIZATION

## 2019-11-15 LAB — TSH: TSH: 1.57 mIU/L (ref 0.40–4.50)

## 2019-11-15 LAB — INSULIN, RANDOM: Insulin: 219.2 u[IU]/mL — ABNORMAL HIGH

## 2019-11-15 LAB — MAGNESIUM: Magnesium: 2.2 mg/dL (ref 1.5–2.5)

## 2019-11-15 LAB — VITAMIN D 25 HYDROXY (VIT D DEFICIENCY, FRACTURES): Vit D, 25-Hydroxy: 79 ng/mL (ref 30–100)

## 2019-11-15 NOTE — Progress Notes (Signed)
============================================================  -    A1c = 5.6% is Normal, but..................  - Random glucose = 153   (Normal is less than 100 unless have eaten in the preceding 2 hours  And  /  But............................  - Insulin = 219.2  (Normal is less than 20)   - and elevated Insulin is a sign of  Insulin Resistance - a sign of early diabetes and associated with a 300 % greater risk for heart attacks, strokes, cancer & Alzheimer type vascular dementia - All this can be cured  and prevented with losing weight - get Dr Francis Dowse Fuhrman's book 'the End of Diabetes" and "the End of Dieting" - and add many years of good health to your life.  - So - all the more important to lose weight to "cure" the Insulin Resistance ============================================================  -  Vitamin D = 79 - Excellent  ============================================================  -  Total Chol = 181 - Great, But.....................  -  The Bad / Dangerous LDL Chol = 110  - too high (Ideal or goal is less than 70 !  )   - So.....................   - Be sure taking your Atorvastatin every day   - and   - Cholesterol is too high - Recommend low cholesterol diet   - Cholesterol only comes from animal sources  - ie. meat, dairy, egg yolks  - Eat all the vegetables you want.  - Avoid meat, especially red meat - Beef AND Pork .  - Avoid cheese & dairy - milk & ice cream.     - Cheese is the most concentrated form of trans-fats which  is the worst thing to clog up our arteries.   - Veggie cheese is OK which can be found in the fresh  produce section at [employer] or [employer] or [employer] ===========================================================  - All Else - CBC - Kidneys - Electrolytes - Liver - Magnesium & Thyroid    - all  Normal / OK ===========================================================

## 2019-11-16 IMAGING — CR DG CHEST 2V
2 series · 2 of 2 positions shown · non-contrast
Comparison: None.

CLINICAL DATA: Persistent cough for several months. Recent
pneumonia.

EXAM:
CHEST - 2 VIEW

[chest pa]
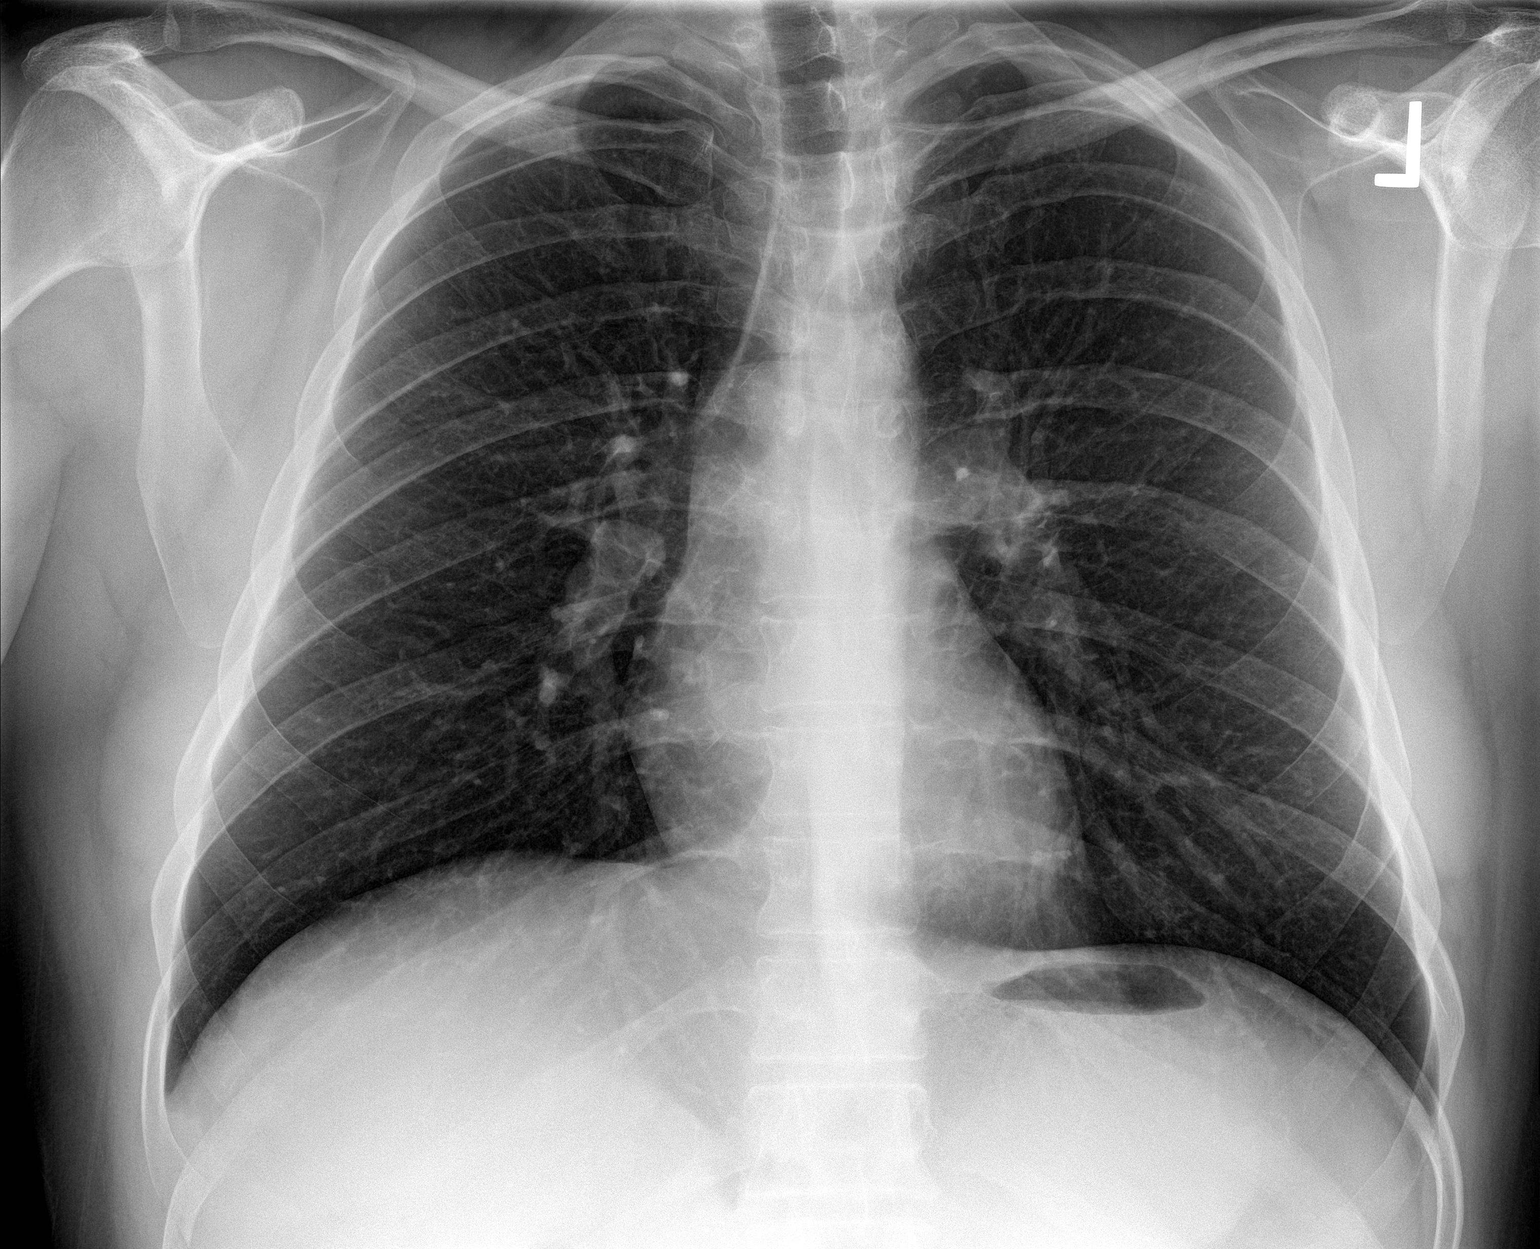

[chest lat]
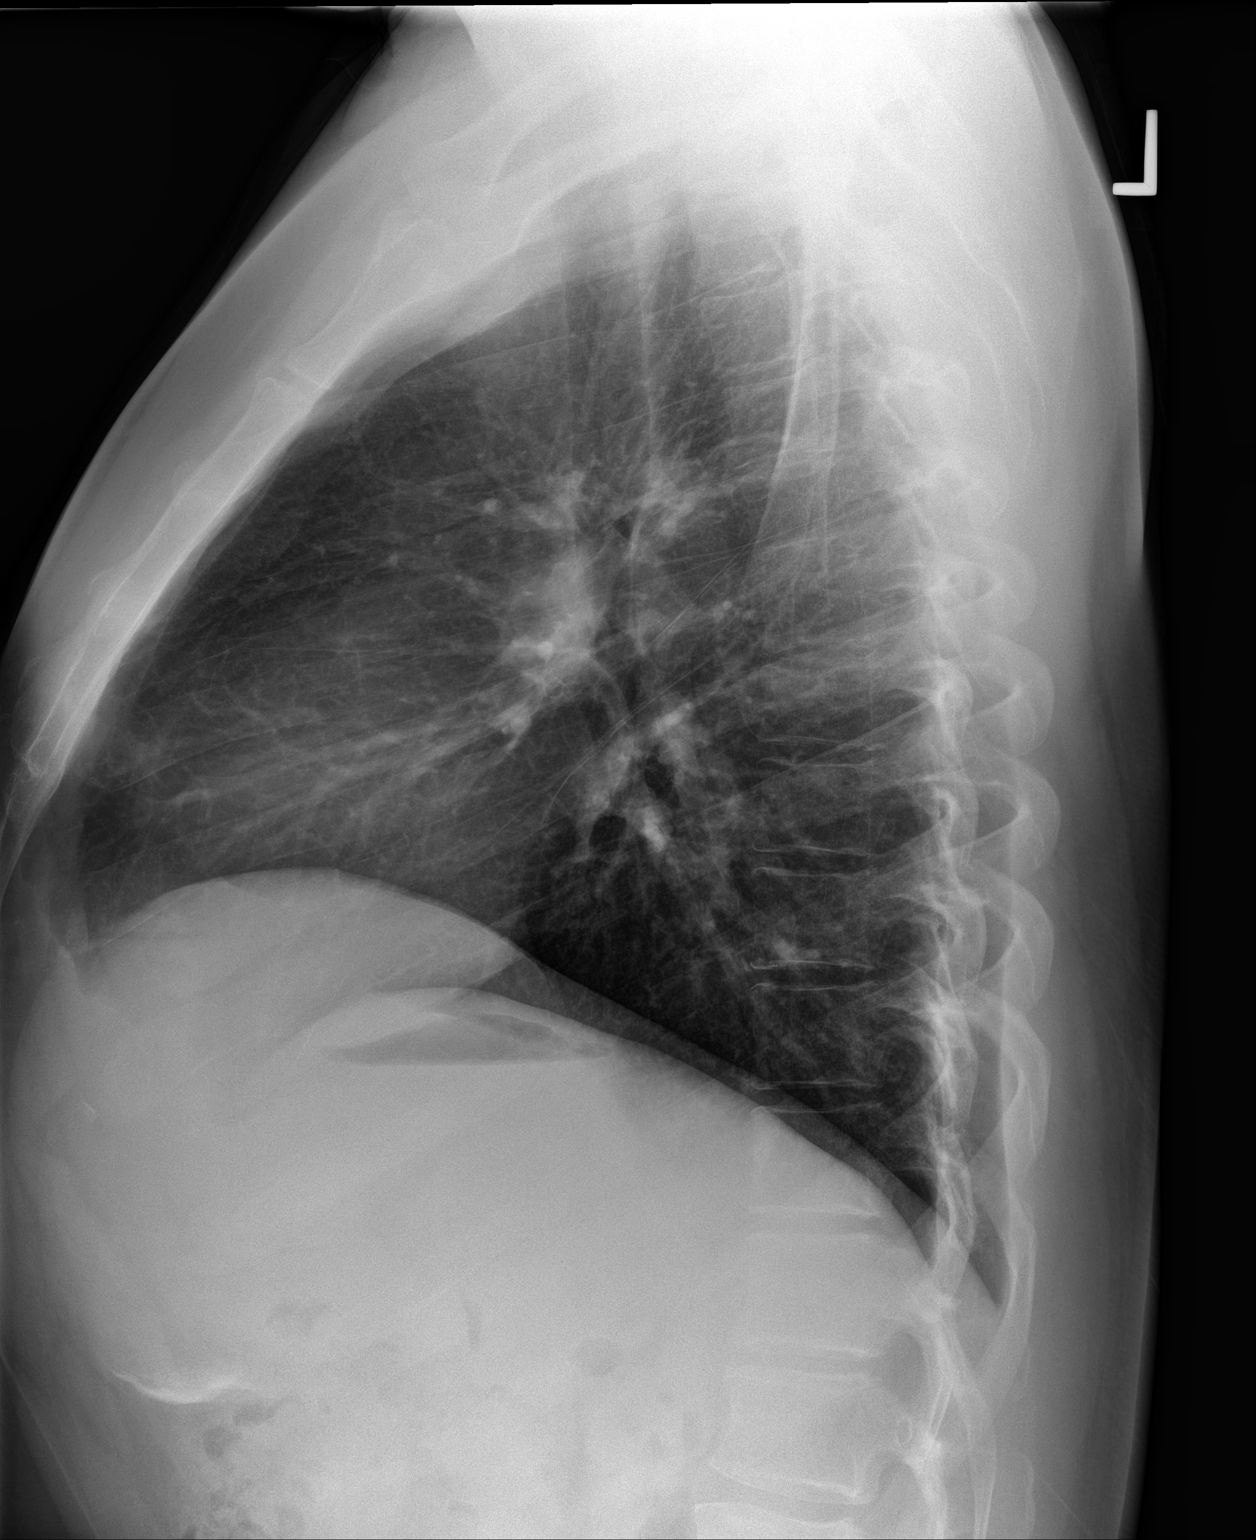

[2 of 2 positions shown; findings below may reference images not displayed]

FINDINGS: The heart size and mediastinal contours are within normal limits.
Both lungs are clear. The visualized skeletal structures are
unremarkable.
IMPRESSION: Negative.  No active cardiopulmonary disease.

## 2019-11-16 NOTE — Progress Notes (Signed)
===========================================================  -   Testosterone level returned Low at 222  (Nl or ideal range is between 450-850)   - Taking Zinc 50 mg tablet Daily may help raise Testosterone level naturally as well as losing weight  & Exercising will raise Testosterone lrvel

## 2019-12-16 ENCOUNTER — Other Ambulatory Visit: Payer: Self-pay | Admitting: Internal Medicine

## 2019-12-16 DIAGNOSIS — F988 Other specified behavioral and emotional disorders with onset usually occurring in childhood and adolescence: Secondary | ICD-10-CM

## 2019-12-16 MED ORDER — AMPHETAMINE-DEXTROAMPHETAMINE 20 MG PO TABS: ORAL_TABLET | ORAL | 0 refills | Status: DC

## 2020-01-15 ENCOUNTER — Other Ambulatory Visit: Payer: Self-pay

## 2020-01-15 DIAGNOSIS — F988 Other specified behavioral and emotional disorders with onset usually occurring in childhood and adolescence: Secondary | ICD-10-CM

## 2020-01-15 MED ORDER — AMPHETAMINE-DEXTROAMPHETAMINE 20 MG PO TABS: ORAL_TABLET | ORAL | 0 refills | Status: DC

## 2020-02-14 ENCOUNTER — Ambulatory Visit (INDEPENDENT_AMBULATORY_CARE_PROVIDER_SITE_OTHER): Payer: BC Managed Care – PPO | Admitting: Adult Health

## 2020-02-14 ENCOUNTER — Encounter: Payer: Self-pay | Admitting: Adult Health

## 2020-02-14 ENCOUNTER — Other Ambulatory Visit: Payer: Self-pay

## 2020-02-14 VITALS — BP 112/86 | HR 78 | Temp 97.5°F | Wt 237.0 lb

## 2020-02-14 DIAGNOSIS — E559 Vitamin D deficiency, unspecified: Secondary | ICD-10-CM

## 2020-02-14 DIAGNOSIS — F324 Major depressive disorder, single episode, in partial remission: Secondary | ICD-10-CM | POA: Diagnosis not present

## 2020-02-14 DIAGNOSIS — Z23 Encounter for immunization: Secondary | ICD-10-CM

## 2020-02-14 DIAGNOSIS — E782 Mixed hyperlipidemia: Secondary | ICD-10-CM

## 2020-02-14 DIAGNOSIS — E669 Obesity, unspecified: Secondary | ICD-10-CM

## 2020-02-14 DIAGNOSIS — F988 Other specified behavioral and emotional disorders with onset usually occurring in childhood and adolescence: Secondary | ICD-10-CM

## 2020-02-14 DIAGNOSIS — R0989 Other specified symptoms and signs involving the circulatory and respiratory systems: Secondary | ICD-10-CM | POA: Diagnosis not present

## 2020-02-14 DIAGNOSIS — E349 Endocrine disorder, unspecified: Secondary | ICD-10-CM

## 2020-02-14 MED ORDER — DICLOFENAC SODIUM 1 % EX GEL: 2.0000 g | Freq: Four times a day (QID) | CUTANEOUS | 0 refills | Status: DC | PRN

## 2020-02-14 MED ORDER — AMPHETAMINE-DEXTROAMPHETAMINE 20 MG PO TABS: ORAL_TABLET | ORAL | 0 refills | Status: DC

## 2020-02-14 NOTE — Progress Notes (Signed)
3 Month Follow Up   Assessment and Plan:   Cole Lozano was seen today for follow-up.  Diagnoses and all orders for this visit:  Labile hypertension Controlled today Monitor blood pressure at home; call if consistently  Continue DASH diet.   Reminder to go to the ER if any CP, SOB, nausea, dizziness, severe HA, changes vision/speech, left arm numbness and tingling and jaw pain.  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 The patient was counseled on the addictive nature of the medication and was encouraged to take drug holidays when not needed.   Hyperlipidemia, mixed Discussed dietary and exercise modifications Continue Lipitor Will check lipids   Depression, controlled Continue current regiment with benefit Wellbutrin and celexa  Finger pain, right Follow up with ortho; suggested voltaren; soft brace at work [employer] and rest when able  Vitamin D deficiency Continue supplementation  Testosterone deficiency On natural supplement since last visit; requests recheck Suggested zinc, lifestyle discussed, HIIT and fat loss encouraged Prefers to avoid testosterone supplement if possible    Continue diet and meds as discussed. Further disposition pending results of labs. Discussed med's effects and SE's.   Over 30 minutes of exam, counseling, chart review, and critical decision making was performed.   Future Appointments  Date Time Provider Department Center  05/15/2020 10:00 AM Lucky Cowboy, MD [facility] None    ----------------------------------------------------------------------------------------------------------------------  HPI 39 y.o. male  presents for 3 month follow up on HTN, HLD, Hypothyroidism, history of pre-diabetes, weight and vitamin D deficiency.   He has hx of R thumb fracture with hardware over 10 years, having more pain, probably needs surgery but postponing. Using NSAID PRN.   Doing well  tolerating the ADDERALL 20MG .  Takes 1 tab BID PRN on days he works. At least once a week, denies concerning SE.   Wellbutrin and celexa has been working well and he is managing stress at work.  Reports he works long hours and has a high demanding job.  Reports his wife has noticed a difference since he started taking this.   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 Was prescribed wegovy but never started, mostly needs to stop eating fast food at 3AM. Confident he can do this without medication, plans to restart logging. Wants to get <200 lb.  Wt Readings from Last 3 Encounters:  02/14/20 237 lb (107.5 kg)  11/14/19 (!) 237 lb 6.4 oz (107.7 kg)  05/09/19 232 lb 3.2 oz (105.3 kg)   His blood pressure has been controlled at home, today their BP is BP: 112/86  He does not workout. Physically active job. He denies any cardiac symptoms, chest pains, palpitations, shortness of breath, dizziness.   He is on cholesterol medication Atorvastatin 80 mg (taking 4 days a week) and denies myalgias. His cholesterol is not at goal. The cholesterol last visit was:   Lab Results  Component Value Date   CHOL 181 11/14/2019   HDL 38 (L) 11/14/2019   LDLCALC 110 (H) 11/14/2019   TRIG 214 (H) 11/14/2019   CHOLHDL 4.8 11/14/2019    He has not been working on diet and exercise for prediabetes, and denies foot ulcerations, hyperglycemia, hypoglycemia , nausea, paresthesia of the feet, polydipsia and polyuria. Last A1C in the office was:  Lab Results  Component Value Date   HGBA1C 5.6 11/14/2019  Patient is on Vitamin D supplement.   Lab Results  Component Value Date   VD25OH 41 11/14/2019     He has a history of testosterone deficiency, has noted low motivation, low energy, has been on tribulus natural supplement for thissince last visit, requests recheck today. Admits not on zinc, planning to  restart.  Lab Results  Component Value Date   TESTOSTERONE 232 (L) 11/14/2019    Current Medications:  Current Outpatient Medications on File Prior to Visit  Medication Sig   albuterol (PROVENTIL HFA;VENTOLIN HFA) 108 (90 Base) MCG/ACT inhaler Inhale 2 puffs into the lungs every 6 (six) hours as needed for wheezing or shortness of breath.   aspirin 81 MG tablet Take 81 mg by mouth daily.   atorvastatin (LIPITOR) 80 MG tablet Take 1 tablet Daily for Cholesterol   buPROPion (WELLBUTRIN XL) 300 MG 24 hr tablet Take 1 tablet every Morning for Focus & Concentration   Cholecalciferol (VITAMIN D PO) Take 10,000 Units by mouth daily.    citalopram (CELEXA) 40 MG tablet TAKE 1 TABLET BY MOUTH EVERY DAY   minoxidil (ROGAINE) 2 % external solution Apply topically 2 (two) times daily.   montelukast (SINGULAIR) 10 MG tablet TAKE 1 TABLET(10 MG) DAILY FOR ALLERGIES.   multivitamin (ONE-A-DAY MEN'S) TABS tablet Take 1 tablet by mouth daily.   OVER THE COUNTER MEDICATION Allergra tab twice daily   OVER THE COUNTER MEDICATION Takes OTC Zyrtec daily   OVER THE COUNTER MEDICATION Tribulus, herbal supplement for increasing testosterone, 2 capsules daily.   topiramate (TOPAMAX) 50 MG tablet Take 1/2 to 1 tablet 2 x /day at Suppertime & Bedtime for Dieting & Weight Loss   amphetamine-dextroamphetamine (ADDERALL) 20 MG tablet 1/2 to 1 tablet 1 or 2 x /daily for ADD, should not take daily to avoid tolerance / addiction - script should last 30 days   Semaglutide-Weight Management (WEGOVY) 0.25 MG/0.5ML SOAJ Inject 0.25 mg into the skin every 7 (seven) days. (Patient not taking: Reported on 02/14/2020)   No current facility-administered medications on file prior to visit.    Allergies:  No Known Allergies   Medical History:  Past Medical History:  Diagnosis Date   ADD (attention deficit disorder)    Allergy    Anxiety    Asthma    Depression    Hyperlipidemia    Hypertension     Insulin resistance    history of normal A1C but insulin 60, 46   Vitamin D deficiency     Family history- Reviewed and unchanged   Social history- Reviewed and unchanged   Review of Systems:  Review of Systems  Constitutional: Negative for malaise/fatigue and weight loss.  HENT: Negative for hearing loss and tinnitus.   Eyes: Negative for blurred vision and double vision.  Respiratory: Negative for cough, shortness of breath and wheezing.   Cardiovascular: Negative for chest pain, palpitations, orthopnea, claudication and leg swelling.  Gastrointestinal: Negative for abdominal pain, blood in stool, constipation, diarrhea, heartburn, melena, nausea and vomiting.  Genitourinary: Negative.   Musculoskeletal: Negative for joint pain and myalgias.  Skin: Negative for rash.  Neurological: Negative for dizziness, tingling, sensory change, weakness and headaches.  Endo/Heme/Allergies: Negative for polydipsia.  Psychiatric/Behavioral: Negative.   All other systems reviewed and are negative.     Physical Exam: BP 112/86    Pulse 78    Temp (!) 97.5 F (36.4 C)    Wt 237 lb (107.5 kg)    SpO2 96%    BMI  34.01 kg/m  Wt Readings from Last 3 Encounters:  02/14/20 237 lb (107.5 kg)  11/14/19 (!) 237 lb 6.4 oz (107.7 kg)  05/09/19 232 lb 3.2 oz (105.3 kg)   General Appearance: Well nourished, in no apparent distress. Eyes: PERRLA, EOMs, conjunctiva no swelling or erythema Sinuses: No Frontal/maxillary tenderness ENT/Mouth: Ext aud canals clear, TMs without erythema, bulging. No erythema, swelling, or exudate on post pharynx.  Tonsils not swollen or erythematous. Hearing normal.  Neck: Supple, thyroid normal.  Respiratory: Respiratory effort normal, BS equal bilaterally without rales, rhonchi, wheezing or stridor.  Cardio: RRR with no MRGs. Brisk peripheral pulses without edema.  Abdomen: Soft, + BS.  Non tender, no guarding, rebound, hernias, masses. Lymphatics: Non tender without  lymphadenopathy.  Musculoskeletal: Full ROM, 5/5 strength, normal gait.  Right hand first finger PIP joint mild enlargement.  tender with deep palpation and extension for prolonged period.   Skin: Warm, dry without rashes, lesions, ecchymosis.  Neuro: Cranial nerves intact. No cerebellar symptoms.  Psych: Awake and oriented X 3, normal affect, Insight and Judgment appropriate.    Dan Maker, NP [facility] 10:25 AM

## 2020-02-14 NOTE — Addendum Note (Signed)
Addended by: Dionicio Stall on: 02/14/2020 11:17 AM   Modules accepted: Orders

## 2020-02-14 NOTE — Patient Instructions (Addendum)
Goals    . Weight (lb) < 200 lb (90.7 kg)        Try voltaren for thumb - 3-4 times a day PRN   Try tracking intake, exercise - [employer] is free    High-Fiber Diet Fiber, also called dietary fiber, is a type of carbohydrate that is found in fruits, vegetables, whole grains, and beans. A high-fiber diet can have many health benefits. Your health care provider may recommend a high-fiber diet to help:  Prevent constipation. Fiber can make your bowel movements more regular.  Lower your cholesterol.  Relieve the following conditions: ? Swelling of veins in the anus (hemorrhoids). ? Swelling and irritation (inflammation) of specific areas of the digestive tract (uncomplicated diverticulosis). ? A problem of the large intestine (colon) that sometimes causes pain and diarrhea (irritable bowel syndrome, IBS).  Prevent overeating as part of a weight-loss plan.  Prevent heart disease, type 2 diabetes, and certain cancers. What is my plan? The recommended daily fiber intake in grams (g) includes:  38 g for men age 43 or younger.  30 g for men over age 8.  25 g for women age 55 or younger.  21 g for women over age 68. You can get the recommended daily intake of dietary fiber by:  Eating a variety of fruits, vegetables, grains, and beans.  Taking a fiber supplement, if it is not possible to get enough fiber through your diet. What do I need to know about a high-fiber diet?  It is better to get fiber through food sources rather than from fiber supplements. There is not a lot of research about how effective supplements are.  Always check the fiber content on the nutrition facts label of any prepackaged food. Look for foods that contain 5 g of fiber or more per serving.  Talk with a diet and nutrition specialist (dietitian) if you have questions about specific foods that are recommended or not recommended for your medical condition, especially if those foods are not listed  below.  Gradually increase how much fiber you consume. If you increase your intake of dietary fiber too quickly, you may have bloating, cramping, or gas.  Drink plenty of water. Water helps you to digest fiber. What are tips for following this plan?  Eat a wide variety of high-fiber foods.  Make sure that half of the grains that you eat each day are whole grains.  Eat breads and cereals that are made with whole-grain flour instead of refined flour or white flour.  Eat brown rice, bulgur wheat, or millet instead of white rice.  Start the day with a breakfast that is high in fiber, such as a cereal that contains 5 g of fiber or more per serving.  Use beans in place of meat in soups, salads, and pasta dishes.  Eat high-fiber snacks, such as berries, raw vegetables, nuts, and popcorn.  Choose whole fruits and vegetables instead of processed forms like juice or sauce. What foods can I eat?  Fruits Berries. Pears. Apples. Oranges. Avocado. Prunes and raisins. Dried figs. Vegetables Sweet potatoes. Spinach. Kale. Artichokes. Cabbage. Broccoli. Cauliflower. Green peas. Carrots. Squash. Grains Whole-grain breads. Multigrain cereal. Oats and oatmeal. Brown rice. Barley. Bulgur wheat. Millet. Quinoa. Bran muffins. Popcorn. Rye wafer crackers. Meats and other proteins Navy, kidney, and pinto beans. Soybeans. Split peas. Lentils. Nuts and seeds. Dairy Fiber-fortified yogurt. Beverages Fiber-fortified soy milk. Fiber-fortified orange juice. Other foods Fiber bars. The items listed above may not be a complete list  of recommended foods and beverages. Contact a dietitian for more options. What foods are not recommended? Fruits Fruit juice. Cooked, strained fruit. Vegetables Fried potatoes. Canned vegetables. Well-cooked vegetables. Grains White bread. Pasta made with refined flour. White rice. Meats and other proteins Fatty cuts of meat. Fried chicken or fried fish. Dairy Milk.  Yogurt. Cream cheese. Sour cream. Fats and oils Butters. Beverages Soft drinks. Other foods Cakes and pastries. The items listed above may not be a complete list of foods and beverages to avoid. Contact a dietitian for more information. Summary  Fiber is a type of carbohydrate. It is found in fruits, vegetables, whole grains, and beans.  There are many health benefits of eating a high-fiber diet, such as preventing constipation, lowering blood cholesterol, helping with weight loss, and reducing your risk of heart disease, diabetes, and certain cancers.  Gradually increase your intake of fiber. Increasing too fast can result in cramping, bloating, and gas. Drink plenty of water while you increase your fiber.  The best sources of fiber include whole fruits and vegetables, whole grains, nuts, seeds, and beans. This information is not intended to replace advice given to you by your health care provider. Make sure you discuss any questions you have with your health care provider. Document Revised: 02/13/2017 Document Reviewed: 02/13/2017 Elsevier Patient Education  2020 [employer].

## 2020-02-15 ENCOUNTER — Encounter: Payer: Self-pay | Admitting: Adult Health

## 2020-02-15 ENCOUNTER — Other Ambulatory Visit: Payer: Self-pay | Admitting: Adult Health

## 2020-02-15 DIAGNOSIS — R7989 Other specified abnormal findings of blood chemistry: Secondary | ICD-10-CM

## 2020-02-15 LAB — COMPLETE METABOLIC PANEL WITH GFR
AG Ratio: 2.1 (calc) (ref 1.0–2.5)
ALT: 57 U/L — ABNORMAL HIGH (ref 9–46)
AST: 34 U/L (ref 10–40)
Albumin: 4.8 g/dL (ref 3.6–5.1)
Alkaline phosphatase (APISO): 84 U/L (ref 36–130)
BUN: 14 mg/dL (ref 7–25)
CO2: 27 mmol/L (ref 20–32)
Calcium: 9.9 mg/dL (ref 8.6–10.3)
Chloride: 102 mmol/L (ref 98–110)
Creat: 0.92 mg/dL (ref 0.60–1.35)
GFR, Est African American: 123 mL/min/{1.73_m2} (ref >=60)
GFR, Est Non African American: 106 mL/min/{1.73_m2} (ref >=60)
Globulin: 2.3 g/dL (calc) (ref 1.9–3.7)
Glucose, Bld: 100 mg/dL — ABNORMAL HIGH (ref 65–99)
Potassium: 4.8 mmol/L (ref 3.5–5.3)
Sodium: 140 mmol/L (ref 135–146)
Total Bilirubin: 0.4 mg/dL (ref 0.2–1.2)
Total Protein: 7.1 g/dL (ref 6.1–8.1)

## 2020-02-15 LAB — CBC WITH DIFFERENTIAL/PLATELET
Absolute Monocytes: 586 cells/uL (ref 200–950)
Basophils Absolute: 63 cells/uL (ref 0–200)
Basophils Relative: 1 %
Eosinophils Absolute: 592 cells/uL — ABNORMAL HIGH (ref 15–500)
Eosinophils Relative: 9.4 %
HCT: 40.2 % (ref 38.5–50.0)
Hemoglobin: 13.7 g/dL (ref 13.2–17.1)
Lymphs Abs: 2180 cells/uL (ref 850–3900)
MCH: 29.8 pg (ref 27.0–33.0)
MCHC: 34.1 g/dL (ref 32.0–36.0)
MCV: 87.6 fL (ref 80.0–100.0)
MPV: 10.5 fL (ref 7.5–12.5)
Monocytes Relative: 9.3 %
Neutro Abs: 2879 cells/uL (ref 1500–7800)
Neutrophils Relative %: 45.7 %
Platelets: 302 10*3/uL (ref 140–400)
RBC: 4.59 10*6/uL (ref 4.20–5.80)
RDW: 12.2 % (ref 11.0–15.0)
Total Lymphocyte: 34.6 %
WBC: 6.3 10*3/uL (ref 3.8–10.8)

## 2020-02-15 LAB — TSH: TSH: 1.64 mIU/L (ref 0.40–4.50)

## 2020-02-15 LAB — TESTOSTERONE: Testosterone: 227 ng/dL — ABNORMAL LOW (ref 250–827)

## 2020-02-15 LAB — LIPID PANEL
Cholesterol: 160 mg/dL (ref <200)
HDL: 39 mg/dL — ABNORMAL LOW (ref >=40)
LDL Cholesterol (Calc): 96 mg/dL (calc)
Non-HDL Cholesterol (Calc): 121 mg/dL (calc) (ref <130)
Total CHOL/HDL Ratio: 4.1 (calc) (ref <5.0)
Triglycerides: 146 mg/dL (ref <150)

## 2020-03-13 ENCOUNTER — Ambulatory Visit
Admission: RE | Admit: 2020-03-13 | Discharge: 2020-03-13 | Disposition: A | Discharge status: Home / Self Care | Payer: BC Managed Care – PPO | Source: Ambulatory Visit | Attending: Adult Health | Admitting: Adult Health

## 2020-03-13 DIAGNOSIS — R7989 Other specified abnormal findings of blood chemistry: Secondary | ICD-10-CM

## 2020-03-13 DIAGNOSIS — K7689 Other specified diseases of liver: Secondary | ICD-10-CM | POA: Diagnosis not present

## 2020-03-16 ENCOUNTER — Encounter: Payer: Self-pay | Admitting: Adult Health

## 2020-03-16 DIAGNOSIS — K760 Fatty (change of) liver, not elsewhere classified: Secondary | ICD-10-CM | POA: Insufficient documentation

## 2020-03-17 ENCOUNTER — Other Ambulatory Visit: Payer: Self-pay

## 2020-03-17 DIAGNOSIS — F988 Other specified behavioral and emotional disorders with onset usually occurring in childhood and adolescence: Secondary | ICD-10-CM

## 2020-03-17 MED ORDER — AMPHETAMINE-DEXTROAMPHETAMINE 20 MG PO TABS: ORAL_TABLET | ORAL | 0 refills | Status: DC

## 2020-04-06 ENCOUNTER — Encounter: Payer: BC Managed Care – PPO | Admitting: Internal Medicine

## 2020-04-14 ENCOUNTER — Other Ambulatory Visit: Payer: Self-pay

## 2020-04-14 DIAGNOSIS — F988 Other specified behavioral and emotional disorders with onset usually occurring in childhood and adolescence: Secondary | ICD-10-CM

## 2020-04-14 MED ORDER — AMPHETAMINE-DEXTROAMPHETAMINE 20 MG PO TABS: ORAL_TABLET | ORAL | 0 refills | Status: DC

## 2020-05-14 ENCOUNTER — Other Ambulatory Visit: Payer: Self-pay

## 2020-05-14 DIAGNOSIS — F988 Other specified behavioral and emotional disorders with onset usually occurring in childhood and adolescence: Secondary | ICD-10-CM

## 2020-05-14 MED ORDER — AMPHETAMINE-DEXTROAMPHETAMINE 20 MG PO TABS: ORAL_TABLET | ORAL | 0 refills | Status: DC

## 2020-05-15 ENCOUNTER — Encounter: Payer: BC Managed Care – PPO | Admitting: Internal Medicine

## 2020-06-12 ENCOUNTER — Other Ambulatory Visit: Payer: Self-pay

## 2020-06-12 ENCOUNTER — Other Ambulatory Visit: Payer: Self-pay | Admitting: Internal Medicine

## 2020-06-12 DIAGNOSIS — F988 Other specified behavioral and emotional disorders with onset usually occurring in childhood and adolescence: Secondary | ICD-10-CM

## 2020-06-12 MED ORDER — AMPHETAMINE-DEXTROAMPHETAMINE 20 MG PO TABS: ORAL_TABLET | ORAL | 0 refills | Status: DC

## 2020-07-09 ENCOUNTER — Encounter: Payer: Self-pay | Admitting: Internal Medicine

## 2020-07-09 NOTE — Progress Notes (Signed)
R  E  S  C  H  E  D  U L  E  D  Sick    child                                                                                                                                                                                                                                                                                                [name]   presents for a Screening /Preventative Visit & comprehensive evaluation and management of multiple medical co-morbidities.  Patient has been followed for HTN, HLD, Prediabetes and Vitamin D Deficiency.  Patient is on Citalopram  for anxiety and also is on Wellbutrin /Adderall for ADD   with improved for his focus & concentration.      Patient has been followed expectantly for labile HTN  since 2016. Patient's BP has been controlled at home.  Today's  . Patient denies any cardiac symptoms as chest pain, palpitations, shortness of breath, dizziness or ankle swelling.      [name] is controlled with diet and Atorvastatin. Patient denies myalgias or other medication SE's. Last lipids were at goal:  Lab Results  Component Value Date   CHOL 160 02/14/2020   HDL 39 (L) 02/14/2020   LDLCALC 96 02/14/2020   TRIG 146 02/14/2020   CHOLHDL 4.1 02/14/2020        He has a history of testosterone deficiency ("232" July 2021 and "227" Oct 2021) , has noted low motivation, low energy, has been on Tribulus - a  natural supplement for this since last visit. Admits not on zinc, planning to restart.       Patient has Moderate Obesity  (BMI 34+) and consequent  prediabetes with Insulin Resistance  (A1c 5.2%/Insulin 94/2014 and A1c 5.6% /Insulin 46/2017)   and patient denies reactive hypoglycemic symptoms, visual blurring, diabetic polys or paresthesias. Last A1c was normal & at goal:   Lab Results  Component Value Date   HGBA1C 5.6 11/14/2019         Finally, patient has  history of Vitamin D Deficiency of    and last vitamin D was at goal:  Lab Results  Component Value Date   VD25OH 10 11/14/2019   Social History   Socioeconomic History  . Marital status: Married    Spouse name: Luther Parody  . Number of children: 1 son 73 yo  Occupational History  . Works in Consulting civil engineer setting up networks

## 2020-07-10 ENCOUNTER — Ambulatory Visit: Payer: BC Managed Care – PPO | Admitting: Internal Medicine

## 2020-07-10 DIAGNOSIS — Z1211 Encounter for screening for malignant neoplasm of colon: Secondary | ICD-10-CM

## 2020-07-10 DIAGNOSIS — R0989 Other specified symptoms and signs involving the circulatory and respiratory systems: Secondary | ICD-10-CM

## 2020-07-10 DIAGNOSIS — E349 Endocrine disorder, unspecified: Secondary | ICD-10-CM

## 2020-07-10 DIAGNOSIS — Z0001 Encounter for general adult medical examination with abnormal findings: Secondary | ICD-10-CM

## 2020-07-10 DIAGNOSIS — R5383 Other fatigue: Secondary | ICD-10-CM

## 2020-07-10 DIAGNOSIS — E782 Mixed hyperlipidemia: Secondary | ICD-10-CM

## 2020-07-10 DIAGNOSIS — E663 Overweight: Secondary | ICD-10-CM | POA: Insufficient documentation

## 2020-07-10 DIAGNOSIS — E559 Vitamin D deficiency, unspecified: Secondary | ICD-10-CM

## 2020-07-10 DIAGNOSIS — Z136 Encounter for screening for cardiovascular disorders: Secondary | ICD-10-CM

## 2020-07-10 DIAGNOSIS — Z8249 Family history of ischemic heart disease and other diseases of the circulatory system: Secondary | ICD-10-CM

## 2020-07-10 DIAGNOSIS — E6609 Other obesity due to excess calories: Secondary | ICD-10-CM | POA: Insufficient documentation

## 2020-07-10 DIAGNOSIS — Z111 Encounter for screening for respiratory tuberculosis: Secondary | ICD-10-CM

## 2020-07-10 DIAGNOSIS — F988 Other specified behavioral and emotional disorders with onset usually occurring in childhood and adolescence: Secondary | ICD-10-CM

## 2020-07-10 DIAGNOSIS — Z79899 Other long term (current) drug therapy: Secondary | ICD-10-CM

## 2020-07-10 DIAGNOSIS — E8881 Metabolic syndrome: Secondary | ICD-10-CM

## 2020-07-11 ENCOUNTER — Other Ambulatory Visit: Payer: Self-pay | Admitting: Internal Medicine

## 2020-07-11 MED ORDER — MONTELUKAST SODIUM 10 MG PO TABS: ORAL_TABLET | ORAL | 3 refills | Status: DC

## 2020-07-13 ENCOUNTER — Other Ambulatory Visit: Payer: Self-pay

## 2020-07-13 DIAGNOSIS — F988 Other specified behavioral and emotional disorders with onset usually occurring in childhood and adolescence: Secondary | ICD-10-CM

## 2020-07-13 MED ORDER — AMPHETAMINE-DEXTROAMPHETAMINE 20 MG PO TABS: ORAL_TABLET | ORAL | 0 refills | Status: DC

## 2020-08-07 DIAGNOSIS — S61402A Unspecified open wound of left hand, initial encounter: Secondary | ICD-10-CM | POA: Diagnosis not present

## 2020-08-10 ENCOUNTER — Other Ambulatory Visit: Payer: Self-pay | Admitting: Internal Medicine

## 2020-08-10 ENCOUNTER — Other Ambulatory Visit: Payer: Self-pay

## 2020-08-10 ENCOUNTER — Other Ambulatory Visit: Payer: Self-pay | Admitting: Adult Health

## 2020-08-10 DIAGNOSIS — E782 Mixed hyperlipidemia: Secondary | ICD-10-CM

## 2020-08-10 DIAGNOSIS — F988 Other specified behavioral and emotional disorders with onset usually occurring in childhood and adolescence: Secondary | ICD-10-CM

## 2020-08-10 MED ORDER — ATORVASTATIN CALCIUM 20 MG PO TABS: ORAL_TABLET | ORAL | 0 refills | Status: DC

## 2020-08-11 ENCOUNTER — Other Ambulatory Visit: Payer: Self-pay

## 2020-08-11 DIAGNOSIS — F988 Other specified behavioral and emotional disorders with onset usually occurring in childhood and adolescence: Secondary | ICD-10-CM

## 2020-08-12 MED ORDER — AMPHETAMINE-DEXTROAMPHETAMINE 20 MG PO TABS: ORAL_TABLET | ORAL | 0 refills | Status: DC

## 2020-09-08 ENCOUNTER — Other Ambulatory Visit: Payer: Self-pay

## 2020-09-08 DIAGNOSIS — F988 Other specified behavioral and emotional disorders with onset usually occurring in childhood and adolescence: Secondary | ICD-10-CM

## 2020-09-09 ENCOUNTER — Other Ambulatory Visit: Payer: Self-pay | Admitting: Internal Medicine

## 2020-09-09 DIAGNOSIS — E782 Mixed hyperlipidemia: Secondary | ICD-10-CM

## 2020-09-09 MED ORDER — ATORVASTATIN CALCIUM 20 MG PO TABS: ORAL_TABLET | ORAL | 3 refills | Status: DC

## 2020-09-10 ENCOUNTER — Encounter: Payer: Self-pay | Admitting: Internal Medicine

## 2020-09-10 ENCOUNTER — Other Ambulatory Visit: Payer: Self-pay

## 2020-09-10 DIAGNOSIS — F988 Other specified behavioral and emotional disorders with onset usually occurring in childhood and adolescence: Secondary | ICD-10-CM

## 2020-09-10 NOTE — Progress Notes (Signed)
Annual  Screening/Preventative Visit  & Comprehensive Evaluation & Examination  Future Appointments  Date Time Provider Department Center  09/11/2020 10:00 AM Lucky Cowboy, MD [facility] None  09/13/2021  3:00 PM Lucky Cowboy, MD [facility] None            [name]   presents for a Screening /Preventative Visit & comprehensive evaluation and management of multiple medical co-morbidities.  Patient has been followed for HTN, HLD, Prediabetes and Vitamin D Deficiency.  Patient is on Citalopram for anxietyand also is on Wellbutrin /AdderallforADD  with improvedforhis focus & concentration.      Patient has been followed expectantly for labile HTN  since 2016. Patient's BP has been controlled at home. Today's BP is at goal - 136/84. Patient denies any cardiac symptoms as chest pain, palpitations, shortness of breath, dizziness or ankle swelling.       [name] is controlled with diet and  Atorvastatin. Patient denies myalgias or other medication SE's. Last lipids were at goal: Lab Results  Component Value Date   CHOL 160 02/14/2020   HDL 39 (L) 02/14/2020   LDLCALC 96 02/14/2020   TRIG 146 02/14/2020   CHOLHDL 4.1 02/14/2020                                          Patient has Moderate Obesity  (BMI 34+) and consequent  prediabetes with Insulin Resistance  (A1c 5.2%/Insulin 94/2014 and A1c 5.6% /Insulin 46/2017) and patient denies reactive hypoglycemic symptoms, visual blurring, diabetic polys or paresthesias. Last A1c was normal & at goal:   Lab Results  Component Value Date   HGBA1C 5.6 11/14/2019                                           He has a history of testosterone deficiency ("232" July 2021 and "227" Oct 2021), has noted low motivation, low energy, has been on Tribulus - a  natural supplement for this since last visit. Admits not on zinc, planning to restart.       Finally, patient has history of Vitamin D Deficiency  and last vitamin D was at goal:    Lab Results  Component Value Date   VD25OH 79 11/14/2019     Current Outpatient Medications on File Prior to Visit  Medication Sig  . albuterol HFA  inhaler Inhale 2 puffs into the lungs every 6 (six) hours as needed for wheezing or shortness of breath.  Marland Kitchen aspirin 81 MG tablet Take 81 mg by mouth daily.  Marland Kitchen atorvastatin  20 MG tablet Take  1 tablet  Daily  for Cholesterol  . VITAMIN D Take 10,000 Units by mouth daily.   . citalopram 40 MG tablet TAKE 1 TABLET BY MOUTH EVERY DAY  . minoxidil  2 % external solution Apply topically 2 (two) times daily.  . montelukast  10 MG tablet Take  1 tablet  Daily  for Allergies  . ONE-A-DAY MEN'S M-Vit  Take 1 tablet by mouth daily.  . ADDERALL 20 MG tablet 1/2 to 1 tablet 1 or 2 x /daily for ADD  . buPROPion-XL 300 MG 24 hr tablet Patient not taking: Reported on 09/11/2020- desires to restart     Past Medical History:  Diagnosis Date  . ADD (attention deficit disorder)   . Allergy   . Anxiety   . Asthma   . Depression   . Hyperlipidemia   . Hypertension   . Insulin resistance    history of normal A1C but insulin 60, 45  . Vitamin D deficiency      Health Maintenance  Topic Date Due  . COVID-19 Vaccine (1) Never done  . INFLUENZA VACCINE  11/23/2020  . TETANUS/TDAP  06/14/2023  . Hepatitis C Screening  Completed  . HIV Screening  Completed  . HPV VACCINES  Aged Out    Immunization History  Administered Date(s) Administered  . Influenza Inj Mdck Quad  02/09/2018, 02/14/2020  . Influenza,inj,Quad PF 02/14/2019  . PPD Test 11/28/2016, 02/09/2018, 05/09/2019  . Tdap 06/13/2013   Last Colon -   Past Surgical History:  Procedure Laterality Date  . ELBOW SURGERY Left 1991   s/p trauma  . HERNIA REPAIR Right 2003   inguinal  . TONSILLECTOMY AND ADENOIDECTOMY       Family History  Problem Relation Age of Onset  . Cancer Mother        skin  . Neurofibromatosis Brother   . Diabetes Other    . Stroke Other     Social History   Socioeconomic History  . Marital status: Married    Spouse name: Luther Parody  . 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 Smoking status: Never Smoker  . Smokeless tobacco: Never Used  Substance and Sexual Activity  . Alcohol use: Yes    Alcohol/week: 1.0 standard drink    Types: 1 drink(s) per week  . Drug use: No  . Sexual activity: Yes    ROS Constitutional: Denies fever, chills, weight loss/gain, headaches, insomnia,  night sweats or change in appetite. Does c/o fatigue. Eyes: Denies redness, blurred vision, diplopia, discharge, itchy or watery eyes.  ENT: Denies discharge, congestion, post nasal drip, epistaxis, sore throat, earache, hearing loss, dental pain, Tinnitus, Vertigo, Sinus pain or snoring.  Cardio: Denies chest pain, palpitations, irregular heartbeat, syncope, dyspnea, diaphoresis, orthopnea, PND, claudication or edema Respiratory: denies cough, dyspnea, DOE, pleurisy, hoarseness, laryngitis or wheezing.  Gastrointestinal: Denies dysphagia, heartburn, reflux, water brash, pain, cramps, nausea, vomiting, bloating, diarrhea, constipation, hematemesis, melena, hematochezia, jaundice or hemorrhoids Genitourinary: Denies dysuria, frequency, urgency, nocturia, hesitancy, discharge, hematuria or flank pain Musculoskeletal: Denies arthralgia, myalgia, stiffness, Jt. Swelling, pain, limp or strain/sprain. Denies Falls. Skin: Denies puritis, rash, hives, warts, acne, eczema or change in skin lesion Neuro: No weakness, tremor, incoordination, spasms, paresthesia or pain Psychiatric: Denies confusion, memory loss or sensory loss. Denies Depression. Endocrine: Denies change in weight, skin, hair change, nocturia, and paresthesia, diabetic polys, visual blurring or hyper / hypo glycemic episodes.  Heme/Lymph: No excessive bleeding, bruising or enlarged lymph nodes.   Physical  Exam  BP 136/84   Pulse 80   Temp 97.7 F (36.5 C)   Resp 16   Ht 5\' 10"  (1.778 m)   Wt 226 lb 6.4 oz (102.7 kg)   SpO2 99%   BMI 32.49 kg/m   General Appearance: Well nourished and well groomed and in no apparent distress.  Eyes: PERRLA, EOMs, conjunctiva no swelling or erythema, normal fundi and vessels. Sinuses: No frontal/maxillary tenderness ENT/Mouth: EACs patent / TMs  nl. Nares clear without erythema, swelling, mucoid exudates. Oral hygiene is good. No erythema, swelling, or exudate. Tongue normal,  non-obstructing. Tonsils not swollen or erythematous. Hearing normal.  Neck: Supple, thyroid not palpable. No bruits, nodes or JVD. Respiratory: Respiratory effort normal.  BS equal and clear bilateral without rales, rhonci, wheezing or stridor. Cardio: Heart sounds are normal with regular rate and rhythm and no murmurs, rubs or gallops. Peripheral pulses are normal and equal bilaterally without edema. No aortic or femoral bruits. Chest: symmetric with normal excursions and percussion.  Abdomen: Soft, with Nl bowel sounds. Nontender, no guarding, rebound, hernias, masses, or organomegaly.  Lymphatics: Non tender without lymphadenopathy.  Musculoskeletal: Full ROM all peripheral extremities, joint stability, 5/5 strength, and normal gait. Skin: Warm and dry without rashes, lesions, cyanosis, clubbing or  ecchymosis.  Neuro: Cranial nerves intact, reflexes equal bilaterally. Normal muscle tone, no cerebellar symptoms. Sensation intact.  Pysch: Alert and oriented X 3 with normal affect, insight and judgment appropriate.   Assessment and Plan  1. Annual Preventative/Screening Exam    2. Labile hypertension  - EKG 12-Lead - Urinalysis, Routine w reflex microscopic - Microalbumin / creatinine urine ratio - CBC with Differential/Platelet - COMPLETE METABOLIC PANEL WITH GFR - Magnesium - TSH  3. Hyperlipidemia, mixed  - EKG 12-Lead - Lipid panel - TSH  4. Abnormal  glucose  - EKG 12-Lead - Hemoglobin A1c - Insulin, random  5. Vitamin D deficiency  - VITAMIN D 25 Hydroxy  6. 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 Testosterone deficiency  - Testosterone  9. Class 1 obesity due to excess calories without serious comorbidity with body mass index (BMI) of 33.0 to 33.9 in adult   10. Screening for colorectal cancer  - POC Hemoccult Bld/Stl   11. Screening for ischemic heart disease  - EKG 12-Lead  12. Family history of cerebrovascular event  - EKG 12-Lead  13. Fatigue, unspecified type  - Iron,Total/Total Iron Binding Cap - Vitamin B12 - Testosterone - CBC with Differential/Platelet - TSH  14. Medication management  - Urinalysis, Routine w reflex microscopic - Microalbumin / creatinine urine ratio - Testosterone - CBC with Differential/Platelet - COMPLETE METABOLIC PANEL WITH GFR - Magnesium - Lipid panel - TSH - Hemoglobin A1c - Insulin, random - VITAMIN D 25 Hydroxy  15. Screening examination for pulmonary tuberculosis  - TB Skin Test      Patient was counseled in prudent diet, weight control to achieve/maintain BMI less than 25, BP monitoring, regular exercise and medications as discussed.  Discussed med effects and SE's. Routine screening labs and tests as requested with regular follow-up as recommended. Over 40 minutes of exam, counseling, chart review and high complex critical decision making was performed   Cole Maw, MD

## 2020-09-10 NOTE — Patient Instructions (Addendum)
Due to recent changes in healthcare laws, you may see the results of your imaging and laboratory studies on MyChart before your provider has had a chance to review them.  We understand that in some cases there may be results that are confusing or concerning to you. Not all laboratory results come back in the same time frame and the provider may be waiting for multiple results in order to interpret others.  Please give [place] 48 hours in order for your provider to thoroughly review all the results before contacting the office for clarification of your results.   +++++++++++++++++++++++++++++++++  Vit D  & Vit C 1,000 mg   are recommended to help protect  against the Covid-19 and other Corona viruses.    Also it's recommended  to take  Zinc 50 mg  to help  protect against the Covid-19   and best place to get  is also on [email or web address]  and don't pay more than 6-8 cents /pill !  ================================= Coronavirus (COVID-19) Are you at risk?  Are you at risk for the Coronavirus (COVID-19)?  To be considered HIGH RISK for Coronavirus (COVID-19), you have to meet the following criteria:  . Traveled to [place], [place], [place], [place] or [place]; or in the [place] to [place], [place], [place]  . or [place]; and have fever, cough, and shortness of breath within the last 2 weeks of travel OR . Been in close contact with a person diagnosed with COVID-19 within the last 2 weeks and have  . fever, cough,and shortness of breath .  . IF YOU DO NOT MEET THESE CRITERIA, YOU ARE CONSIDERED LOW RISK FOR COVID-19.  What to do if you are HIGH RISK for COVID-19?  Marland Kitchen If you are having a medical emergency, call 911. . Seek medical care right away. Before you go to a doctor's office, urgent care or emergency department, .  call ahead and tell them about your recent travel, contact with someone diagnosed with COVID-19  .  and your symptoms.  . You should receive instructions from your  physician's office regarding next steps of care.  . When you arrive at healthcare provider, tell the healthcare staff immediately you have returned from  . visiting [place], [place], [place], [place] or [place]; or traveled in the [place] to [place], [place],  . [place] or [place] in the last two weeks or you have been in close contact with a person diagnosed with  . COVID-19 in the last 2 weeks.   . Tell the health care staff about your symptoms: fever, cough and shortness of breath. . After you have been seen by a medical provider, you will be either: o Tested for (COVID-19) and discharged home on quarantine except to seek medical care if  o symptoms worsen, and asked to  - Stay home and avoid contact with others until you get your results (4-5 days)  - Avoid travel on public transportation if possible (such as bus, train, or airplane) or o Sent to the Emergency Department by EMS for evaluation, COVID-19 testing  and  o possible admission depending on your condition and test results.  What to do if you are LOW RISK for COVID-19?  Reduce your risk of any infection by using the same precautions used for avoiding the common cold or flu:  Marland Kitchen Wash your hands often with soap and warm water for at least 20 seconds.  If soap and water are not readily  available,  . use an alcohol-based hand sanitizer with at least 60% alcohol.  . If coughing or sneezing, cover your mouth and nose by coughing or sneezing into the elbow areas of your shirt or coat, .  into a tissue or into your sleeve (not your hands). . Avoid shaking hands with others and consider head nods or verbal greetings only. . Avoid touching your eyes, nose, or mouth with unwashed hands.  . Avoid close contact with people who are sick. . Avoid places or events with large numbers of people in one location, like concerts or sporting events. . Carefully consider travel plans you have or are making. . If you are planning any travel  outside or inside the [place], visit the CDC's Travelers' Health webpage for the latest health notices. . If you have some symptoms but not all symptoms, continue to monitor at home and seek medical attention  . if your symptoms worsen. . If you are having a medical emergency, call 911. >>>>>>>>>>>>>>>>>>>>>>>> Preventive Care for Adults  A healthy lifestyle and preventive care can promote health and wellness. Preventive health guidelines for men include the following key practices:  A routine yearly physical is a good way to check with your health care provider about your health and preventative screening. It is a chance to share any concerns and updates on your health and to receive a thorough exam.  Visit your dentist for a routine exam and preventative care every 6 months. Brush your teeth twice a day and floss once a day. Good oral hygiene prevents tooth decay and gum disease.  The frequency of eye exams is based on your age, health, family medical history, use of contact lenses, and other factors. Follow your health care provider's recommendations for frequency of eye exams.  Eat a healthy diet. Foods such as vegetables, fruits, whole grains, low-fat dairy products, and lean protein foods contain the nutrients you need without too many calories. Decrease your intake of foods high in solid fats, added sugars, and salt. Eat the right amount of calories for you. Get information about a proper diet from your health care provider, if necessary.  Regular physical exercise is one of the most important things you can do for your health. Most adults should get at least 150 minutes of moderate-intensity exercise (any activity that increases your heart rate and causes you to sweat) each week. In addition, most adults need muscle-strengthening exercises on 2 or more days a week.  Maintain a healthy weight. The body mass index (BMI) is a screening tool to identify possible weight problems. It provides an  estimate of body fat based on height and weight. Your health care provider can find your BMI and can help you achieve or maintain a healthy weight. For adults 20 years and older:  A BMI below 18.5 is considered underweight.  A BMI of 18.5 to 24.9 is normal.  A BMI of 25 to 29.9 is considered overweight.  A BMI of 30 and above is considered obese.  Maintain normal blood lipids and cholesterol levels by exercising and minimizing your intake of saturated fat. Eat a balanced diet with plenty of fruit and vegetables. Blood tests for lipids and cholesterol should begin at age 45 and be repeated every 5 years. If your lipid or cholesterol levels are high, you are over 50, or you are at high risk for heart disease, you may need your cholesterol levels checked more frequently. Ongoing high lipid and cholesterol levels should be treated  with medicines if diet and exercise are not working.  If you smoke, find out from your health care provider how to quit. If you do not use tobacco, do not start.  Lung cancer screening is recommended for adults aged 2-80 years who are at high risk for developing lung cancer because of a history of smoking. A yearly low-dose CT scan of the lungs is recommended for people who have at least a 30-pack-year history of smoking and are a current smoker or have quit within the past 15 years. A pack year of smoking is smoking an average of 1 pack of cigarettes a day for 1 year (for example: 1 pack a day for 30 years or 2 packs a day for 15 years). Yearly screening should continue until the smoker has stopped smoking for at least 15 years. Yearly screening should be stopped for people who develop a health problem that would prevent them from having lung cancer treatment.  If you choose to drink alcohol, do not have more than 2 drinks per day. One drink is considered to be 12 ounces (355 mL) of beer, 5 ounces (148 mL) of wine, or 1.5 ounces (44 mL) of liquor.  High blood pressure  causes heart disease and increases the risk of stroke. Your blood pressure should be checked. Ongoing high blood pressure should be treated with medicines, if weight loss and exercise are not effective.  If you are 35-59 years old, ask your health care provider if you should take aspirin to prevent heart disease.  Diabetes screening involves taking a blood sample to check your fasting blood sugar level. Testing should be considered at a younger age or be carried out more frequently if you are overweight and have at least 1 risk factor for diabetes.  Colorectal cancer can be detected and often prevented. Most routine colorectal cancer screening begins at the age of 50 and continues through age 67. However, your health care provider may recommend screening at an earlier age if you have risk factors for colon cancer. On a yearly basis, your health care provider may provide home test kits to check for hidden blood in the stool. Use of a small camera at the end of a tube to directly examine the colon (sigmoidoscopy or colonoscopy) can detect the earliest forms of colorectal cancer. Talk to your health care provider about this at age 75, when routine screening begins. Direct exam of the colon should be repeated every 5-10 years through age 50, unless early forms of precancerous polyps or small growths are found.  Screening for abdominal aortic aneurysm (AAA)  are recommended for persons over age 31 who have history of hypertensionor who are current or former smokers.  Talk with your health care provider about prostate cancer screening.  Testicular cancer screening is recommended for adult males. Screening includes self-exam, a health care provider exam, and other screening tests. Consult with your health care provider about any symptoms you have or any concerns you have about testicular cancer.  Use sunscreen. Apply sunscreen liberally and repeatedly throughout the day. You should seek shade when your shadow  is shorter than you. Protect yourself by wearing long sleeves, pants, a wide-brimmed hat, and sunglasses year round, whenever you are outdoors.  Once a month, do a whole-body skin exam, using a mirror to look at the skin on your back. Tell your health care provider about new moles, moles that have irregular borders, moles that are larger than a pencil eraser, or moles that  have changed in shape or color.  Stay current with required vaccines (immunizations).  Influenza vaccine. All adults should be immunized every year.  Tetanus, diphtheria, and acellular pertussis (Td, Tdap) vaccine. An adult who has not previously received Tdap or who does not know his vaccine status should receive 1 dose of Tdap. This initial dose should be followed by tetanus and diphtheria toxoids (Td) booster doses every 10 years. Adults with an unknown or incomplete history of completing a 3-dose immunization series with Td-containing vaccines should begin or complete a primary immunization series including a Tdap dose. Adults should receive a Td booster every 10 years.  Zoster vaccine. One dose is recommended for adults aged 60 years or older unless certain conditions are present.    Pneumococcal 13-valent conjugate (PCV13) vaccine. When indicated, a person who is uncertain of his immunization history and has no record of immunization should receive the PCV13 vaccine. An adult aged 19 years or older who has certain medical conditions and has not been previously immunized should receive 1 dose of PCV13 vaccine. This PCV13 should be followed with a dose of pneumococcal polysaccharide (PPSV23) vaccine. The PPSV23 vaccine dose should be obtained at least 8 weeks after the dose of PCV13 vaccine. An adult aged 19 years or older who has certain medical conditions and previously received 1 or more doses of PPSV23 vaccine should receive 1 dose of PCV13. The PCV13 vaccine dose should be obtained 1 or more years after the last PPSV23  vaccine dose.    Pneumococcal polysaccharide (PPSV23) vaccine. When PCV13 is also indicated, PCV13 should be obtained first. All adults aged 65 years and older should be immunized. An adult younger than age 65 years who has certain medical conditions should be immunized. Any person who resides in a nursing home or long-term care facility should be immunized. An adult smoker should be immunized. People with an immunocompromised condition and certain other conditions should receive both PCV13 and PPSV23 vaccines. People with human immunodeficiency virus (HIV) infection should be immunized as soon as possible after diagnosis. Immunization during chemotherapy or radiation therapy should be avoided. Routine use of PPSV23 vaccine is not recommended for American Indians, [place] Natives, or people younger than 65 years unless there are medical conditions that require PPSV23 vaccine. When indicated, people who have unknown immunization and have no record of immunization should receive PPSV23 vaccine. One-time revaccination 5 years after the first dose of PPSV23 is recommended for people aged 19-64 years who have chronic kidney failure, nephrotic syndrome, asplenia, or immunocompromised conditions. People who received 1-2 doses of PPSV23 before age 65 years should receive another dose of PPSV23 vaccine at age 65 years or later if at least 5 years have passed since the previous dose. Doses of PPSV23 are not needed for people immunized with PPSV23 at or after age 65 years.  Hepatitis A vaccine. Adults who wish to be protected from this disease, have certain high-risk conditions, work with hepatitis A-infected animals, work in hepatitis A research labs, or travel to or work in countries with a high rate of hepatitis A should be immunized. Adults who were previously unvaccinated and who anticipate close contact with an international adoptee during the first 60 days after arrival in the [place] from a country with a  high rate of hepatitis A should be immunized.  Hepatitis B vaccine. Adults should be immunized if they wish to be protected from this disease, have certain high-risk conditions, may be exposed to blood or other infectious   body fluids, are household contacts or sex partners of hepatitis B positive people, are clients or workers in certain care facilities, or travel to or work in countries with a high rate of hepatitis B.  Preventive Service / Frequency  Ages 19 to 28  Blood pressure check.  Lipid and cholesterol check.  Hepatitis C blood test.** / For any individual with known risks for hepatitis C.  Skin self-exam. / Monthly.  Influenza vaccine. / Every year.  Tetanus, diphtheria, and acellular pertussis (Tdap, Td) vaccine.** / Consult your health care provider. 1 dose of Td every 10 years.  HPV vaccine. / 3 doses over 6 months, if 49 or younger.  Measles, mumps, rubella (MMR) vaccine.** / You need at least 1 dose of MMR if you were born in 1957 or later. You may also need a second dose.  Pneumococcal 13-valent conjugate (PCV13) vaccine.** / Consult your health care provider.  Pneumococcal polysaccharide (PPSV23) vaccine.** / 1 to 2 doses if you smoke cigarettes or if you have certain conditions.  Meningococcal vaccine.** / 1 dose if you are age 33 to 88 years and a Market researcher living in a residence hall, or have one of several medical conditions. You may also need additional booster doses.  Hepatitis A vaccine.** / Consult your health care provider.  Hepatitis B vaccine.** / Consult your health care provider. +++++++++ Recommend Adult Low Dose Aspirin or  coated  Aspirin 81 mg daily  To reduce risk of Colon Cancer 40 %,  Skin Cancer 26 % ,  Melanoma 46%  and  Pancreatic cancer 60% ++++++++++++++++++ Vitamin D goal  is between 70-100.  Please make sure that you are taking your Vitamin D as directed.  It is very important as a natural anti-inflammatory   helping hair, skin, and nails, as well as reducing stroke and heart attack risk.  It helps your bones and helps with mood. It also decreases numerous cancer risks so please take it as directed.  Low Vit D is associated with a 200-300% higher risk for CANCER  and 200-300% higher risk for HEART   ATTACK  &  STROKE.   .....................................Marland Kitchen It is also associated with higher death rate at younger ages,  autoimmune diseases like Rheumatoid arthritis, Lupus, Multiple Sclerosis.    Also many other serious conditions, like depression, Alzheimer's Dementia, infertility, muscle aches, fatigue, fibromyalgia - just to name a few. +++++++++++++++++++++ Recommend the book "The END of DIETING" by Dr Excell Seltzer  & the book "The END of DIABETES " by Dr Excell Seltzer At [email or web address] - get book & Audio CD's    Being diabetic has a  300% increased risk for heart attack, stroke, cancer, and alzheimer- type vascular dementia. It is very important that you work harder with diet by avoiding all foods that are white. Avoid white rice (brown & wild rice is OK), white potatoes (sweetpotatoes in moderation is OK), White bread or wheat bread or anything made out of white flour like bagels, donuts, rolls, buns, biscuits, cakes, pastries, cookies, pizza crust, and pasta (made from white flour & egg whites) - vegetarian pasta or spinach or wheat pasta is OK. Multigrain breads like Arnold's or [employer], or multigrain sandwich thins or flatbreads.  Diet, exercise and weight loss can reverse and cure diabetes in the early stages.  Diet, exercise and weight loss is very important in the control and prevention of complications of diabetes which affects every system in your body, ie. Brain - dementia/stroke, eyes -  glaucoma/blindness, heart - heart attack/heart failure, kidneys - dialysis, stomach - gastric paralysis, intestines - malabsorption, nerves - severe painful neuritis, circulation - gangrene & loss of a  leg(s), and finally cancer and Alzheimers.    I recommend avoid fried & greasy foods,  sweets/candy, white rice (brown or wild rice or Quinoa is OK), white potatoes (sweet potatoes are OK) - anything made from white flour - bagels, doughnuts, rolls, buns, biscuits,white and wheat breads, pizza crust and traditional pasta made of white flour & egg white(vegetarian pasta or spinach or wheat pasta is OK).  Multi-grain bread is OK - like multi-grain flat bread or sandwich thins. Avoid alcohol in excess. Exercise is also important.    Eat all the vegetables you want - avoid meat, especially red meat and dairy - especially cheese.  Cheese is the most concentrated form of trans-fats which is the worst thing to clog up our arteries. Veggie cheese is OK which can be found in the fresh produce section at [employer] or [employer] or [employer]  +++++++++++++++++++ DASH Eating Plan  DASH stands for "Dietary Approaches to Stop Hypertension."   The DASH eating plan is a healthy eating plan that has been shown to reduce high blood pressure (hypertension). Additional health benefits may include reducing the risk of type 2 diabetes mellitus, heart disease, and stroke. The DASH eating plan may also help with weight loss. WHAT DO I NEED TO KNOW ABOUT THE DASH EATING PLAN? For the DASH eating plan, you will follow these general guidelines:  Choose foods with a percent daily value for sodium of less than 5% (as listed on the food label).  Use salt-free seasonings or herbs instead of table salt or sea salt.  Check with your health care provider or pharmacist before using salt substitutes.  Eat lower-sodium products, often labeled as "lower sodium" or "no salt added."  Eat fresh foods.  Eat more vegetables, fruits, and low-fat dairy products.  Choose whole grains. Look for the word "whole" as the first word in the ingredient list.  Choose fish   Limit sweets, desserts, sugars, and sugary drinks.  Choose  heart-healthy fats.  Eat veggie cheese   Eat more home-cooked food and less restaurant, buffet, and fast food.  Limit fried foods.  Cook foods using methods other than frying.  Limit canned vegetables. If you do use them, rinse them well to decrease the sodium.  When eating at a restaurant, ask that your food be prepared with less salt, or no salt if possible.                      WHAT FOODS CAN I EAT? Read Dr Fara Olden Fuhrman's books on The End of Dieting & The End of Diabetes  Grains Whole grain or whole wheat bread. Brown rice. Whole grain or whole wheat pasta. Quinoa, bulgur, and whole grain cereals. Low-sodium cereals. Corn or whole wheat flour tortillas. Whole grain cornbread. Whole grain crackers. Low-sodium crackers.  Vegetables Fresh or frozen vegetables (raw, steamed, roasted, or grilled). Low-sodium or reduced-sodium tomato and vegetable juices. Low-sodium or reduced-sodium tomato sauce and paste. Low-sodium or reduced-sodium canned vegetables.   Fruits All fresh, canned (in natural juice), or frozen fruits.  Protein Products  All fish and seafood.  Dried beans, peas, or lentils. Unsalted nuts and seeds. Unsalted canned beans.  Dairy Low-fat dairy products, such as skim or 1% milk, 2% or reduced-fat cheeses, low-fat ricotta or cottage cheese, or plain low-fat yogurt. Low-sodium  or reduced-sodium cheeses.  Fats and Oils Tub margarines without trans fats. Light or reduced-fat mayonnaise and salad dressings (reduced sodium). Avocado. Safflower, olive, or canola oils. Natural peanut or almond butter.  Other Unsalted popcorn and pretzels. The items listed above may not be a complete list of recommended foods or beverages. Contact your dietitian for more options.  +++++++++++++++++++  WHAT FOODS ARE NOT RECOMMENDED? Grains/ White flour or wheat flour White bread. White pasta. White rice. Refined cornbread. Bagels and croissants. Crackers that contain trans  fat.  Vegetables  Creamed or fried vegetables. Vegetables in a . Regular canned vegetables. Regular canned tomato sauce and paste. Regular tomato and vegetable juices.  Fruits Dried fruits. Canned fruit in light or heavy syrup. Fruit juice.  Meat and Other Protein Products Meat in general - RED meat & White meat.  Fatty cuts of meat. Ribs, chicken wings, all processed meats as bacon, sausage, bologna, salami, fatback, hot dogs, bratwurst and packaged luncheon meats.  Dairy Whole or 2% milk, cream, half-and-half, and cream cheese. Whole-fat or sweetened yogurt. Full-fat cheeses or blue cheese. Non-dairy creamers and whipped toppings. Processed cheese, cheese spreads, or cheese curds.  Condiments Onion and garlic salt, seasoned salt, table salt, and sea salt. Canned and packaged gravies. Worcestershire sauce. Tartar sauce. Barbecue sauce. Teriyaki sauce. Soy sauce, including reduced sodium. Steak sauce. Fish sauce. Oyster sauce. Cocktail sauce. Horseradish. Ketchup and mustard. Meat flavorings and tenderizers. Bouillon cubes. Hot sauce. Tabasco sauce. Marinades. Taco seasonings. Relishes.  Fats and Oils Butter, stick margarine, lard, shortening and bacon fat. Coconut, palm kernel, or palm oils. Regular salad dressings.  Pickles and olives. Salted popcorn and pretzels.  The items listed above may not be a complete list of foods and beverages to avoid.  ++++++++++++++++++++++++++  Living With Attention Deficit Hyperactivity Disorder If you have been diagnosed with attention deficit hyperactivity disorder (ADHD), you may be relieved that you now know why you have felt or behaved a certain way. Still, you may feel overwhelmed about the treatment ahead. You may also wonder how to get the support you need and how to deal with the condition day-to-day. With treatment and support, you can live with ADHD and manage your symptoms. How to manage lifestyle changes Managing stress Stress is your  body's reaction to life changes and events, both good and bad. To cope with the stress of an ADHD diagnosis, it may help to:  Learn more about ADHD.  Exercise regularly. Even a short daily walk can lower stress levels.  Participate in training or education programs (including social skills training classes) that teach you to deal with symptoms.  Medicines Your health care provider may suggest certain medicines if he or she feels that they will help to improve your condition. Stimulant medicines are usually prescribed to treat ADHD, and therapy may also be prescribed. It is important to:  Avoid using alcohol and other substances that may prevent your medicines from working properly (may interact).  Talk with your pharmacist or health care provider about all the medicines that you take, their possible side effects, and what medicines are safe to take together.  Make it your goal to take part in all treatment decisions (shared decision-making). Ask about possible side effects of medicines that your health care provider recommends, and tell him or her how you feel about having those side effects. It is best if shared decision-making with your health care provider is part of your total treatment plan. Relationships To strengthen your relationships with  family members while treating your condition, consider taking part in family therapy. You might also attend self-help groups alone or with a loved one. Be honest about how your symptoms affect your relationships. Make an effort to communicate respectfully instead of fighting, and find ways to show others that you care. Psychotherapy may be useful in helping you cope with how ADHD affects your relationships. How to recognize changes in your condition The following signs may mean that your treatment is working well and your condition is improving:  Consistently being on time for appointments.  Being more organized at home and work.  Other people  noticing improvements in your behavior.  Achieving goals that you set for yourself.  Thinking more clearly. The following signs may mean that your treatment is not working very well:  Feeling impatience or more confusion.  Missing, forgetting, or being late for appointments.  An increasing sense of disorganization and messiness.  More difficulty in reaching goals that you set for yourself.  Loved ones becoming angry or frustrated with you. Follow these instructions at home:  Take over-the-counter and prescription medicines only as told by your health care provider. Check with your health care provider before taking any new medicines.  Create structure and an organized atmosphere at home. For example: ? Make a list of tasks, then rank them from most important to least important. Work on one task at a time until your listed tasks are done. ? Make a daily schedule and follow it consistently every day. ? Use an appointment calendar, and check it 2 or 3 times a day to keep on track. Keep it with you when you leave the house. ? Create spaces where you keep certain things, and always put things back in their places after you use them.  Keep all follow-up visits as told by your health care provider. This is important. Where to find support Talking to others  Keep emotion out of important discussions and speak in a calm, logical way.  Listen closely and patiently to your loved ones. Try to understand their point of view, and try to avoid getting defensive.  Take responsibility for the consequences of your actions.  Ask that others do not take your behaviors personally.  Aim to solve problems as they come up, and express your feelings instead of bottling them up.  Talk openly about what you need from your loved ones and how they can support you.  Consider going to family therapy sessions or having your family meet with a specialist who deals with ADHD-related behavior problems.   Finances Not all insurance plans cover mental health care, so it is important to check with your insurance carrier. If paying for co-pays or counseling services is a problem, search for a local or county mental health care center. Public mental health care services may be offered there at a low cost or no cost when you are not able to see a private health care provider. If you are taking medicine for ADHD, you may be able to get the generic form, which may be less expensive than brand-name medicine. Some makers of prescription medicines also offer help to patients who cannot afford the medicines that they need. Questions to ask your health care provider:  What are the risks and benefits of taking medicines?  Would I benefit from therapy?  How often should I follow up with a health care provider? Contact a health care provider if:  You have side effects from your medicines, such  as: ? Repeated muscle twitches, coughing, or speech outbursts. ? Sleep problems. ? Loss of appetite. ? Depression. ? New or worsening behavior problems. ? Dizziness. ? Unusually fast heartbeat. ? Stomach pains. ? Headaches. Get help right away if:  You have a severe reaction to a medicine.  Your behavior suddenly gets worse. Summary  With treatment and support, you can live with ADHD and manage your symptoms.  The medicines that are most often prescribed for ADHD are stimulants.  Consider taking part in family therapy or self-help groups with family members or friends.  When you talk with friends and family about your ADHD, be patient and communicate openly.  Take over-the-counter and prescription medicines only as told by your health care provider. Check with your health care provider before taking any new medicines. This information is not intended to replace advice given to you by your health care provider. Make sure you discuss any questions you have with your health care provider. Document  Revised: 09/25/2019 Document Reviewed: 09/25/2019 Elsevier Patient Education  2021 [employer].  +++++++++++++++++++++++++++++  Supporting Someone With Attention Deficit Hyperactivity Disorder Attention deficit hyperactivity disorder (ADHD) is a behavior problem that is present in a person due to the way that his or her brain functions (neurobehavioral disorder). It is a common cause of behavioral and learning (academic) problems among children. ADHD is a long-term (chronic) condition. If this disorder is not treated, it can have serious effects into adolescence and adulthood. When a person has ADHD, his or her condition can affect others around him or her, such as friends and family members. Friends and family can help by offering support and understanding. What do I need to know about this condition? ADHD can affect daily functioning in ways that often cause problems for the person with ADHD and his or her friends and family members. A child with ADHD may:  Have a poor attention span. This means that he or she can only stay focused or interested in something for a short time.  Get distracted easily.  Have trouble listening to instructions.  Daydream.  Make careless mistakes.  Be forgetful.  Talk too much, such as blurting out answers to questions.  Have trouble sitting still for long.  Fidget or get out of his or her seat during class. An adult with ADHD may:  Get distracted easily.  Be disorganized at home and work.  Miss, forget, or be late for appointments.  Have trouble with details.  Have trouble completing tasks.  Be irritable and impatient.  Get bored easily during meetings.  Have great difficulty concentrating. What do I need to know about the treatment options? Treatment for this condition usually involves:  Behavioral treatment. Working with a Transport planner, the person with ADHD may: ? Set rewards for desired behavior. ? Set small goals and clear  expectations, and be held accountable for meeting them. ? Get help with planning and timing activities. ? Become more patient and more mindful of the condition.  Medicines, such as: ? Stimulant medicines that help a person to:  Control his or her behavior (decrease impulsivity).  Control his or her extra physical activity (decrease hyperactivity).  Increase his or her ability to pay attention. ? Antidepressants. ? Certain blood pressure medicines.  Structured classroom management for children at school, such as tutoring or extra support in classes.  Techniques for parents to use at home to help manage their child's symptoms and behavior. These include rewarding good behavior, providing consistent discipline, and setting  limits.  How can I support my loved one? Talk about the condition  Pick a time to talk with your loved one when distractions and interruptions are unlikely.  Let your loved one know that he or she is capable of success. Focus on your loved one's strengths, and try to not let your loved one use ADHD as an excuse for undesirable behavior.  Let your loved one know that there are well-known, successful people who also have ADHD. This may be encouraging to your loved one.  Give your loved one time to process his or her thoughts and to ask questions.  Children with ADHD may benefit from hearing more about how their treatment plan will help them. This may help them focus on goal behaviors. Find support and resources A health care provider may be able to recommend resources that are available online or over the phone. You could start with:  Attention Deficit Disorder Association (ADDA): [employer]com.cy  National Institute of Mental Health [facility]): AntiagingAlternatives.com.cy.shtml  Training classes or conferences that help parents of children with ADHD to support their children and cope with the  disorder.  Support groups for families who are affected by ADHD. General support If you are a parent of a child with ADHD, you can take the following actions to support your child's education:  Talk to teachers about the ways that your child learns best.  Be your child's advocate and stay in touch with his or her school about all problems related to ADHD.  At the end of the summer, make appointments to talk with teachers and other school staff before the new school year begins.  Listen to teachers carefully, and share your child's history with them.  Create a behavior plan that your child, your family, and the teachers can agree on. Write down goals to help your child succeed. How should I care for myself? It is important to find ways to care for your body, mind, and well-being while supporting someone with ADHD.  Spend time with friends and family. Find someone you can talk to who will also help you work on using coping skills to manage stress.  Understand what your limits are. Say "no" to requests or events that lead to a schedule that is too busy.  Make time for activities that help you relax, and try to not feel guilty about taking time for yourself.  Consider trying meditation and deep breathing exercises to lower your stress.  Get plenty of sleep.  Exercise, even if it is just taking a short walk a few times a week.  If you are a parent of a child with ADHD, arrange for child care so you can take breaks once in a while. What are some signs that the condition is getting worse? Signs that your loved one's condition may be getting worse include:  Increased trouble completing tasks and paying attention.  Hyperactivity and impulsivity.  Problems with relationships.  Impatience, restlessness, and mood swings.  Worsening problems at school, if applicable. Contact a health care provider if:  Your loved one's symptoms get worse.  Your loved one shows signs of depression,  anxiety, or another mental health condition.  Your child has behavioral problems at school. Summary  Attention deficit hyperactivity disorder (ADHD) is a long-term (chronic) condition that can affect daily functioning in ways that often cause problems for the person with ADHD and his or her loved ones.  This disorder can be treated effectively with medicine, behavioral treatment, and techniques to  manage symptoms and behaviors.  Many organizations and groups are available to help families to manage ADHD.  The support people in the life of someone with ADHD play an extremely important role in helping that person develop healthy behaviors to live a satisfying life.  It is important to find ways to care for your own body, mind, and well-being while supporting someone with ADHD. Make time for activities that help you relax. This information is not intended to replace advice given to you by your health care provider. Make sure you discuss any questions you have with your health care provider. Document Revised: 09/25/2019 Document Reviewed: 09/25/2019 Elsevier Patient Education  2021 [employer].

## 2020-09-11 ENCOUNTER — Other Ambulatory Visit: Payer: Self-pay

## 2020-09-11 ENCOUNTER — Ambulatory Visit (INDEPENDENT_AMBULATORY_CARE_PROVIDER_SITE_OTHER): Payer: BC Managed Care – PPO | Admitting: Internal Medicine

## 2020-09-11 VITALS — BP 136/84 | HR 80 | Temp 97.7°F | Resp 16 | Ht 70.0 in | Wt 226.4 lb

## 2020-09-11 DIAGNOSIS — R5383 Other fatigue: Secondary | ICD-10-CM

## 2020-09-11 DIAGNOSIS — E559 Vitamin D deficiency, unspecified: Secondary | ICD-10-CM

## 2020-09-11 DIAGNOSIS — E782 Mixed hyperlipidemia: Secondary | ICD-10-CM

## 2020-09-11 DIAGNOSIS — Z130 Encounter for screening for diseases of the blood and blood-forming organs and certain disorders involving the immune mechanism: Secondary | ICD-10-CM

## 2020-09-11 DIAGNOSIS — Z136 Encounter for screening for cardiovascular disorders: Secondary | ICD-10-CM | POA: Diagnosis not present

## 2020-09-11 DIAGNOSIS — E6609 Other obesity due to excess calories: Secondary | ICD-10-CM

## 2020-09-11 DIAGNOSIS — E349 Endocrine disorder, unspecified: Secondary | ICD-10-CM

## 2020-09-11 DIAGNOSIS — Z79899 Other long term (current) drug therapy: Secondary | ICD-10-CM | POA: Diagnosis not present

## 2020-09-11 DIAGNOSIS — Z111 Encounter for screening for respiratory tuberculosis: Secondary | ICD-10-CM

## 2020-09-11 DIAGNOSIS — Z1329 Encounter for screening for other suspected endocrine disorder: Secondary | ICD-10-CM | POA: Diagnosis not present

## 2020-09-11 DIAGNOSIS — Z0001 Encounter for general adult medical examination with abnormal findings: Secondary | ICD-10-CM

## 2020-09-11 DIAGNOSIS — I10 Essential (primary) hypertension: Secondary | ICD-10-CM | POA: Diagnosis not present

## 2020-09-11 DIAGNOSIS — R7309 Other abnormal glucose: Secondary | ICD-10-CM

## 2020-09-11 DIAGNOSIS — R0989 Other specified symptoms and signs involving the circulatory and respiratory systems: Secondary | ICD-10-CM

## 2020-09-11 DIAGNOSIS — Z0000 Encounter for general adult medical examination without abnormal findings: Secondary | ICD-10-CM | POA: Diagnosis not present

## 2020-09-11 DIAGNOSIS — Z13220 Encounter for screening for lipoid disorders: Secondary | ICD-10-CM

## 2020-09-11 DIAGNOSIS — Z8249 Family history of ischemic heart disease and other diseases of the circulatory system: Secondary | ICD-10-CM | POA: Diagnosis not present

## 2020-09-11 DIAGNOSIS — Z1211 Encounter for screening for malignant neoplasm of colon: Secondary | ICD-10-CM

## 2020-09-11 DIAGNOSIS — Z6833 Body mass index (BMI) 33.0-33.9, adult: Secondary | ICD-10-CM

## 2020-09-11 DIAGNOSIS — Z1389 Encounter for screening for other disorder: Secondary | ICD-10-CM

## 2020-09-11 DIAGNOSIS — Z131 Encounter for screening for diabetes mellitus: Secondary | ICD-10-CM

## 2020-09-11 DIAGNOSIS — E8881 Metabolic syndrome: Secondary | ICD-10-CM

## 2020-09-11 DIAGNOSIS — F988 Other specified behavioral and emotional disorders with onset usually occurring in childhood and adolescence: Secondary | ICD-10-CM

## 2020-09-11 MED ORDER — AMPHETAMINE-DEXTROAMPHETAMINE 20 MG PO TABS: ORAL_TABLET | ORAL | 0 refills | Status: DC

## 2020-09-11 MED ORDER — BUPROPION HCL ER (XL) 300 MG PO TB24: ORAL_TABLET | ORAL | 3 refills | Status: DC

## 2020-09-12 NOTE — Progress Notes (Signed)
============================================================ -   Test results slightly outside the reference range are not unusual. If there is anything important, I will review this with you,  otherwise it is considered normal test values.  If you have further questions,  please do not hesitate to contact me at the office or via My Chart.  ============================================================ ============================================================  -  Testosterone = 360 - Normal - Great  ============================================================ ============================================================  -  Iron & Vitamin B12 levels both Normal & OK  ============================================================ ============================================================  -  Total Chol = 166 and LDL = 95 - Both  Excellent   - Very low risk for Heart Attack  / Stroke ============================================================ ============================================================  - A1c - Normal - Great  - No Diabetes  ! ============================================================ ============================================================  - Vitamin D = 82 - Excellent  ============================================================ ============================================================  - All Else - CBC - Kidneys - U/A -  Electrolytes - Liver - Magnesium & Thyroid    - all  Normal / OK ============================================================ ============================================================  - Keep up the [place] Work  !  ============================================================ ============================================================

## 2020-09-14 LAB — HEMOGLOBIN A1C
Hgb A1c MFr Bld: 5.5 % of total Hgb (ref <5.7)
Mean Plasma Glucose: 111 mg/dL
eAG (mmol/L): 6.2 mmol/L

## 2020-09-14 LAB — CBC WITH DIFFERENTIAL/PLATELET
Absolute Monocytes: 476 cells/uL (ref 200–950)
Basophils Absolute: 74 cells/uL (ref 0–200)
Basophils Relative: 1.1 %
Eosinophils Absolute: 817 cells/uL — ABNORMAL HIGH (ref 15–500)
Eosinophils Relative: 12.2 %
HCT: 42.4 % (ref 38.5–50.0)
Hemoglobin: 14.1 g/dL (ref 13.2–17.1)
Lymphs Abs: 2506 cells/uL (ref 850–3900)
MCH: 29.2 pg (ref 27.0–33.0)
MCHC: 33.3 g/dL (ref 32.0–36.0)
MCV: 87.8 fL (ref 80.0–100.0)
MPV: 10.3 fL (ref 7.5–12.5)
Monocytes Relative: 7.1 %
Neutro Abs: 2827 cells/uL (ref 1500–7800)
Neutrophils Relative %: 42.2 %
Platelets: 309 10*3/uL (ref 140–400)
RBC: 4.83 10*6/uL (ref 4.20–5.80)
RDW: 12.4 % (ref 11.0–15.0)
Total Lymphocyte: 37.4 %
WBC: 6.7 10*3/uL (ref 3.8–10.8)

## 2020-09-14 LAB — URINALYSIS, ROUTINE W REFLEX MICROSCOPIC
Bilirubin Urine: NEGATIVE
Glucose, UA: NEGATIVE
Hgb urine dipstick: NEGATIVE
Ketones, ur: NEGATIVE
Leukocytes,Ua: NEGATIVE
Nitrite: NEGATIVE
Protein, ur: NEGATIVE
Specific Gravity, Urine: 1.03 (ref 1.001–1.035)
pH: <=5 (ref 5.0–8.0)

## 2020-09-14 LAB — LIPID PANEL
Cholesterol: 166 mg/dL (ref <200)
HDL: 44 mg/dL (ref >=40)
LDL Cholesterol (Calc): 95 mg/dL (calc)
Non-HDL Cholesterol (Calc): 122 mg/dL (calc) (ref <130)
Total CHOL/HDL Ratio: 3.8 (calc) (ref <5.0)
Triglycerides: 167 mg/dL — ABNORMAL HIGH (ref <150)

## 2020-09-14 LAB — COMPLETE METABOLIC PANEL WITH GFR
AG Ratio: 1.7 (calc) (ref 1.0–2.5)
ALT: 56 U/L — ABNORMAL HIGH (ref 9–46)
AST: 33 U/L (ref 10–40)
Albumin: 4.7 g/dL (ref 3.6–5.1)
Alkaline phosphatase (APISO): 80 U/L (ref 36–130)
BUN: 18 mg/dL (ref 7–25)
CO2: 30 mmol/L (ref 20–32)
Calcium: 10 mg/dL (ref 8.6–10.3)
Chloride: 104 mmol/L (ref 98–110)
Creat: 0.91 mg/dL (ref 0.60–1.35)
GFR, Est African American: 123 mL/min/{1.73_m2} (ref >=60)
GFR, Est Non African American: 107 mL/min/{1.73_m2} (ref >=60)
Globulin: 2.7 g/dL (calc) (ref 1.9–3.7)
Glucose, Bld: 100 mg/dL — ABNORMAL HIGH (ref 65–99)
Potassium: 4.7 mmol/L (ref 3.5–5.3)
Sodium: 143 mmol/L (ref 135–146)
Total Bilirubin: 0.4 mg/dL (ref 0.2–1.2)
Total Protein: 7.4 g/dL (ref 6.1–8.1)

## 2020-09-14 LAB — MAGNESIUM: Magnesium: 2.1 mg/dL (ref 1.5–2.5)

## 2020-09-14 LAB — INSULIN, RANDOM: Insulin: 69.2 u[IU]/mL — ABNORMAL HIGH

## 2020-09-14 LAB — TSH: TSH: 1.76 mIU/L (ref 0.40–4.50)

## 2020-09-14 LAB — IRON, TOTAL/TOTAL IRON BINDING CAP
%SAT: 23 % (calc) (ref 20–48)
Iron: 86 ug/dL (ref 50–180)
TIBC: 379 mcg/dL (calc) (ref 250–425)

## 2020-09-14 LAB — VITAMIN B12: Vitamin B-12: 904 pg/mL (ref 200–1100)

## 2020-09-14 LAB — VITAMIN D 25 HYDROXY (VIT D DEFICIENCY, FRACTURES): Vit D, 25-Hydroxy: 82 ng/mL (ref 30–100)

## 2020-09-14 LAB — MICROALBUMIN / CREATININE URINE RATIO
Creatinine, Urine: 225 mg/dL (ref 20–320)
Microalb Creat Ratio: 4 mcg/mg creat (ref <30)
Microalb, Ur: 0.8 mg/dL

## 2020-09-14 LAB — TESTOSTERONE: Testosterone: 360 ng/dL (ref 250–827)

## 2020-09-14 NOTE — Progress Notes (Signed)
============================================================ ============================================================  -    Normal Insulin level is less than 20   - Your insulin level is high at 69, but is better than 219 - 10 months ago  !  - Elevated Insulin levels means  Insulin Resistance -  which is a sign a of   early diabetes and associated with a 300 % greater risk for heart attacks,   strokes, cancer & Alzheimer type vascular dementia   - All this can be cured  and prevented with losing weight   - get Dr Francis Dowse Fuhrman's book 'the End of Diabetes" and "the End of Dieting" and   add many years of good health to your life  !  ============================================================ ============================================================  (PS - taking Cinnamon 1,000 mg capsules 2 x /day    (you can get on [employer])   INCREASES Insulin Sensitivity and reverses Insulin Resistance  &   - Helps "fix" the problem  along with " Losing Weight "  ============================================================ ============================================================

## 2020-10-12 ENCOUNTER — Other Ambulatory Visit: Payer: Self-pay

## 2020-10-12 DIAGNOSIS — F988 Other specified behavioral and emotional disorders with onset usually occurring in childhood and adolescence: Secondary | ICD-10-CM

## 2020-10-13 MED ORDER — AMPHETAMINE-DEXTROAMPHETAMINE 20 MG PO TABS: ORAL_TABLET | ORAL | 0 refills | Status: DC

## 2020-10-14 ENCOUNTER — Other Ambulatory Visit: Payer: Self-pay | Admitting: Internal Medicine

## 2020-11-12 ENCOUNTER — Other Ambulatory Visit: Payer: Self-pay | Admitting: Internal Medicine

## 2020-11-12 ENCOUNTER — Other Ambulatory Visit: Payer: Self-pay

## 2020-11-12 DIAGNOSIS — F988 Other specified behavioral and emotional disorders with onset usually occurring in childhood and adolescence: Secondary | ICD-10-CM

## 2020-11-12 MED ORDER — AMPHETAMINE-DEXTROAMPHETAMINE 20 MG PO TABS: ORAL_TABLET | ORAL | 0 refills | Status: DC

## 2020-12-05 ENCOUNTER — Other Ambulatory Visit: Payer: Self-pay | Admitting: Internal Medicine

## 2020-12-05 DIAGNOSIS — E782 Mixed hyperlipidemia: Secondary | ICD-10-CM

## 2020-12-05 MED ORDER — ATORVASTATIN CALCIUM 20 MG PO TABS: ORAL_TABLET | ORAL | 3 refills | Status: DC

## 2020-12-13 ENCOUNTER — Other Ambulatory Visit: Payer: Self-pay | Admitting: Internal Medicine

## 2020-12-13 DIAGNOSIS — E782 Mixed hyperlipidemia: Secondary | ICD-10-CM

## 2020-12-13 MED ORDER — ATORVASTATIN CALCIUM 20 MG PO TABS: ORAL_TABLET | ORAL | 3 refills | Status: DC

## 2020-12-16 ENCOUNTER — Other Ambulatory Visit: Payer: Self-pay

## 2020-12-16 DIAGNOSIS — F988 Other specified behavioral and emotional disorders with onset usually occurring in childhood and adolescence: Secondary | ICD-10-CM

## 2020-12-16 MED ORDER — AMPHETAMINE-DEXTROAMPHETAMINE 20 MG PO TABS: ORAL_TABLET | ORAL | 0 refills | Status: DC

## 2020-12-18 ENCOUNTER — Other Ambulatory Visit: Payer: Self-pay | Admitting: Internal Medicine

## 2020-12-18 DIAGNOSIS — G4730 Sleep apnea, unspecified: Secondary | ICD-10-CM

## 2021-01-18 ENCOUNTER — Other Ambulatory Visit: Payer: Self-pay

## 2021-01-18 DIAGNOSIS — F988 Other specified behavioral and emotional disorders with onset usually occurring in childhood and adolescence: Secondary | ICD-10-CM

## 2021-01-18 MED ORDER — AMPHETAMINE-DEXTROAMPHETAMINE 20 MG PO TABS: ORAL_TABLET | ORAL | 0 refills | Status: DC

## 2021-02-12 ENCOUNTER — Other Ambulatory Visit: Payer: Self-pay

## 2021-02-12 DIAGNOSIS — F988 Other specified behavioral and emotional disorders with onset usually occurring in childhood and adolescence: Secondary | ICD-10-CM

## 2021-02-15 ENCOUNTER — Other Ambulatory Visit: Payer: Self-pay

## 2021-02-15 DIAGNOSIS — F988 Other specified behavioral and emotional disorders with onset usually occurring in childhood and adolescence: Secondary | ICD-10-CM

## 2021-02-15 MED ORDER — AMPHETAMINE-DEXTROAMPHETAMINE 20 MG PO TABS: ORAL_TABLET | ORAL | 0 refills | Status: DC

## 2021-03-09 ENCOUNTER — Ambulatory Visit (INDEPENDENT_AMBULATORY_CARE_PROVIDER_SITE_OTHER): Payer: BC Managed Care – PPO | Admitting: Neurology

## 2021-03-09 ENCOUNTER — Other Ambulatory Visit: Payer: Self-pay

## 2021-03-09 ENCOUNTER — Encounter: Payer: Self-pay | Admitting: Neurology

## 2021-03-09 VITALS — BP 111/79 | HR 95 | Ht 70.0 in | Wt 232.0 lb

## 2021-03-09 DIAGNOSIS — R0683 Snoring: Secondary | ICD-10-CM

## 2021-03-09 DIAGNOSIS — G4710 Hypersomnia, unspecified: Secondary | ICD-10-CM | POA: Diagnosis not present

## 2021-03-09 DIAGNOSIS — G4730 Sleep apnea, unspecified: Secondary | ICD-10-CM

## 2021-03-09 DIAGNOSIS — E66811 Obesity, class 1: Secondary | ICD-10-CM

## 2021-03-09 DIAGNOSIS — Z9889 Other specified postprocedural states: Secondary | ICD-10-CM

## 2021-03-09 DIAGNOSIS — E669 Obesity, unspecified: Secondary | ICD-10-CM

## 2021-03-09 DIAGNOSIS — F988 Other specified behavioral and emotional disorders with onset usually occurring in childhood and adolescence: Secondary | ICD-10-CM

## 2021-03-09 NOTE — Patient Instructions (Signed)
Sleep Apnea ?Sleep apnea affects breathing during sleep. It causes breathing to stop for 10 seconds or more, or to become shallow. People with sleep apnea usually snore loudly. ?It can also increase the risk of: ?Heart attack. ?Stroke. ?Being very overweight (obese). ?Diabetes. ?Heart failure. ?Irregular heartbeat. ?High blood pressure. ?The goal of treatment is to help you breathe normally again. ?What are the causes? ?The most common cause of this condition is a collapsed or blocked airway. ?There are three kinds of sleep apnea: ?Obstructive sleep apnea. This is caused by a blocked or collapsed airway. ?Central sleep apnea. This happens when the brain does not send the right signals to the muscles that control breathing. ?Mixed sleep apnea. This is a combination of obstructive and central sleep apnea. ?What increases the risk? ?Being overweight. ?Smoking. ?Having a small airway. ?Being older. ?Being male. ?Drinking alcohol. ?Taking medicines to calm yourself (sedatives or tranquilizers). ?Having family members with the condition. ?Having a tongue or tonsils that are larger than normal. ?What are the signs or symptoms? ?Trouble staying asleep. ?Loud snoring. ?Headaches in the morning. ?Waking up gasping. ?Dry mouth or sore throat in the morning. ?Being sleepy or tired during the day. ?If you are sleepy or tired during the day, you may also: ?Not be able to focus your mind (concentrate). ?Forget things. ?Get angry a lot and have mood swings. ?Feel sad (depressed). ?Have changes in your personality. ?Have less interest in sex, if you are male. ?Be unable to have an erection, if you are male. ?How is this treated? ? ?Sleeping on your side. ?Using a medicine to get rid of mucus in your nose (decongestant). ?Avoiding the use of alcohol, medicines to help you relax, or certain pain medicines (narcotics). ?Losing weight, if needed. ?Changing your diet. ?Quitting smoking. ?Using a machine to open your airway while you  sleep, such as: ?An oral appliance. This is a mouthpiece that shifts your lower jaw forward. ?A CPAP device. This device blows air through a mask when you breathe out (exhale). ?An EPAP device. This has valves that you put in each nostril. ?A BIPAP device. This device blows air through a mask when you breathe in (inhale) and breathe out. ?Having surgery if other treatments do not work. ?Follow these instructions at home: ?Lifestyle ?Make changes that your doctor recommends. ?Eat a healthy diet. ?Lose weight if needed. ?Avoid alcohol, medicines to help you relax, and some pain medicines. ?Do not smoke or use any products that contain nicotine or tobacco. If you need help quitting, ask your doctor. ?General instructions ?Take over-the-counter and prescription medicines only as told by your doctor. ?If you were given a machine to use while you sleep, use it only as told by your doctor. ?If you are having surgery, make sure to tell your doctor you have sleep apnea. You may need to bring your device with you. ?Keep all follow-up visits. ?Contact a doctor if: ?The machine that you were given to use during sleep bothers you or does not seem to be working. ?You do not get better. ?You get worse. ?Get help right away if: ?Your chest hurts. ?You have trouble breathing in enough air. ?You have an uncomfortable feeling in your back, arms, or stomach. ?You have trouble talking. ?One side of your body feels weak. ?A part of your face is hanging down. ?These symptoms may be an emergency. Get help right away. Call your local emergency services (911 in the [place] ?Do not wait to see if the symptoms   will go away. ?Do not drive yourself to the hospital. ?Summary ?This condition affects breathing during sleep. ?The most common cause is a collapsed or blocked airway. ?The goal of treatment is to help you breathe normally while you sleep. ?This information is not intended to replace advice given to you by your health care provider. Make  sure you discuss any questions you have with your health care provider. ?Document Revised: 11/18/2020 Document Reviewed: 03/20/2020 ?Elsevier Patient Education ? 2022 Elsevier Inc. ?Screening for Sleep Apnea ?Sleep apnea is a condition in which breathing pauses or becomes shallow during sleep. Sleep apnea screening is a test to determine if you are at risk for sleep apnea. The test includes a series of questions. It will only takes a few minutes. Your health care provider may ask you to have this test in preparation for surgery or as part of a physical exam. ?What are the symptoms of sleep apnea? ?Common symptoms of sleep apnea include: ?Snoring. ?Waking up often at night. ?Daytime sleepiness. ?Pauses in breathing. ?Choking or gasping during sleep. ?Irritability. ?Forgetfulness. ?Trouble thinking clearly. ?Depression. ?Personality changes. ?Most people with sleep apnea do not know that they have it. ?What are the advantages of sleep apnea screening? ?Getting screened for sleep apnea can help: ?Ensure your safety. It is important for your health care providers to know whether or not you have sleep apnea, especially if you are having surgery or have other long-term (chronic) health conditions. ?Improve your health and allow you to get a better night's rest. Restful sleep can help you: ?Have more energy. ?Lose weight. ?Improve high blood pressure. ?Improve diabetes management. ?Prevent stroke. ?Prevent car accidents. ?What happens during the screening? ?Screening usually includes being asked a list of questions about your sleep quality. Some questions you may be asked include: ?Do you snore? ?Is your sleep restless? ?Do you have daytime sleepiness? ?Has a partner or spouse told you that you stop breathing during sleep? ?Have you had trouble concentrating or memory loss? ?What is your age? ?What is your neck circumference? ?To measure your neck, keep your back straight and gently wrap the tape measure around your neck.  Put the tape measure at the middle of your neck, between your chin and collarbone. ?What is your sex assigned at birth? ?Do you have or are you being treated for high blood pressure? ?If your screening test is positive, you are at risk for the condition. Further testing may be needed to confirm a diagnosis of sleep apnea. ?Where to find more information ?You can find screening tools online or at your health care clinic. For more information about sleep apnea screening and healthy sleep, visit these websites: ?Centers for Disease Control and Prevention: www.cdc.gov ?American Sleep Apnea Association: www.sleepapnea.org ?Contact a health care provider if: ?You think that you may have sleep apnea. ?Summary ?Sleep apnea screening can help determine if you are at risk for sleep apnea. ?It is important for your health care providers to know whether or not you have sleep apnea, especially if you are having surgery or have other chronic health conditions. ?You may be asked to take a screening test for sleep apnea in preparation for surgery or as part of a physical exam. ?This information is not intended to replace advice given to you by your health care provider. Make sure you discuss any questions you have with your health care provider. ?Document Revised: 03/20/2020 Document Reviewed: 03/20/2020 ?Elsevier Patient Education ? 2022 Elsevier Inc. ? ?

## 2021-03-09 NOTE — Progress Notes (Signed)
SLEEP MEDICINE CLINIC    Provider:  Melvyn Novas, MD  Primary Care Physician:  Lucky Cowboy, MD [address] [place] [address]     Referring Provider: Lucky Cowboy, Md [address] [place],  [place] [address]          Chief Complaint according to patient   Patient presents with:     New Patient (Initial Visit)      Internal referral for witnessed sleep apnea. No prior sleep study. Snores all the time. Wife wakes him up telling him he stops breathing. Wakes up fatigued, never has energy. Mother has OSA. Mothers side has OSA hx.       HISTORY OF PRESENT ILLNESS:  Cole Lozano is a 40 y.o. year old White or Caucasian male patient seen here upon referral for Consultation on 03/09/2021 .      Cole Lozano  has a past medical history of ADD (attention deficit disorder), Allergy, Anxiety, Asthma, Depression, Hyperlipidemia, Hypertension, Insulin resistance, and Vitamin D deficiency.. Chief concern according to patient :   Internal referral for witnessed sleep apnea. No prior sleep study. Snores all the time. Wife wakes him up telling him he stops breathing. Wakes up fatigued, never has energy. Mother has OSA. 2 uncles on Mothers side have OSA hx.  Twin bother has video taped him during vacation- and he was shocked to see and hear the extend of sleep breathing. He cannot sleep supine      Sleep relevant medical history: witnessed apneas and loud snoring, can't breath in supine, 2 nocturia breaks, Sleep walking- strong history  in childhood, in college,  Tonsillectomy- adenoid and UPPP - at 40 years of age, following an infection by Epstein-Barr virus, mononucleosis.,  UPPP/ No nasal surgery but deviated septum, repeated sinusitis, always congested.     Social history:  Patient is working as  Consulting civil engineer( Orthoptist  and lives in a household with spouse and child  , one dog.. he works from 10 through 3 AM - project bound.  The  patient currently works/ from home.  Tobacco use; none.   ETOH use : less than one drink a week. ,  Caffeine intake in form of Coffee( /) Soda( /) Tea ( /) but many 2-4  energy drinks a day. Regular exercise in form of walking. Golfing, baseball coaching .      Sleep habits are as follows: The patient's dinner time is between 6 PM. The patient goes to bed at 3-4 AM  PM and continues to sleep for  hours, wakes for 1-2 bathroom breaks. The preferred sleep position is non-supine, with the support of 1-2 pillows. Dreams are reportedly infrequent/vivid.   6.45  AM is his alrm set but he often works until 3 AM - sleep deprived. He reports not feeling refreshed or restored in AM, with symptoms such as dry mouth, morning headaches, and residual fatigue.     Review of Systems: Out of a complete 14 system review, the patient complains of only the following symptoms, and all other reviewed systems are negative.:  Fatigue, sleepiness , snoring, fragmented sleep, earliest to bed- passing out-    How likely are you to doze in the following situations: 0 = not likely, 1 = slight chance, 2 = moderate chance, 3 = high chance   Sitting and Reading? Watching Television? Sitting inactive in a public place (theater or meeting)? As a passenger in a car for an hour without a  break? Lying down in the afternoon when circumstances permit? Sitting and talking to someone? Sitting quietly after lunch without alcohol? In a car, while stopped for a few minutes in traffic?   Total = 15/ 24 points   FSS endorsed at 62/ 63 points.   Social History   Socioeconomic History   Marital status: Married    Spouse name: Not on file   Number of children: Not on file   Years of education: Not on file   Highest education level: Not on file  Occupational History   Not on file  Tobacco Use   Smoking status: Never   Smokeless tobacco: Never  Substance and Sexual Activity   Alcohol use: Yes    Alcohol/week: 1.0  standard drink    Types: 1 drink(s) per week    Comment: socially   Drug use: No   Sexual activity: Yes  Other Topics Concern   Not on file  Social History Narrative   Right handed   Caffeine use: energy drinks-about 2 per day   Social Determinants of Health   Financial Resource Strain: Not on file  Food Insecurity: Not on file  Transportation Needs: Not on file  Physical Activity: Not on file  Stress: Not on file  Social Connections: Not on file    Family History  Problem Relation Age of Onset   Cancer Mother        skin   Neurofibromatosis Brother    Diabetes Other    Stroke Other     Past Medical History:  Diagnosis Date   ADD (attention deficit disorder)    Allergy    Anxiety    Asthma    Depression    Hyperlipidemia    Hypertension    Insulin resistance    history of normal A1C but insulin 60, 46   Vitamin D deficiency     Past Surgical History:  Procedure Laterality Date   ELBOW SURGERY Left 04/25/1989   s/p trauma   HERNIA REPAIR Right 04/25/2001   inguinal   OTHER SURGICAL HISTORY Right    thumb   TONSILLECTOMY AND ADENOIDECTOMY       Current Outpatient Medications on File Prior to Visit  Medication Sig Dispense Refill   albuterol (PROVENTIL HFA;VENTOLIN HFA) 108 (90 Base) MCG/ACT inhaler Inhale 2 puffs into the lungs every 6 (six) hours as needed for wheezing or shortness of breath. 18 g 3   amphetamine-dextroamphetamine (ADDERALL) 20 MG tablet 1/2 to 1 tablet 1 or 2 x /daily for ADD, should not take daily to avoid tolerance / addiction - script should last longer than 30 days 60 tablet 0   aspirin 81 MG tablet Take 81 mg by mouth daily.     atorvastatin (LIPITOR) 20 MG tablet Take  1 tablet  4 x /week   for Cholesterol 52 tablet 3   buPROPion (WELLBUTRIN XL) 300 MG 24 hr tablet Take  1 tablet  every Morning  for Focus & Concentration 90 tablet 3   Cholecalciferol (VITAMIN D PO) Take 10,000 Units by mouth daily.      citalopram (CELEXA) 40 MG  tablet TAKE 1 TABLET BY MOUTH EVERY DAY 90 tablet 3   minoxidil (ROGAINE) 2 % external solution Apply topically 2 (two) times daily.     montelukast (SINGULAIR) 10 MG tablet Take  1 tablet  Daily  for Allergies 90 tablet 3   multivitamin (ONE-A-DAY MEN'S) TABS tablet Take 1 tablet by mouth daily.  No current facility-administered medications on file prior to visit.    No Known Allergies  Physical exam:  Today's Vitals   03/09/21 1525  BP: 111/79  Pulse: 95  SpO2: 95%  Weight: 232 lb (105.2 kg)  Height: 5\' 10"  (1.778 m)   Body mass index is 33.29 kg/m.   Wt Readings from Last 3 Encounters:  03/09/21 232 lb (105.2 kg)  09/11/20 226 lb 6.4 oz (102.7 kg)  02/14/20 237 lb (107.5 kg)     Ht Readings from Last 3 Encounters:  03/09/21 5\' 10"  (1.778 m)  09/11/20 5\' 10"  (1.778 m)  11/14/19 5\' 10"  (1.778 m)      General: The patient is awake, alert and appears not in acute distress. The patient is well groomed. Head: Normocephalic, atraumatic. Neck is supple. Mallampati 2-UPPP status, small mouth, small jaws,  neck circumference: 17.5  inches . Nasal airflow barely patent.  Dental status: had several teeth pulled-   Cardiovascular:  Regular rate and cardiac rhythm by pulse,  without distended neck veins. Respiratory: Lungs are clear to auscultation.  Skin:  Without evidence of ankle edema, or rash. Trunk: The patient's posture is erect.   Neurologic exam : The patient is awake and alert, oriented to place and time.   Memory subjective described as intact.  Attention span & concentration ability appears normal.  Speech is fluent,  without  dysarthria, dysphonia or aphasia.  Mood and affect are appropriate.   Cranial nerves: no loss of smell or taste reported  Pupils are equal and briskly reactive to light. Funduscopic exam deferred. .  Extraocular movements in vertical and horizontal planes were intact and without nystagmus. No Diplopia. Visual fields by finger perimetry  are intact. Hearing was intact to soft voice and finger rubbing.    Facial sensation intact to fine touch.  Facial motor strength is symmetric and tongue and uvula move midline.  Neck ROM : rotation, tilt and flexion extension were normal for age and shoulder shrug was symmetrical.    Motor exam:  Symmetric bulk, tone and ROM.   Normal tone without cog wheeling, symmetric grip strength .   Sensory:  Fine touch, pinprick and vibration were tested  and  normal.  Proprioception tested in the upper extremities was normal.   Coordination: Rapid alternating movements in the fingers/hands were of normal speed.  The Finger-to-nose maneuver was intact without evidence of ataxia, dysmetria or tremor.   Gait and station: Patient could rise unassisted from a seated position, walked without assistive device.  Stance is of normal width.Toe and heel walk were deferred.  Deep tendon reflexes: in the  upper and lower extremities are symmetric and intact.  Babinski response was deferred       After spending a total time of  35  minutes face to face and additional time for physical and neurologic examination, review of laboratory studies,  personal review of imaging studies, reports and results of other testing and review of referral information / records as far as provided in visit, I have established the following assessments:  Cole Lozano has been affected by sleep disordered breathing probably for much of his wife.  He is a strong family history mother and 2 maternal uncles had obstructive sleep apnea.  He underwent a UPPP at the tender age of 28 after being infected with mononucleosis and having breathing problems.  Even today he has a small jaw rather crowded dentition and he cannot open the oral pathway very far.  So  he relies on mouth breathing at night, as his nasal passageway is also congested and he has a slight notable septal deviation.  This seems to affect the left side of the nose.  Currently  he is not just affected by witnessed apnea and snoring but he has also a very sleep unfriendly work schedule.     My goal would be for him to hopefully have seen the opportunity again to sleep 7 or even 8 hours but he is currently at a level of his project that will not allow him to cut these hours out.  He has used Adderall to stay awake and he acknowledges that this is a self-medication.  But I am going to concentrate on his treatment for obstructive sleep apnea after we have diagnosed that he indeed has obstructive sleep apnea #1 and 2 to which degree.  I would also be happy to offer him a nasal spray or nasal rinse to see if this will make it easier to have some nasal airway patency otherwise a referral to ENT would be helpful.    My Plan is to proceed with:  1) SPLIT night - SPLIT at AHI 20.  2) dental device if sleep apnea is mild, and mainly snoring.  3) sleep deprivation- I hope he can soon get 6-8 hours of sleep again.   I would like to thank Lucky Cowboy, MD and Lucky Cowboy, Md [address] [place],  [place] [address] for allowing me to meet with and to take care of this pleasant patient.   Cole Lozano has been affected by sleep disordered breathing probably for much of his wife.  He is a strong family history mother and 2 maternal uncles had obstructive sleep apnea.  He underwent a UPPP at the tender age of 50 after being infected with mononucleosis and having breathing problems.  Even today he has a small jaw rather crowded dentition and he cannot open the oral pathway very far.  So he relies on mouth breathing at night, as his nasal passageway is also congested and he has a slight notable septal deviation.  This seems to affect the left side of the nose.  Currently he is not just affected by witnessed apnea and snoring but he has also a very sleep unfriendly work schedule.   I plan to follow up either personally or through our NP within 2-4  month.   CC: I  will share my notes with PCP.  Electronically signed by: Melvyn Novas, MD 03/09/2021 3:56 PM  [facility] and [facility] Sleep Board certified by The [employer] of Sleep Medicine and Diplomate of the [employer] of Sleep Medicine. Board certified In Neurology through the ABPN, Fellow of the [employer] of Neurology. Medical Director of [employer].

## 2021-03-16 ENCOUNTER — Other Ambulatory Visit: Payer: Self-pay | Admitting: Nurse Practitioner

## 2021-03-16 DIAGNOSIS — F988 Other specified behavioral and emotional disorders with onset usually occurring in childhood and adolescence: Secondary | ICD-10-CM

## 2021-03-16 MED ORDER — AMPHETAMINE-DEXTROAMPHETAMINE 20 MG PO TABS: ORAL_TABLET | ORAL | 0 refills | Status: DC

## 2021-03-26 ENCOUNTER — Ambulatory Visit: Payer: BC Managed Care – PPO | Admitting: Nurse Practitioner

## 2021-03-29 NOTE — Progress Notes (Signed)
Future Appointments  Date Time Provider Department  03/30/2021  4:00 PM Lucky Cowboy, MD [facility]  09/13/2021  3:00 PM Lucky Cowboy, MD [facility]    History of Present Illness:       This very nice 40 y.o.MWM presents for 6  month follow up with HTN, HLD, Moderate Obesity ( BMI 34+), Pre-Diabetes /Insulin Resistance , Testosterone Deficiency, ADD  and Vitamin D Deficiency.  Patient is also being evaluated by Dr Vickey Huger for probable OSA.       Patient is followed expectantly for labile HTN & BP has been controlled at home. Today's BP: 136/86. Patient has had no complaints of any cardiac type chest pain, palpitations, dyspnea / orthopnea / PND, dizziness, claudication, or dependent edema.       Hyperlipidemia is controlled with diet & Atorvastatin. Patient denies myalgias or other med SE's. Last Lipids were at go0al except slight elevation of Trig's :  Lab Results  Component Value Date   CHOL 166 09/11/2020   HDL 44 09/11/2020   LDLCALC 95 09/11/2020   TRIG 167 (H) 09/11/2020   CHOLHDL 3.8 09/11/2020     Also, the patient has history of PreDiabetes and has had no symptoms of reactive hypoglycemia, diabetic polys, paresthesias or visual blurring.  Last A1c was normal & at goal :  Lab Results  Component Value Date   HGBA1C 5.5 09/11/2020                                                           Further, the patient also has history of Vitamin D Deficiency and supplements vitamin D without any suspected side-effects. Last vitamin D was at goal :   Lab Results  Component Value Date   VD25OH 82 09/11/2020     Current Outpatient Medications on File Prior to Visit  Medication Sig   albuterol  HFA  inhaler Inhale 2 puffs into the lungs every 6 (six) hours as needed for wheezing or shortness of breath.   ADDERALL 20 MG tablet 1/2 to 1 tablet 1 or 2 x /daily for ADD, should not take daily to avoid tolerance / addiction - script should last longer than 30 days    aspirin 81 MG tablet Take  daily.   atorvastatin ) 20 MG tablet Take  1 tablet  4 x /week   for Cholesterol   buPROPion-XL) 300 MG 2 Take  1 tablet  every Morning  for Focus & Concentration   VITAMIN D   10,000 Units  Take  daily.    citalopram  40 MG tablet TAKE 1 TABLET EVERY DAY   minoxidil (ROGAINE) 2 % external solution Apply topically 2 (two) times daily.   montelukast (SINGULAIR) 10 MG tablet Take  1 tablet  Daily  for Allergies   multivitamin  Take 1 tablet daily.     No Known Allergies   PMHx:   Past Medical History:  Diagnosis Date   ADD (attention deficit disorder)    Allergy    Anxiety    Asthma    Depression    Hyperlipidemia    Hypertension    Insulin resistance    history of normal A1C but insulin 60, 46   Vitamin D deficiency      Immunization History  Administered Date(s) Administered  Influenza Inj Mdck Quad With Preservative 02/09/2018, 02/14/2020   Influenza,inj,Quad PF,6+ Mos 02/14/2019   PPD Test 06/13/2013, 06/23/2014, 07/24/2015, 11/28/2016, 02/09/2018, 05/09/2019, 09/11/2020   Tdap 06/13/2013     Past Surgical History:  Procedure Laterality Date   ELBOW SURGERY Left 04/25/1989   s/p trauma   HERNIA REPAIR Right 04/25/2001   inguinal   OTHER SURGICAL HISTORY Right    thumb   TONSILLECTOMY AND ADENOIDECTOMY      FHx:    Reviewed / unchanged  SHx:    Reviewed / unchanged   Systems Review:  Constitutional: Denies fever, chills, wt changes, headaches, insomnia, fatigue, night sweats, change in appetite. Eyes: Denies redness, blurred vision, diplopia, discharge, itchy, watery eyes.  ENT: Denies discharge, congestion, post nasal drip, epistaxis, sore throat, earache, hearing loss, dental pain, tinnitus, vertigo, sinus pain, snoring.  CV: Denies chest pain, palpitations, irregular heartbeat, syncope, dyspnea, diaphoresis, orthopnea, PND, claudication or edema. Respiratory: denies cough, dyspnea, DOE, pleurisy, hoarseness, laryngitis,  wheezing.  Gastrointestinal: Denies dysphagia, odynophagia, heartburn, reflux, water brash, abdominal pain or cramps, nausea, vomiting, bloating, diarrhea, constipation, hematemesis, melena, hematochezia  or hemorrhoids. Genitourinary: Denies dysuria, frequency, urgency, nocturia, hesitancy, discharge, hematuria or flank pain. Musculoskeletal: Denies arthralgias, myalgias, stiffness, jt. swelling, pain, limping or strain/sprain.  Skin: Denies pruritus, rash, hives, warts, acne, eczema or change in skin lesion(s). Neuro: No weakness, tremor, incoordination, spasms, paresthesia or pain. Psychiatric: Denies confusion, memory loss or sensory loss. Endo: Denies change in weight, skin or hair change.  Heme/Lymph: No excessive bleeding, bruising or enlarged lymph nodes.  Physical Exam  BP 136/86   Pulse 93   Temp 97.9 F (36.6 C)   Resp 17   Ht 5\' 10"  (1.778 m)   Wt 231 lb 3.2 oz (104.9 kg)   SpO2 98%   BMI 33.17 kg/m   Appears  well nourished, well groomed  and in no distress.  Eyes: PERRLA, EOMs, conjunctiva no swelling or erythema. Sinuses: No frontal/maxillary tenderness ENT/Mouth: EAC's clear, TM's nl w/o erythema, bulging. Nares clear w/o erythema, swelling, exudates. Oropharynx clear without erythema or exudates. Oral hygiene is good. Tongue normal, non obstructing. Hearing intact.  Neck: Supple. Thyroid not palpable. Car 2+/2+ without bruits, nodes or JVD. Chest: Respirations nl with BS clear & equal w/o rales, rhonchi, wheezing or stridor.  Cor: Heart sounds normal w/ regular rate and rhythm without sig. murmurs, gallops, clicks or rubs. Peripheral pulses normal and equal  without edema.  Abdomen: Soft & bowel sounds normal. Non-tender w/o guarding, rebound, hernias, masses or organomegaly.  Lymphatics: Unremarkable.  Musculoskeletal: Full ROM all peripheral extremities, joint stability, 5/5 strength and normal gait.  Skin: Warm, dry without exposed rashes, lesions or ecchymosis  apparent.  Neuro: Cranial nerves intact, reflexes equal bilaterally. Sensory-motor testing grossly intact. Tendon reflexes grossly intact.  Pysch: Alert & oriented x 3.  Insight and judgement nl & appropriate. No ideations.  Assessment and Plan:  1. Labile hypertension  - Continue medication, monitor blood pressure at home.  - Continue DASH diet.  Reminder to go to the ER if any CP,  SOB, nausea, dizziness, severe HA, changes vision/speech.   - CBC with Differential/Platelet - COMPLETE METABOLIC PANEL WITH GFR - Magnesium - TSH  2. Hyperlipidemia, mixed  - Continue diet/meds, exercise,& lifestyle modifications.  - Continue monitor periodic cholesterol/liver & renal functions    - Lipid panel  3. Abnormal glucose  - Hemoglobin A1c - Insulin, random  4. Insulin resistance  - Continue diet, exercise  -  Lifestyle modifications.  - Monitor appropriate labs.   - Hemoglobin A1c - Insulin, random  5. Vitamin D deficiency  - Continue supplementation.   - VITAMIN D 25 Hydroxy   6. Attention deficit disorder (ADD) without hyperactivity   7. Testosterone deficiency  - TSH  8. Class 1 obesity due to excess calories with serious comorbidity   and body mass index (BMI) of 34.0 to 34.9 in adult  - TSH  9. Fatigue  - CBC with Differential/Platelet - TSH  10. Medication management  - CBC with Differential/Platelet - COMPLETE METABOLIC PANEL WITH GFR - Magnesium - Lipid panel - TSH - Hemoglobin A1c - Insulin, random - VITAMIN D 25 Hydroxy         Discussed  regular exercise, BP monitoring, weight control to achieve/maintain BMI less than 25 and discussed med and SE's. Recommended labs to assess and monitor clinical status with further disposition pending results of labs.  I discussed the assessment and treatment plan with the patient. The patient was provided an opportunity to ask questions and all were answered. The patient agreed with the plan and demonstrated  an understanding of the instructions.  I provided over 30 minutes of exam, counseling, chart review and  complex critical decision making.        The patient was advised to call back or seek an in-person evaluation if the symptoms worsen or if the condition fails to improve as anticipated.   Kirtland Bouchard, MD

## 2021-03-30 ENCOUNTER — Other Ambulatory Visit: Payer: Self-pay

## 2021-03-30 ENCOUNTER — Ambulatory Visit (INDEPENDENT_AMBULATORY_CARE_PROVIDER_SITE_OTHER): Payer: BC Managed Care – PPO | Admitting: Internal Medicine

## 2021-03-30 ENCOUNTER — Encounter: Payer: Self-pay | Admitting: Internal Medicine

## 2021-03-30 VITALS — BP 136/86 | HR 93 | Temp 97.9°F | Resp 17 | Ht 70.0 in | Wt 231.2 lb

## 2021-03-30 DIAGNOSIS — E559 Vitamin D deficiency, unspecified: Secondary | ICD-10-CM

## 2021-03-30 DIAGNOSIS — E782 Mixed hyperlipidemia: Secondary | ICD-10-CM

## 2021-03-30 DIAGNOSIS — R0989 Other specified symptoms and signs involving the circulatory and respiratory systems: Secondary | ICD-10-CM | POA: Diagnosis not present

## 2021-03-30 DIAGNOSIS — Z79899 Other long term (current) drug therapy: Secondary | ICD-10-CM | POA: Diagnosis not present

## 2021-03-30 DIAGNOSIS — F988 Other specified behavioral and emotional disorders with onset usually occurring in childhood and adolescence: Secondary | ICD-10-CM

## 2021-03-30 DIAGNOSIS — E88819 Insulin resistance, unspecified: Secondary | ICD-10-CM

## 2021-03-30 DIAGNOSIS — E8881 Metabolic syndrome: Secondary | ICD-10-CM

## 2021-03-30 DIAGNOSIS — R5383 Other fatigue: Secondary | ICD-10-CM

## 2021-03-30 DIAGNOSIS — E66811 Obesity, class 1: Secondary | ICD-10-CM

## 2021-03-30 DIAGNOSIS — Z6834 Body mass index (BMI) 34.0-34.9, adult: Secondary | ICD-10-CM

## 2021-03-30 DIAGNOSIS — E6609 Other obesity due to excess calories: Secondary | ICD-10-CM

## 2021-03-30 DIAGNOSIS — R7309 Other abnormal glucose: Secondary | ICD-10-CM

## 2021-03-30 DIAGNOSIS — E349 Endocrine disorder, unspecified: Secondary | ICD-10-CM

## 2021-03-30 NOTE — Patient Instructions (Signed)

## 2021-03-31 ENCOUNTER — Telehealth: Payer: Self-pay | Admitting: Neurology

## 2021-03-31 ENCOUNTER — Encounter: Payer: Self-pay | Admitting: Internal Medicine

## 2021-03-31 LAB — VITAMIN D 25 HYDROXY (VIT D DEFICIENCY, FRACTURES): Vit D, 25-Hydroxy: 86 ng/mL (ref 30–100)

## 2021-03-31 LAB — CBC WITH DIFFERENTIAL/PLATELET
Absolute Monocytes: 619 cells/uL (ref 200–950)
Basophils Absolute: 58 cells/uL (ref 0–200)
Basophils Relative: 0.8 %
Eosinophils Absolute: 346 cells/uL (ref 15–500)
Eosinophils Relative: 4.8 %
HCT: 42.9 % (ref 38.5–50.0)
Hemoglobin: 14.5 g/dL (ref 13.2–17.1)
Lymphs Abs: 2606 cells/uL (ref 850–3900)
MCH: 29.7 pg (ref 27.0–33.0)
MCHC: 33.8 g/dL (ref 32.0–36.0)
MCV: 87.9 fL (ref 80.0–100.0)
MPV: 10.4 fL (ref 7.5–12.5)
Monocytes Relative: 8.6 %
Neutro Abs: 3571 cells/uL (ref 1500–7800)
Neutrophils Relative %: 49.6 %
Platelets: 323 10*3/uL (ref 140–400)
RBC: 4.88 10*6/uL (ref 4.20–5.80)
RDW: 11.7 % (ref 11.0–15.0)
Total Lymphocyte: 36.2 %
WBC: 7.2 10*3/uL (ref 3.8–10.8)

## 2021-03-31 LAB — COMPLETE METABOLIC PANEL WITH GFR
AG Ratio: 1.8 (calc) (ref 1.0–2.5)
ALT: 39 U/L (ref 9–46)
AST: 32 U/L (ref 10–40)
Albumin: 5.1 g/dL (ref 3.6–5.1)
Alkaline phosphatase (APISO): 72 U/L (ref 36–130)
BUN: 14 mg/dL (ref 7–25)
CO2: 28 mmol/L (ref 20–32)
Calcium: 10.7 mg/dL — ABNORMAL HIGH (ref 8.6–10.3)
Chloride: 100 mmol/L (ref 98–110)
Creat: 0.93 mg/dL (ref 0.60–1.26)
Globulin: 2.8 g/dL (calc) (ref 1.9–3.7)
Glucose, Bld: 96 mg/dL (ref 65–99)
Potassium: 4.7 mmol/L (ref 3.5–5.3)
Sodium: 141 mmol/L (ref 135–146)
Total Bilirubin: 0.8 mg/dL (ref 0.2–1.2)
Total Protein: 7.9 g/dL (ref 6.1–8.1)
eGFR: 108 mL/min/{1.73_m2} (ref >=60)

## 2021-03-31 LAB — HEMOGLOBIN A1C
Hgb A1c MFr Bld: 5.5 % of total Hgb (ref <5.7)
Mean Plasma Glucose: 111 mg/dL
eAG (mmol/L): 6.2 mmol/L

## 2021-03-31 LAB — LIPID PANEL
Cholesterol: 175 mg/dL (ref <200)
HDL: 50 mg/dL (ref >=40)
LDL Cholesterol (Calc): 98 mg/dL (calc)
Non-HDL Cholesterol (Calc): 125 mg/dL (calc) (ref <130)
Total CHOL/HDL Ratio: 3.5 (calc) (ref <5.0)
Triglycerides: 172 mg/dL — ABNORMAL HIGH (ref <150)

## 2021-03-31 LAB — MAGNESIUM: Magnesium: 2.1 mg/dL (ref 1.5–2.5)

## 2021-03-31 LAB — INSULIN, RANDOM: Insulin: 15.9 u[IU]/mL

## 2021-03-31 LAB — TSH: TSH: 2.8 mIU/L (ref 0.40–4.50)

## 2021-03-31 NOTE — Telephone Encounter (Signed)
Returned call and LVM for pt to call me back to schedule sleep study ? ?

## 2021-04-01 NOTE — Progress Notes (Signed)
============================================================ -   Test results slightly outside the reference range are not unusual. If there is anything important, I will review this with you,  otherwise it is considered normal test values.  If you have further questions,  please do not hesitate to contact me at the office or via My Chart.  ============================================================ ============================================================  -  calcium is slightly elevated , So will continueto monitor closely. ============================================================ ============================================================  -  Total Chol= 175  - Excellet  !

## 2021-04-14 ENCOUNTER — Ambulatory Visit (INDEPENDENT_AMBULATORY_CARE_PROVIDER_SITE_OTHER): Payer: BC Managed Care – PPO | Admitting: Neurology

## 2021-04-14 DIAGNOSIS — G4733 Obstructive sleep apnea (adult) (pediatric): Secondary | ICD-10-CM | POA: Diagnosis not present

## 2021-04-14 DIAGNOSIS — Z9889 Other specified postprocedural states: Secondary | ICD-10-CM

## 2021-04-14 DIAGNOSIS — G4710 Hypersomnia, unspecified: Secondary | ICD-10-CM

## 2021-04-14 DIAGNOSIS — F988 Other specified behavioral and emotional disorders with onset usually occurring in childhood and adolescence: Secondary | ICD-10-CM

## 2021-04-14 DIAGNOSIS — E669 Obesity, unspecified: Secondary | ICD-10-CM

## 2021-04-14 DIAGNOSIS — R0683 Snoring: Secondary | ICD-10-CM

## 2021-04-14 DIAGNOSIS — E66811 Obesity, class 1: Secondary | ICD-10-CM

## 2021-04-16 ENCOUNTER — Other Ambulatory Visit: Payer: Self-pay | Admitting: Nurse Practitioner

## 2021-04-16 DIAGNOSIS — F988 Other specified behavioral and emotional disorders with onset usually occurring in childhood and adolescence: Secondary | ICD-10-CM

## 2021-04-16 MED ORDER — AMPHETAMINE-DEXTROAMPHETAMINE 20 MG PO TABS: ORAL_TABLET | ORAL | 0 refills | Status: DC

## 2021-04-20 NOTE — Progress Notes (Signed)
[facility]   HOME SLEEP TEST REPORT ( by Watch PAT)   STUDY DATE:  04-20-2021 (loaded data) DOB:  04-09-1961    ORDERING CLINICIAN:  Melvyn Novas, MD  REFERRING CLINICIAN: Dr. Oneta Rack   CLINICAL INFORMATION/HISTORY: Cole Lozano has been affected by sleep disordered breathing probably for much of his wife.  He is a strong family history mother and 2 maternal uncles had obstructive sleep apnea.  He underwent a UPPP at the tender age of 25 after being infected with mononucleosis and having breathing problems. UPPP/ No nasal surgery but deviated septum, repeated sinusitis, always congested.  Even today he has a small jaw rather crowded dentition and he cannot open the oral pathway very far.  So he relies on mouth breathing at night, as his nasal passageway is also congested and he has a slight notable septal deviation.  This seems to affect the left side of the nose.  Currently he is not just affected by (witnessed) apnea and snoring but he has also a very sleep unfriendly work schedule.      Epworth sleepiness score: 15/24.   BMI: 33kg/m   Neck Circumference: 18"   FINDINGS:   Sleep Summary:   Total Recording Time (hours, min): Total recording time amounted to 9 hours and 48 minutes of which 8 hours and 31 minutes was a total recorded sleep time with a REM sleep proportion of 23.8%.                           Respiratory Indices:   Calculated pAHI (per hour):    Overall AHI was 19/h with a strong REM dominance at 35.5/h versus non-REM sleep AHI of 13.9/h.                       Positional the patient slept only 8.5 minutes in supine position and a valid AHI could not be established in prone position AHI was 28.3, on the right and left side around 12/h.   Snoring statistics show an elevated volume of snoring at 45 dB mean volume.   65% of total sleep time were accompanied by snoring.  AHI:                                                   Oxygen Saturation  Statistics:   O2 Saturation Range (%):   Varied between a nadir at 85% a maximum of 99% with a mean volume of 95%.  Total time in hypoxemia was less than a minute and clinically irrelevant.       Pulse Rate Statistics: Heart rate recorded varied between 40 and 180 beats.   I believe the  upper range to be an artefact. True maximum heart rate was 110 bpm.                                      IMPRESSION:  This HST confirms the presence of moderate severe sleep apnea associated with snoring and with a REM sleep exacerbation.  No clinical significant hypoxia was noted.   RECOMMENDATION: Dental device or inspire therapy would not address a REM dominant sleep apnea.  However it could treat  normal REM sleep dependent apnea and that would reduce the overall apnea to a mild degree.  I would recommend CPAP therapy as the most effective for snoring and apnea. ° °Auto CPAP, 5-15 cm water, 2 cm EPR and heated humidification, mask of choice.  ° °  °INTERPRETING PHYSICIAN: ° ° Carmen Dohmeier, MD  ° °Medical Director of [facility].  ° °  °  °  °  °  °  °  °  °   °  ° ° ° °

## 2021-05-03 ENCOUNTER — Encounter: Payer: Self-pay | Admitting: Neurology

## 2021-05-03 ENCOUNTER — Telehealth: Payer: Self-pay | Admitting: Neurology

## 2021-05-03 DIAGNOSIS — R0683 Snoring: Secondary | ICD-10-CM | POA: Insufficient documentation

## 2021-05-03 DIAGNOSIS — G4710 Hypersomnia, unspecified: Secondary | ICD-10-CM | POA: Insufficient documentation

## 2021-05-03 DIAGNOSIS — Z9889 Other specified postprocedural states: Secondary | ICD-10-CM | POA: Insufficient documentation

## 2021-05-03 NOTE — Telephone Encounter (Signed)
-----   Message from Melvyn Novas, MD sent at 05/03/2021  8:43 AM EST ----- IMPRESSION:  This HST confirms the presence of moderate severe sleep apnea associated with snoring and with a REM sleep exacerbation.  No clinical significant hypoxia was noted.  RECOMMENDATION: Dental device or inspire therapy would not address a REM dominant sleep apnea.  However it could treat the normal REM sleep dependent apnea and that would reduce the overall apnea to a mild degree.  I would recommend CPAP therapy as the most effective for snoring and apnea.  Auto CPAP, 5-15 cm water, 2 cm EPR and heated humidification, mask of choice.    INTERPRETING PHYSICIAN:   Melvyn Novas, MD

## 2021-05-03 NOTE — Progress Notes (Signed)
IMPRESSION:  This HST confirms the presence of moderate severe sleep apnea associated with snoring and with a REM sleep exacerbation.  No clinical significant hypoxia was noted.  RECOMMENDATION: Dental device or inspire therapy would not address a REM dominant sleep apnea.  However it could treat the normal REM sleep dependent apnea and that would reduce the overall apnea to a mild degree.  I would recommend CPAP therapy as the most effective for snoring and apnea.  Auto CPAP, 5-15 cm water, 2 cm EPR and heated humidification, mask of choice.    INTERPRETING PHYSICIAN:   Melvyn Novas, MD

## 2021-05-03 NOTE — Telephone Encounter (Signed)
I called pt. I advised pt that Dr. Vickey Huger reviewed their sleep study results and found that pt has moderate to severe OSA. Dr. Vickey Huger recommends that pt start auto CPAP. I reviewed PAP compliance expectations with the pt. Pt is agreeable to starting a CPAP. I advised pt that an order will be sent to a DME, [facility], and [facility] will call the pt within about one week after they file with the pt's insurance. [facility] will show the pt how to use the machine, fit for masks, and troubleshoot the CPAP if needed. A follow up appt was made for insurance purposes with Dr. Vickey Huger on 08/16/2021 at 1:30 pm. Pt verbalized understanding to arrive 15 minutes early and bring their CPAP. A letter with all of this information in it will be mailed to the pt as a reminder. I verified with the pt that the address we have on file is correct. Pt verbalized understanding of results. Pt had no questions at this time but was encouraged to call back if questions arise. I have sent the order to [facility] and have received confirmation that they have received the order.  Pt did mention that his mom has a brand new CPAP that has not been used and he would like to use this. Advised to just let [facility] know. Pt verbalized understanding.

## 2021-05-03 NOTE — Procedures (Signed)
[facility] TEST REPORT ( by Watch PAT)   STUDY DATE:  04-20-2021 (loaded data) DOB:  04-09-1961    ORDERING CLINICIAN:  Larey Seat, MD  REFERRING CLINICIAN: Dr. Melford Aase   CLINICAL INFORMATION/HISTORY: Cole Lozano has been affected by sleep disordered breathing probably for much of his wife.  He is a strong family history mother and 2 maternal uncles had obstructive sleep apnea.  He underwent a UPPP at the tender age of 7 after being infected with mononucleosis and having breathing problems. UPPP/ No nasal surgery but deviated septum, repeated sinusitis, always congested.  Even today he has a small jaw rather crowded dentition and he cannot open the oral pathway very far.  So he relies on mouth breathing at night, as his nasal passageway is also congested and he has a slight notable septal deviation.  This seems to affect the left side of the nose.  Currently he is not just affected by (witnessed) apnea and snoring but he has also a very sleep unfriendly work schedule.      Epworth sleepiness score: 15/24.   BMI: 33kg/m   Neck Circumference: 18"   FINDINGS:   Sleep Summary:   Total Recording Time (hours, min): Total recording time amounted to 9 hours and 48 minutes of which 8 hours and 31 minutes was a total recorded sleep time with a REM sleep proportion of 23.8%.                           Respiratory Indices:   Calculated pAHI (per hour):    Overall AHI was 19/h with a strong REM dominance at 35.5/h versus non-REM sleep AHI of 13.9/h.                       Positional the patient slept only 8.5 minutes in supine position and a valid AHI could not be established in prone position AHI was 28.3, on the right and left side around 12/h.   Snoring statistics show an elevated volume of snoring at 45 dB mean volume.   65% of total sleep time were accompanied by snoring.  AHI:                                                   Oxygen Saturation  Statistics:   O2 Saturation Range (%):   Varied between a nadir at 85% a maximum of 99% with a mean volume of 95%.  Total time in hypoxemia was less than a minute and clinically irrelevant.       Pulse Rate Statistics: Heart rate recorded varied between 40 and 180 beats.   I believe the  upper range to be an artefact. True maximum heart rate was 110 bpm.                                      IMPRESSION:  This HST confirms the presence of moderate severe sleep apnea associated with snoring and with a REM sleep exacerbation.  No clinical significant hypoxia was noted.   RECOMMENDATION: Dental device or inspire therapy would not address a REM dominant sleep apnea.  However it could treat the  normal REM sleep dependent apnea and that would reduce the overall apnea to a mild degree.  I would recommend CPAP therapy as the most effective for snoring and apnea.  Auto CPAP, 5-15 cm water, 2 cm EPR and heated humidification, mask of choice.     INTERPRETING PHYSICIAN:   Larey Seat, MD   Medical Director of [facility] at [facility].

## 2021-05-03 NOTE — Addendum Note (Signed)
Addended by: Larey Seat on: 05/03/2021 08:43 AM   Modules accepted: Orders

## 2021-05-14 ENCOUNTER — Other Ambulatory Visit: Payer: Self-pay | Admitting: Nurse Practitioner

## 2021-05-14 DIAGNOSIS — G4733 Obstructive sleep apnea (adult) (pediatric): Secondary | ICD-10-CM | POA: Diagnosis not present

## 2021-05-14 DIAGNOSIS — F988 Other specified behavioral and emotional disorders with onset usually occurring in childhood and adolescence: Secondary | ICD-10-CM

## 2021-05-14 MED ORDER — AMPHETAMINE-DEXTROAMPHETAMINE 20 MG PO TABS: ORAL_TABLET | ORAL | 0 refills | Status: DC

## 2021-05-19 ENCOUNTER — Telehealth: Payer: Self-pay | Admitting: Neurology

## 2021-05-19 NOTE — Telephone Encounter (Signed)
His appointment in April needs to be moved up between the dates 2/21-4/20. Probably going to need to use a reschedule day. LVM for him to call us back to reschedule.

## 2021-06-11 ENCOUNTER — Other Ambulatory Visit: Payer: Self-pay | Admitting: Nurse Practitioner

## 2021-06-11 DIAGNOSIS — F988 Other specified behavioral and emotional disorders with onset usually occurring in childhood and adolescence: Secondary | ICD-10-CM

## 2021-06-14 MED ORDER — AMPHETAMINE-DEXTROAMPHETAMINE 20 MG PO TABS: ORAL_TABLET | ORAL | 0 refills | Status: DC

## 2021-06-16 ENCOUNTER — Telehealth: Payer: Self-pay

## 2021-06-16 NOTE — Telephone Encounter (Signed)
Called and no answer. LVM stating to call us back to schedule his initial CPAP f/u. Pt has an appt already that can be changed to fit the time window for his f/u. Pt needs to be seen between 05/26/21-08/12/21.

## 2021-07-12 ENCOUNTER — Other Ambulatory Visit: Payer: Self-pay

## 2021-07-12 ENCOUNTER — Other Ambulatory Visit: Payer: Self-pay | Admitting: Nurse Practitioner

## 2021-07-12 ENCOUNTER — Ambulatory Visit (INDEPENDENT_AMBULATORY_CARE_PROVIDER_SITE_OTHER): Payer: BC Managed Care – PPO | Admitting: Adult Health

## 2021-07-12 ENCOUNTER — Encounter: Payer: Self-pay | Admitting: Adult Health

## 2021-07-12 VITALS — BP 133/88 | HR 74 | Ht 70.0 in | Wt 240.0 lb

## 2021-07-12 DIAGNOSIS — Z9989 Dependence on other enabling machines and devices: Secondary | ICD-10-CM | POA: Diagnosis not present

## 2021-07-12 DIAGNOSIS — G4733 Obstructive sleep apnea (adult) (pediatric): Secondary | ICD-10-CM

## 2021-07-12 DIAGNOSIS — F988 Other specified behavioral and emotional disorders with onset usually occurring in childhood and adolescence: Secondary | ICD-10-CM

## 2021-07-12 MED ORDER — AMPHETAMINE-DEXTROAMPHETAMINE 20 MG PO TABS: ORAL_TABLET | ORAL | 0 refills | Status: DC

## 2021-07-12 NOTE — Progress Notes (Signed)
?[facility] ?[id number] ?[place]. [place] [address] ?[phone] ? ?     OFFICE FOLLOW UP NOTE ? ?Mr. Cole Lozano ?Date of Birth:  10-Apr-1981 ?Medical Record Number:  [id number]  ? ?Reason for visit: Initial CPAP follow-up ? ? ? ?SUBJECTIVE: ? ? ?CHIEF COMPLAINT:  ?Chief Complaint  ?Patient presents with  ? Obstructive Sleep Apnea  ?  Rm 3 alone ?Pt is well and stable, no concerns with CPAP. Sleep has improved   ? ? ?HPI:  ? ?Update 07/12/2021 JM: Patient returns for initial CPAP compliance visit.  Completed HST 04/14/2021 which showed moderate sleep apnea with overall AHI 19/h with strong REM dominance at 35.5/h.  Recommend initiating AutoPap which was started 05/14/2021.   ? ?Review of compliance report as below.  Reports tolerating CPAP well without any concerns.  Some nights he may not get more than a couple of hours of sleep due to work. Reports improvement of sleep, energy levels and day time fatigue. Reports improvement of snoring although does still have some occasional snoring which he contributes to his allergies. His compliance report does show high leak rate which he reports can happen when he turns on his side and will resolve after adjusting mask - this is not bothersome to him.  Epworth Sleepiness Scale 2/24 (prior to therapy 15/24).  ? ? ? ? ? ? ? ? ? ? ? ? ? ? ?Consult visit 03/09/2021 Dr. Vickey Huger: ? Cole Lozano  has a past medical history of ADD (attention deficit disorder), Allergy, Anxiety, Asthma, Depression, Hyperlipidemia, Hypertension, Insulin resistance, and Vitamin D deficiency.Marland Kitchen ?Chief concern according to patient :   Internal referral for witnessed sleep apnea. No prior sleep study. Snores all the time. Wife wakes him up telling him he stops breathing. Wakes up fatigued, never has energy. Mother has OSA. 2 uncles on Mothers side have OSA hx.  ?Twin bother has video taped him during vacation- and he was shocked to see and hear the extend of sleep breathing.  He cannot sleep supine  ?  ?  ?Sleep relevant medical history: witnessed apneas and loud snoring, can't breath in supine, 2 nocturia breaks, Sleep walking- strong history  in childhood, in college,  ?Tonsillectomy- adenoid and UPPP - at 41 years of age, following an infection by Epstein-Barr virus, mononucleosis.,  UPPP/ No nasal surgery but deviated septum, repeated sinusitis, always congested.  ?  ?  ?Social history:  Patient is working as  Consulting civil engineer( Orthoptist  and lives in a household with spouse and child  , one dog.. he works from 10 through 3 AM - project bound.  ?The patient currently works/ from home.  ?Tobacco use; none.   ?ETOH use : less than one drink a week. ,  ?Caffeine intake in form of Coffee( /) Soda( /) Tea ( /) but many 2-4  energy drinks a day. ?Regular exercise in form of walking. Golfing, baseball coaching .   ?  ?  ?Sleep habits are as follows: The patient's dinner time is between 6 PM. The patient goes to bed at 3-4 AM  PM and continues to sleep for  hours, wakes for 1-2 bathroom breaks. ?The preferred sleep position is non-supine, with the support of 1-2 pillows. Dreams are reportedly infrequent/vivid.  ? 6.45  AM is his alrm set but he often works until 3 AM - sleep deprived. He reports not feeling refreshed or restored in AM, with symptoms such as dry mouth, morning headaches, and residual fatigue.  ? ? ? ? ? ? ?  ROS:   ?14 system review of systems performed and negative with exception of those listed in HPI ? ?PMH:  ?Past Medical History:  ?Diagnosis Date  ? ADD (attention deficit disorder)   ? Allergy   ? Anxiety   ? Asthma   ? Depression   ? Hyperlipidemia   ? Hypertension   ? Insulin resistance   ? history of normal A1C but insulin 60, 45  ? Vitamin D deficiency   ? ? ?PSH:  ?Past Surgical History:  ?Procedure Laterality Date  ? ELBOW SURGERY Left 04/25/1989  ? s/p trauma  ? HERNIA REPAIR Right 04/25/2001  ? inguinal  ? OTHER SURGICAL HISTORY Right   ? thumb  ? TONSILLECTOMY AND  ADENOIDECTOMY    ? ? ?Social History:  ?Social History  ? ?Socioeconomic History  ? Marital status: Married  ?  Spouse name: Not on file  ? Number of children: Not on file  ? Years of education: Not on file  ? Highest education level: Not on file  ?Occupational History  ? Not on file  ?Tobacco Use  ? Smoking status: Never  ? Smokeless tobacco: Never  ?Substance and Sexual Activity  ? Alcohol use: Yes  ?  Alcohol/week: 1.0 standard drink  ?  Types: 1 drink(s) per week  ?  Comment: socially  ? Drug use: No  ? Sexual activity: Yes  ?Other Topics Concern  ? Not on file  ?Social History Narrative  ? Right handed  ? Caffeine use: energy drinks-about 2 per day  ? ?Social Determinants of Health  ? ?Financial Resource Strain: Not on file  ?Food Insecurity: Not on file  ?Transportation Needs: Not on file  ?Physical Activity: Not on file  ?Stress: Not on file  ?Social Connections: Not on file  ?Intimate Partner Violence: Not on file  ? ? ?Family History:  ?Family History  ?Problem Relation Age of Onset  ? Cancer Mother   ?     skin  ? Neurofibromatosis Brother   ? Diabetes Other   ? Stroke Other   ? ? ?Medications:   ?Current Outpatient Medications on File Prior to Visit  ?Medication Sig Dispense Refill  ? albuterol (PROVENTIL HFA;VENTOLIN HFA) 108 (90 Base) MCG/ACT inhaler Inhale 2 puffs into the lungs every 6 (six) hours as needed for wheezing or shortness of breath. 18 g 3  ? amphetamine-dextroamphetamine (ADDERALL) 20 MG tablet 1/2 to 1 tablet 1 or 2 x /daily for ADD, should not take daily to avoid tolerance / addiction - script should last longer than 30 days 60 tablet 0  ? aspirin 81 MG tablet Take 81 mg by mouth daily.    ? atorvastatin (LIPITOR) 20 MG tablet Take  1 tablet  4 x /week   for Cholesterol 52 tablet 3  ? buPROPion (WELLBUTRIN XL) 300 MG 24 hr tablet Take  1 tablet  every Morning  for Focus & Concentration 90 tablet 3  ? Cholecalciferol (VITAMIN D PO) Take 10,000 Units by mouth daily.     ? citalopram  (CELEXA) 40 MG tablet TAKE 1 TABLET BY MOUTH EVERY DAY 90 tablet 3  ? minoxidil (ROGAINE) 2 % external solution Apply topically 2 (two) times daily.    ? montelukast (SINGULAIR) 10 MG tablet Take  1 tablet  Daily  for Allergies 90 tablet 3  ? multivitamin (ONE-A-DAY MEN'S) TABS tablet Take 1 tablet by mouth daily.    ? ?No current facility-administered medications on file prior to visit.  ? ? ?  Allergies:  No Known Allergies ? ? ? ?OBJECTIVE: ? ?Physical Exam ? ?Vitals:  ? 07/12/21 1413  ?BP: 133/88  ?Pulse: 74  ?Weight: 240 lb (108.9 kg)  ?Height: 5\' 10"  (1.778 m)  ? ?Body mass index is 34.44 kg/m? ?No results found. ? ? ?General: well developed, well nourished, very pleasant middle-age Caucasian male, seated, in no evident distress ?Head: head normocephalic and atraumatic.   ?Neck: supple with no carotid or supraclavicular bruits ?Cardiovascular: regular rate and rhythm, no murmurs ?Musculoskeletal: no deformity ?Skin:  no rash/petichiae ?Vascular:  Normal pulses all extremities ?  ?Neurologic Exam ?Mental Status: Awake and fully alert. Oriented to place and time. Recent and remote memory intact. Attention span, concentration and fund of knowledge appropriate. Mood and affect appropriate.  ?Cranial Nerves: Pupils equal, briskly reactive to light. Extraocular movements full without nystagmus. Visual fields full to confrontation. Hearing intact. Facial sensation intact. Face, tongue, palate moves normally and symmetrically.  ?Motor: Normal bulk and tone. Normal strength in all tested extremity muscles ?Sensory.: intact to touch , pinprick , position and vibratory sensation.  ?Coordination: Rapid alternating movements normal in all extremities. Finger-to-nose and heel-to-shin performed accurately bilaterally. ?Gait and Station: Arises from chair without difficulty. Stance is normal. Gait demonstrates normal stride length and balance without use of AD. Tandem walk and heel toe without difficulty.  ?Reflexes: 1+ and  symmetric. Toes downgoing.  ? ? ? ? ? ? ? ?ASSESSMENT: Cole Lozano is a 41 y.o. 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  ? ? ? ? ?PLAN: ? ?OSA on CPAP : great compliance with [facility]

## 2021-07-15 ENCOUNTER — Other Ambulatory Visit: Payer: Self-pay | Admitting: Adult Health

## 2021-07-15 ENCOUNTER — Other Ambulatory Visit: Payer: Self-pay | Admitting: Internal Medicine

## 2021-07-16 ENCOUNTER — Encounter: Payer: Self-pay | Admitting: Internal Medicine

## 2021-08-09 ENCOUNTER — Other Ambulatory Visit: Payer: Self-pay | Admitting: Internal Medicine

## 2021-08-09 ENCOUNTER — Other Ambulatory Visit: Payer: Self-pay | Admitting: Nurse Practitioner

## 2021-08-09 DIAGNOSIS — F988 Other specified behavioral and emotional disorders with onset usually occurring in childhood and adolescence: Secondary | ICD-10-CM

## 2021-08-09 MED ORDER — AMPHETAMINE-DEXTROAMPHETAMINE 20 MG PO TABS: ORAL_TABLET | ORAL | 0 refills | Status: DC

## 2021-08-16 ENCOUNTER — Ambulatory Visit: Payer: BC Managed Care – PPO | Admitting: Neurology

## 2021-09-07 ENCOUNTER — Other Ambulatory Visit: Payer: Self-pay | Admitting: Internal Medicine

## 2021-09-07 DIAGNOSIS — F988 Other specified behavioral and emotional disorders with onset usually occurring in childhood and adolescence: Secondary | ICD-10-CM

## 2021-09-08 MED ORDER — AMPHETAMINE-DEXTROAMPHETAMINE 20 MG PO TABS: ORAL_TABLET | ORAL | 0 refills | Status: DC

## 2021-09-13 ENCOUNTER — Ambulatory Visit (INDEPENDENT_AMBULATORY_CARE_PROVIDER_SITE_OTHER): Payer: BC Managed Care – PPO | Admitting: Internal Medicine

## 2021-09-13 ENCOUNTER — Encounter: Payer: Self-pay | Admitting: Internal Medicine

## 2021-09-13 VITALS — BP 138/80 | HR 71 | Temp 97.9°F | Resp 16 | Ht 70.0 in | Wt 242.4 lb

## 2021-09-13 DIAGNOSIS — Z136 Encounter for screening for cardiovascular disorders: Secondary | ICD-10-CM

## 2021-09-13 DIAGNOSIS — Z1389 Encounter for screening for other disorder: Secondary | ICD-10-CM

## 2021-09-13 DIAGNOSIS — F900 Attention-deficit hyperactivity disorder, predominantly inattentive type: Secondary | ICD-10-CM

## 2021-09-13 DIAGNOSIS — Z0000 Encounter for general adult medical examination without abnormal findings: Secondary | ICD-10-CM

## 2021-09-13 DIAGNOSIS — G4710 Hypersomnia, unspecified: Secondary | ICD-10-CM

## 2021-09-13 DIAGNOSIS — E559 Vitamin D deficiency, unspecified: Secondary | ICD-10-CM | POA: Diagnosis not present

## 2021-09-13 DIAGNOSIS — Z130 Encounter for screening for diseases of the blood and blood-forming organs and certain disorders involving the immune mechanism: Secondary | ICD-10-CM

## 2021-09-13 DIAGNOSIS — Z1329 Encounter for screening for other suspected endocrine disorder: Secondary | ICD-10-CM

## 2021-09-13 DIAGNOSIS — R0989 Other specified symptoms and signs involving the circulatory and respiratory systems: Secondary | ICD-10-CM

## 2021-09-13 DIAGNOSIS — Z8249 Family history of ischemic heart disease and other diseases of the circulatory system: Secondary | ICD-10-CM

## 2021-09-13 DIAGNOSIS — N401 Enlarged prostate with lower urinary tract symptoms: Secondary | ICD-10-CM | POA: Diagnosis not present

## 2021-09-13 DIAGNOSIS — E782 Mixed hyperlipidemia: Secondary | ICD-10-CM

## 2021-09-13 DIAGNOSIS — Z131 Encounter for screening for diabetes mellitus: Secondary | ICD-10-CM | POA: Diagnosis not present

## 2021-09-13 DIAGNOSIS — Z13220 Encounter for screening for lipoid disorders: Secondary | ICD-10-CM

## 2021-09-13 DIAGNOSIS — J4520 Mild intermittent asthma, uncomplicated: Secondary | ICD-10-CM

## 2021-09-13 DIAGNOSIS — E349 Endocrine disorder, unspecified: Secondary | ICD-10-CM

## 2021-09-13 DIAGNOSIS — R350 Frequency of micturition: Secondary | ICD-10-CM | POA: Diagnosis not present

## 2021-09-13 DIAGNOSIS — E8881 Metabolic syndrome: Secondary | ICD-10-CM

## 2021-09-13 DIAGNOSIS — R5383 Other fatigue: Secondary | ICD-10-CM

## 2021-09-13 DIAGNOSIS — R7309 Other abnormal glucose: Secondary | ICD-10-CM

## 2021-09-13 DIAGNOSIS — Z0001 Encounter for general adult medical examination with abnormal findings: Secondary | ICD-10-CM

## 2021-09-13 DIAGNOSIS — Z111 Encounter for screening for respiratory tuberculosis: Secondary | ICD-10-CM | POA: Diagnosis not present

## 2021-09-13 DIAGNOSIS — Z79899 Other long term (current) drug therapy: Secondary | ICD-10-CM

## 2021-09-13 DIAGNOSIS — Z1211 Encounter for screening for malignant neoplasm of colon: Secondary | ICD-10-CM

## 2021-09-13 DIAGNOSIS — Z125 Encounter for screening for malignant neoplasm of prostate: Secondary | ICD-10-CM | POA: Diagnosis not present

## 2021-09-13 DIAGNOSIS — E6609 Other obesity due to excess calories: Secondary | ICD-10-CM

## 2021-09-13 MED ORDER — TRELEGY ELLIPTA 200-62.5-25 MCG/ACT IN AEPB: INHALATION_SPRAY | RESPIRATORY_TRACT | 11 refills | Status: AC

## 2021-09-13 NOTE — Patient Instructions (Signed)

## 2021-09-13 NOTE — Progress Notes (Signed)
Annual  Screening/Preventative Visit &  Comprehensive Evaluation & Examination  Future Appointments  Date Time Provider Department  09/13/2021  3:00 PM Lucky Cowboy, MD [facility]  01/17/2022  2:00 PM Ihor Austin, NP [facility]  09/14/2022  3:00 PM Lucky Cowboy, MD [facility]            This very nice 41 y.o. MWM presents for a Screening /Preventative Visit & comprehensive evaluation and management of multiple medical co-morbidities.  Patient has been followed for HTN, HLD, Prediabetes , Allergic Asthma and Vitamin D Deficiency. Patient reports occasional use of his rescue Albuterol MDI during certain allergy seasons of the year.  He is on citalopram for anxiety & Wellbutrin /Adderall for ADD with improved focus & concentration.        Patient is followed expectantly for labile HTN predates circa 2016. Patient's BP has been controlled and today's BP is at goal - 138/80. Patient denies any cardiac symptoms as chest pain, palpitations, shortness of breath, dizziness or ankle swelling.       [name] is controlled with diet and Atorvastatin. Patient denies myalgias or other medication SE's. Last lipids were  at goal :   Lab Results  Component Value Date   CHOL 175 03/30/2021   HDL 50 03/30/2021   LDLCALC 98 03/30/2021   TRIG 172 (H) 03/30/2021   CHOLHDL 3.5 03/30/2021        Patient has hx/o Moderate Obesity  (BMI 34.78) and prediabetes w/Insulin Resistance (A1c 5.2% /Insulin 94 /2014 and A1c 5.6% /Insulin 46 /2017).   Patient denies reactive hypoglycemic symptoms, visual blurring, diabetic polys or paresthesias. Last A1c was normal & at goal :   Lab Results  Component Value Date   HGBA1C 5.5 03/30/2021         Patient has hx/o low Testosterone levels ("232" and "227" in 2021), and after starting Zinc supplements, last year his Testosterone level returned 360 (Normal >250) .       Finally, patient has history of Vitamin D Deficiency and last vitamin D was    Lab Results  Component Value Date   VD25OH 22 03/30/2021     Current Outpatient Medications on File Prior to Visit  Medication Sig   albuterol  HFA inhaler Inhale 2 puffs  every 6  hours as needed for wheezing o   ADDERALL 20 MG tablet 1/2 to 1 tablet 1 or 2 x /daily for ADD, Focus & Concentration    aspirin 81 MG tablet Take daily.   atorvastatin  20 MG tablet Take  1 tablet  4 x /week   for Cholesterol   buPROPion  XL 300 MG 24 hr tablet Take  1 tablet  every Morning  for Focus & Concentration   VITAMIN D 10,000 Units  Take  daily.    citalopram 40 MG tablet TAKE 1 TABLET EVERY DAY   minoxidil (ROGAINE) 2 % ext soln Apply topically 2 times daily.   montelukast 10 MG  TAKE 1 TABLET  DAILY FOR ALLERGIES   multivitamin  Take 1 tablet  daily.   Zinc  50 mg  Take 1 tablet Daily     Past Medical History:  Diagnosis Date   ADD (attention deficit disorder)    Allergy    Anxiety    Asthma    Depression    Hyperlipidemia    Hypertension    Insulin resistance    history of normal A1C but insulin 60, 45   Vitamin D deficiency  Health Maintenance  Topic Date Due   COVID-19 Vaccine (1) Never done   INFLUENZA VACCINE  11/23/2021   TETANUS/TDAP  06/14/2023   Hepatitis C Screening  Completed   HIV Screening  Completed   HPV VACCINES  Aged Out    Immunization History  Administered Date(s) Administered   Influenza Inj Mdck Quad  02/09/2018, 02/14/2020   Influenza,inj,Quad  02/14/2019   PPD Test 11/28/2016, 02/09/2018, 05/09/2019, 09/11/2020   Tdap 06/13/2013    Past Surgical History:  Procedure Laterality Date   ELBOW SURGERY Left 04/25/1989   s/p trauma   HERNIA REPAIR Right 04/25/2001   inguinal   OTHER SURGICAL HISTORY Right    thumb   TONSILLECTOMY AND ADENOIDECTOMY       Family History  Problem Relation Age of Onset   Cancer Mother        skin   Neurofibromatosis Brother    Diabetes Other    Stroke Other      Social History   Tobacco Use    Smoking status: Never   Smokeless tobacco: Never  Substance Use Topics   Alcohol use: Yes    Alcohol/week: 1.0 standard drink    Types: 1 drink(s) per week    Comment: socially   Drug use: No      ROS Constitutional: Denies fever, chills, weight loss/gain, headaches, insomnia,  night sweats or change in appetite. Does c/o fatigue. Eyes: Denies redness, blurred vision, diplopia, discharge, itchy or watery eyes.  ENT: Denies discharge, congestion, post nasal drip, epistaxis, sore throat, earache, hearing loss, dental pain, Tinnitus, Vertigo, Sinus pain or snoring.  Cardio: Denies chest pain, palpitations, irregular heartbeat, syncope, dyspnea, diaphoresis, orthopnea, PND, claudication or edema Respiratory: denies cough, dyspnea, DOE, pleurisy, hoarseness, laryngitis or wheezing.  Gastrointestinal: Denies dysphagia, heartburn, reflux, water brash, pain, cramps, nausea, vomiting, bloating, diarrhea, constipation, hematemesis, melena, hematochezia, jaundice or hemorrhoids Genitourinary: Denies dysuria, frequency, urgency, nocturia, hesitancy, discharge, hematuria or flank pain Musculoskeletal: Denies arthralgia, myalgia, stiffness, Jt. Swelling, pain, limp or strain/sprain. Denies Falls. Skin: Denies puritis, rash, hives, warts, acne, eczema or change in skin lesion Neuro: No weakness, tremor, incoordination, spasms, paresthesia or pain Psychiatric: Denies confusion, memory loss or sensory loss. Denies Depression. Endocrine: Denies change in weight, skin, hair change, nocturia, and paresthesia, diabetic polys, visual blurring or hyper / hypo glycemic episodes.  Heme/Lymph: No excessive bleeding, bruising or enlarged lymph nodes.   Physical Exam  BP 138/80   Pulse 71   Temp 97.9 F (36.6 C)   Resp 16   Ht 5\' 10"  (1.778 m)   Wt 242 lb 6.4 oz (110 kg)   SpO2 96%   BMI 34.78 kg/m   General Appearance: Well nourished and well groomed and in no apparent distress.  Eyes: PERRLA, EOMs,  conjunctiva no swelling or erythema, normal fundi and vessels. Sinuses: No frontal/maxillary tenderness ENT/Mouth: EACs patent / TMs  nl. Nares clear without erythema, swelling, mucoid exudates. Oral hygiene is good. No erythema, swelling, or exudate. Tongue normal, non-obstructing. Tonsils not swollen or erythematous. Hearing normal.  Neck: Supple, thyroid not palpable. No bruits, nodes or JVD. Respiratory: Respiratory effort normal.  BS equal and clear bilateral without rales, rhonci, wheezing or stridor. Cardio: Heart sounds are normal with regular rate and rhythm and no murmurs, rubs or gallops. Peripheral pulses are normal and equal bilaterally without edema. No aortic or femoral bruits. Chest: symmetric with normal excursions and percussion.  Abdomen: Soft, with Nl bowel sounds. Nontender, no guarding, rebound, hernias, masses,  or organomegaly.  Lymphatics: Non tender without lymphadenopathy.  Musculoskeletal: Full ROM all peripheral extremities, joint stability, 5/5 strength, and normal gait. Skin: Warm and dry without rashes, lesions, cyanosis, clubbing or  ecchymosis.  Neuro: Cranial nerves intact, reflexes equal bilaterally. Normal muscle tone, no cerebellar symptoms. Sensation intact.  Pysch: Alert and oriented X 3 with normal affect, insight and judgment appropriate.   Assessment and Plan  1. Annual Preventative/Screening Exam    2. Attention deficit hyperactivity disorder (ADHD), predominantly inattentive type   3. Labile hypertension  - EKG 12-Lead - Urinalysis, Routine w reflex microscopic - Microalbumin / creatinine urine ratio - CBC with Differential/Platelet - COMPLETE METABOLIC PANEL WITH GFR - Magnesium - TSH  4. Hyperlipidemia, mixed  - EKG 12-Lead - Lipid panel - TSH  5. Abnormal glucose  - EKG 12-Lead - Hemoglobin A1c - Insulin, random  6. Vitamin D deficiency  - VITAMIN D 25 Hydroxy   7. Insulin resistance  - Hemoglobin A1c - Insulin,  random  8. Testosterone deficiency  - Testosterone  9. Class 1 obesity due to excess calories with serious comorbidity  and body mass index (BMI) of 34.0 to 34.9 in adult   10. Prostate cancer screening - PSA  11. Screening for colorectal cancer  - Cologuard  12. Screening for heart disease  - EKG 12-Lead  13. Family history of cerebrovascular event  - EKG 12-Lead  14. Screening examination for pulmonary tuberculosis  - TB Skin Test  15. Hypersomnia with sleep apnea  - EKG 12-Lead  16. Fatigue, unspecified type  - Iron, Total/Total Iron Binding Cap - Vitamin B12 - Testosterone - CBC with Differential/Platelet - TSH  17. Medication management  - Urinalysis, Routine w reflex microscopic - Microalbumin / creatinine urine ratio - CBC with Differential/Platelet - COMPLETE METABOLIC PANEL WITH GFR - Magnesium - Lipid panel - TSH - Hemoglobin A1c - Insulin, random - VITAMIN D 25 Hydroxy           Patient was counseled in prudent diet, weight control to achieve/maintain BMI less than 25, BP monitoring, regular exercise and medications as discussed.  Discussed med effects and SE's.  Given Sx's & Coupon card for Trelegy  Inhaler. Routine screening labs and tests as requested with regular follow-up as recommended. Over 40 minutes of exam, counseling, chart review and high complex critical decision making was performed   Kirtland Bouchard, MD

## 2021-09-14 LAB — COMPLETE METABOLIC PANEL WITH GFR
AG Ratio: 1.7 (calc) (ref 1.0–2.5)
ALT: 32 U/L (ref 9–46)
AST: 21 U/L (ref 10–40)
Albumin: 4.6 g/dL (ref 3.6–5.1)
Alkaline phosphatase (APISO): 68 U/L (ref 36–130)
BUN: 11 mg/dL (ref 7–25)
CO2: 32 mmol/L (ref 20–32)
Calcium: 9.6 mg/dL (ref 8.6–10.3)
Chloride: 105 mmol/L (ref 98–110)
Creat: 0.78 mg/dL (ref 0.60–1.26)
Globulin: 2.7 g/dL (calc) (ref 1.9–3.7)
Glucose, Bld: 98 mg/dL (ref 65–99)
Potassium: 3.9 mmol/L (ref 3.5–5.3)
Sodium: 143 mmol/L (ref 135–146)
Total Bilirubin: 0.5 mg/dL (ref 0.2–1.2)
Total Protein: 7.3 g/dL (ref 6.1–8.1)
eGFR: 116 mL/min/{1.73_m2} (ref >=60)

## 2021-09-14 LAB — CBC WITH DIFFERENTIAL/PLATELET
Absolute Monocytes: 463 cells/uL (ref 200–950)
Basophils Absolute: 52 cells/uL (ref 0–200)
Basophils Relative: 1 %
Eosinophils Absolute: 374 cells/uL (ref 15–500)
Eosinophils Relative: 7.2 %
HCT: 41.3 % (ref 38.5–50.0)
Hemoglobin: 13.5 g/dL (ref 13.2–17.1)
Lymphs Abs: 2054 cells/uL (ref 850–3900)
MCH: 28.7 pg (ref 27.0–33.0)
MCHC: 32.7 g/dL (ref 32.0–36.0)
MCV: 87.9 fL (ref 80.0–100.0)
MPV: 10.9 fL (ref 7.5–12.5)
Monocytes Relative: 8.9 %
Neutro Abs: 2257 cells/uL (ref 1500–7800)
Neutrophils Relative %: 43.4 %
Platelets: 266 10*3/uL (ref 140–400)
RBC: 4.7 10*6/uL (ref 4.20–5.80)
RDW: 12.1 % (ref 11.0–15.0)
Total Lymphocyte: 39.5 %
WBC: 5.2 10*3/uL (ref 3.8–10.8)

## 2021-09-14 LAB — URINALYSIS, ROUTINE W REFLEX MICROSCOPIC
Bilirubin Urine: NEGATIVE
Glucose, UA: NEGATIVE
Hgb urine dipstick: NEGATIVE
Ketones, ur: NEGATIVE
Leukocytes,Ua: NEGATIVE
Nitrite: NEGATIVE
Protein, ur: NEGATIVE
Specific Gravity, Urine: 1.019 (ref 1.001–1.035)
pH: 7.5 (ref 5.0–8.0)

## 2021-09-14 LAB — VITAMIN D 25 HYDROXY (VIT D DEFICIENCY, FRACTURES): Vit D, 25-Hydroxy: 102 ng/mL — ABNORMAL HIGH (ref 30–100)

## 2021-09-14 LAB — LIPID PANEL
Cholesterol: 126 mg/dL (ref <200)
HDL: 41 mg/dL (ref >=40)
LDL Cholesterol (Calc): 66 mg/dL (calc)
Non-HDL Cholesterol (Calc): 85 mg/dL (calc) (ref <130)
Total CHOL/HDL Ratio: 3.1 (calc) (ref <5.0)
Triglycerides: 108 mg/dL (ref <150)

## 2021-09-14 LAB — IRON, TOTAL/TOTAL IRON BINDING CAP
%SAT: 34 % (calc) (ref 20–48)
Iron: 119 ug/dL (ref 50–180)
TIBC: 351 mcg/dL (calc) (ref 250–425)

## 2021-09-14 LAB — PSA: PSA: 0.24 ng/mL (ref <=4.00)

## 2021-09-14 LAB — TESTOSTERONE: Testosterone: 338 ng/dL (ref 250–827)

## 2021-09-14 LAB — HEMOGLOBIN A1C
Hgb A1c MFr Bld: 5.4 % of total Hgb (ref <5.7)
Mean Plasma Glucose: 108 mg/dL
eAG (mmol/L): 6 mmol/L

## 2021-09-14 LAB — INSULIN, RANDOM: Insulin: 86 u[IU]/mL — ABNORMAL HIGH

## 2021-09-14 LAB — TSH: TSH: 1.93 mIU/L (ref 0.40–4.50)

## 2021-09-14 LAB — MAGNESIUM: Magnesium: 2 mg/dL (ref 1.5–2.5)

## 2021-09-14 LAB — MICROALBUMIN / CREATININE URINE RATIO
Creatinine, Urine: 102 mg/dL (ref 20–320)
Microalb, Ur: <0.2 mg/dL

## 2021-09-14 LAB — VITAMIN B12: Vitamin B-12: 799 pg/mL (ref 200–1100)

## 2021-09-14 NOTE — Progress Notes (Signed)
<><><><><><><><><><><><><><><><><><><><><><><><><><><><><><><><><> <><><><><><><><><><><><><><><><><><><><><><><><><><><><><><><><><> -   Test results slightly outside the reference range are not unusual. If there is anything important, I will review this with you,  otherwise it is considered normal test values.  If you have further questions,  please do not hesitate to contact me at the office or via My Chart.  <><><><><><><><><><><><><><><><><><><><><><><><><><><><><><><><><> <><><><><><><><><><><><><><><><><><><><><><><><><><><><><><><><><>  -  Vitamin D = 102   - Excellent  - Please keep dose same  <><><><><><><><><><><><><><><><><><><><><><><><><><><><><><><><><>  -  Iron & Vitamin B12  levels both Normal  & OK  <><><><><><><><><><><><><><><><><><><><><><><><><><><><><><><><><>  -  PSA - Low  - Great <><><><><><><><><><><><><><><><><><><><><><><><><><><><><><><><><>  -       - Testosterone -Normal  <><><><><><><><><><><><><><><><><><><><><><><><><><><><><><><><><>  -  Total Chol = 126   & LDL Chol    = 66       Both  Excellent   - Very low risk for Heart Attack  / Stroke <><><><><><><><><><><><><><><><><><><><><><><><><><><><><><><><><>  -  A1c - Normal - No Diabetes  - Great  ! <><><><><><><><><><><><><><><><><><><><><><><><><><><><><><><><><>  -  All Else - CBC - Kidneys - Electrolytes - Liver - Magnesium & Thyroid    - all  Normal / OK <><><><><><><><><><><><><><><><><><><><><><><><><><><><><><><><><>  - Keep up the Great Work  !  !   <><><><><><><><><><><><><><><><><><><><><><><><><><><><><><><><><>

## 2021-10-07 ENCOUNTER — Other Ambulatory Visit: Payer: Self-pay | Admitting: Nurse Practitioner

## 2021-10-07 DIAGNOSIS — F988 Other specified behavioral and emotional disorders with onset usually occurring in childhood and adolescence: Secondary | ICD-10-CM

## 2021-10-09 MED ORDER — AMPHETAMINE-DEXTROAMPHETAMINE 20 MG PO TABS: ORAL_TABLET | ORAL | 0 refills | Status: DC

## 2021-11-15 ENCOUNTER — Other Ambulatory Visit: Payer: Self-pay | Admitting: Internal Medicine

## 2021-11-15 DIAGNOSIS — F988 Other specified behavioral and emotional disorders with onset usually occurring in childhood and adolescence: Secondary | ICD-10-CM

## 2021-11-15 MED ORDER — AMPHETAMINE-DEXTROAMPHETAMINE 20 MG PO TABS: ORAL_TABLET | ORAL | 0 refills | Status: DC

## 2021-12-14 ENCOUNTER — Other Ambulatory Visit: Payer: Self-pay | Admitting: Nurse Practitioner

## 2021-12-14 DIAGNOSIS — F988 Other specified behavioral and emotional disorders with onset usually occurring in childhood and adolescence: Secondary | ICD-10-CM

## 2021-12-15 MED ORDER — AMPHETAMINE-DEXTROAMPHETAMINE 20 MG PO TABS: ORAL_TABLET | ORAL | 0 refills | Status: DC

## 2021-12-16 ENCOUNTER — Other Ambulatory Visit: Payer: Self-pay | Admitting: Internal Medicine

## 2021-12-16 DIAGNOSIS — F988 Other specified behavioral and emotional disorders with onset usually occurring in childhood and adolescence: Secondary | ICD-10-CM

## 2021-12-16 MED ORDER — AMPHETAMINE-DEXTROAMPHETAMINE 20 MG PO TABS: ORAL_TABLET | ORAL | 0 refills | Status: DC

## 2021-12-20 ENCOUNTER — Other Ambulatory Visit: Payer: Self-pay | Admitting: Nurse Practitioner

## 2021-12-20 DIAGNOSIS — F988 Other specified behavioral and emotional disorders with onset usually occurring in childhood and adolescence: Secondary | ICD-10-CM

## 2021-12-20 MED ORDER — AMPHETAMINE-DEXTROAMPHETAMINE 20 MG PO TABS: ORAL_TABLET | ORAL | 0 refills | Status: DC

## 2021-12-20 NOTE — Progress Notes (Signed)
PDMP is reviewed and appropriate  

## 2021-12-21 ENCOUNTER — Other Ambulatory Visit: Payer: Self-pay | Admitting: Internal Medicine

## 2021-12-21 DIAGNOSIS — F988 Other specified behavioral and emotional disorders with onset usually occurring in childhood and adolescence: Secondary | ICD-10-CM

## 2021-12-21 MED ORDER — AMPHETAMINE-DEXTROAMPHETAMINE 30 MG PO TABS: ORAL_TABLET | ORAL | 0 refills | Status: DC

## 2022-01-17 ENCOUNTER — Telehealth: Payer: BC Managed Care – PPO | Admitting: Adult Health

## 2022-01-26 ENCOUNTER — Other Ambulatory Visit: Payer: Self-pay | Admitting: Internal Medicine

## 2022-01-26 ENCOUNTER — Other Ambulatory Visit: Payer: Self-pay | Admitting: Nurse Practitioner

## 2022-01-26 DIAGNOSIS — F988 Other specified behavioral and emotional disorders with onset usually occurring in childhood and adolescence: Secondary | ICD-10-CM

## 2022-01-26 MED ORDER — AMPHETAMINE-DEXTROAMPHETAMINE 20 MG PO TABS: ORAL_TABLET | ORAL | 0 refills | Status: DC

## 2022-02-01 ENCOUNTER — Other Ambulatory Visit: Payer: Self-pay | Admitting: Internal Medicine

## 2022-02-01 DIAGNOSIS — F988 Other specified behavioral and emotional disorders with onset usually occurring in childhood and adolescence: Secondary | ICD-10-CM

## 2022-02-01 MED ORDER — AMPHETAMINE-DEXTROAMPHETAMINE 10 MG PO TABS: ORAL_TABLET | ORAL | 0 refills | Status: DC

## 2022-02-02 ENCOUNTER — Telehealth: Payer: Self-pay

## 2022-02-02 NOTE — Telephone Encounter (Signed)
The patient's Adderall 10 mg tablets are approved through his insurance 02/02/22 to 02/01/25. The patient is aware.

## 2022-03-11 ENCOUNTER — Ambulatory Visit (INDEPENDENT_AMBULATORY_CARE_PROVIDER_SITE_OTHER): Payer: BC Managed Care – PPO | Admitting: Nurse Practitioner

## 2022-03-11 ENCOUNTER — Encounter: Payer: Self-pay | Admitting: Nurse Practitioner

## 2022-03-11 VITALS — BP 122/78 | HR 67 | Temp 97.5°F | Ht 70.0 in | Wt 202.0 lb

## 2022-03-11 DIAGNOSIS — J4520 Mild intermittent asthma, uncomplicated: Secondary | ICD-10-CM

## 2022-03-11 DIAGNOSIS — E349 Endocrine disorder, unspecified: Secondary | ICD-10-CM

## 2022-03-11 DIAGNOSIS — R0989 Other specified symptoms and signs involving the circulatory and respiratory systems: Secondary | ICD-10-CM

## 2022-03-11 DIAGNOSIS — E88819 Insulin resistance, unspecified: Secondary | ICD-10-CM | POA: Diagnosis not present

## 2022-03-11 DIAGNOSIS — R0683 Snoring: Secondary | ICD-10-CM

## 2022-03-11 DIAGNOSIS — E782 Mixed hyperlipidemia: Secondary | ICD-10-CM

## 2022-03-11 DIAGNOSIS — E559 Vitamin D deficiency, unspecified: Secondary | ICD-10-CM | POA: Diagnosis not present

## 2022-03-11 DIAGNOSIS — Z79899 Other long term (current) drug therapy: Secondary | ICD-10-CM

## 2022-03-11 DIAGNOSIS — Z9889 Other specified postprocedural states: Secondary | ICD-10-CM

## 2022-03-11 DIAGNOSIS — Z6828 Body mass index (BMI) 28.0-28.9, adult: Secondary | ICD-10-CM

## 2022-03-11 DIAGNOSIS — F900 Attention-deficit hyperactivity disorder, predominantly inattentive type: Secondary | ICD-10-CM

## 2022-03-11 DIAGNOSIS — F324 Major depressive disorder, single episode, in partial remission: Secondary | ICD-10-CM

## 2022-03-11 DIAGNOSIS — K760 Fatty (change of) liver, not elsewhere classified: Secondary | ICD-10-CM | POA: Diagnosis not present

## 2022-03-11 DIAGNOSIS — Z23 Encounter for immunization: Secondary | ICD-10-CM

## 2022-03-11 DIAGNOSIS — G4730 Sleep apnea, unspecified: Secondary | ICD-10-CM

## 2022-03-11 DIAGNOSIS — G4710 Hypersomnia, unspecified: Secondary | ICD-10-CM

## 2022-03-11 NOTE — Patient Instructions (Signed)

## 2022-03-11 NOTE — Progress Notes (Signed)
FOLLOW UP  Assessment and Plan:   1. Labile hypertension Discussed DASH (Dietary Approaches to Stop Hypertension) DASH diet is lower in sodium than a typical American diet. Cut back on foods that are high in saturated fat, cholesterol, and trans fats. Eat more whole-grain foods, fish, poultry, and nuts Remain active and exercise as tolerated daily.  Monitor BP at home-Call if greater than 130/80.  Check CMP/CBC   2. Mild intermittent extrinsic asthma without complication No longer on Trelegy Continue Ventolin-contact office if use increases to >2 x week Avoid triggers  3. Hepatic steatosis with liver cyst Recent weight loss through diet and exercise. Continue to monitor LFTs  4. Insulin resistance Monitor Insulin levels  5. Major depressive disorder in partial remission, unspecified whether recurrent ([place]) Well managed No longer on Wellbutrin Continue Celexa  6. Vitamin D deficiency Continue supplement Monitor levels  7. Attention deficit hyperactivity disorder (ADHD), predominantly inattentive type Medications providing over 30% improvement of attention deficit hyperactivity disorder. No change in management. Using lowest effective dose. Discussed alternative pharmacological and non-pharmacological therapies. No suspected aberrant drug-taking behaviors. Continue to monitor   8. Hyperlipidemia, mixed Discussed lifestyle modifications. Recommended diet heavy in fruits and veggies, omega 3's. Decrease consumption of animal meats, cheeses, and dairy products. Remain active and exercise as tolerated. Continue to monitor. Check lipids/TSH  9. Testosterone deficiency Monitor levels Continue Zinc  10. BMI 28 Discussed appropriate BMI Diet modification. Physical activity. Encouraged/praised to build confidence.  11. Hypersomnia with sleep apnea/loud snoring/s/p UPPP Improved with weight loss No longer using  Continue to monitor  12. Need for influenza  vaccination Administered Patient tolerated well  13.  Medicaiton managemnt All medications discussed and reviewed in full. All questions and concerns regarding medications addressed.     Orders Placed This Encounter  Procedures   Flu Vaccine QUAD 36+ mos IM (Fluarix, Fluzone & Afluria Quad PF   CBC with Differential/Platelet   COMPLETE METABOLIC PANEL WITH GFR   Lipid panel   VITAMIN D 25 Hydroxy (Vit-D Deficiency, Fractures)   Testosterone    Continue diet and meds as discussed. Further disposition pending results of labs. Discussed med's effects and SE's.    Over 30 minutes of exam, counseling, chart review, and critical decision making was performed.   Future Appointments  Date Time Provider [place]  09/14/2022  3:00 PM Unk Pinto, MD [facility] None    ----------------------------------------------------------------------------------------------------------------------  HPI 41 y.o. male  presents for 3 month follow up on hypertension, cholesterol, diabetes, weight and vitamin D deficiency.   Overall he reports doing well.  He has no new or additional concerns.    He has continued to work on weight loss via lifestyle modifications.    BMI is Body mass index is 28.98 kg/m., he has been working on diet and exercise. Wt Readings from Last 3 Encounters:  03/11/22 202 lb (91.6 kg)  09/13/21 242 lb 6.4 oz (110 kg)  07/12/21 240 lb (108.9 kg)    His blood pressure has been controlled at home, today their BP is BP: 122/78  He does workout. He denies chest pain, shortness of breath, dizziness.   He is not on cholesterol medication. His cholesterol is at goal. The cholesterol last visit was:   Lab Results  Component Value Date   CHOL 126 09/13/2021   HDL 41 09/13/2021   LDLCALC 66 09/13/2021   TRIG 108 09/13/2021   CHOLHDL 3.1 09/13/2021    He has been working on diet and exercise for  prediabetes, and denies polydipsia and polyuria. Last A1C in the  office was:  Lab Results  Component Value Date   HGBA1C 5.4 09/13/2021   Patient is on Vitamin D supplement.   Lab Results  Component Value Date   VD25OH 102 (H) 09/13/2021        Current Medications:  Current Outpatient Medications on File Prior to Visit  Medication Sig   albuterol (PROVENTIL HFA;VENTOLIN HFA) 108 (90 Base) MCG/ACT inhaler Inhale 2 puffs into the lungs every 6 (six) hours as needed for wheezing or shortness of breath.   amphetamine-dextroamphetamine (ADDERALL) 10 MG tablet 1 to 2 tablets 1 or 2 x /daily for ADD, should not take daily to avoid tolerance / addiction - script should last longer than 30 days   aspirin 81 MG tablet Take 81 mg by mouth daily.   atorvastatin (LIPITOR) 20 MG tablet Take  1 tablet  4 x /week   for Cholesterol (Patient taking differently: Take 10 mg by mouth daily. Take  1 tablet  4 x /week   for Cholesterol)   Cholecalciferol (VITAMIN D PO) Take 10,000 Units by mouth daily.    citalopram (CELEXA) 40 MG tablet TAKE 1 TABLET BY MOUTH EVERY DAY   Fluticasone-Umeclidin-Vilant (TRELEGY ELLIPTA) 200-62.5-25 MCG/ACT AEPB Use  1 Inhalation  Daily  during Allergy Seasons.   minoxidil (ROGAINE) 2 % external solution Apply topically 2 (two) times daily.   montelukast (SINGULAIR) 10 MG tablet TAKE 1 TABLET BY MOUTH DAILY FOR ALLERGIES   multivitamin (ONE-A-DAY MEN'S) TABS tablet Take 1 tablet by mouth daily.   buPROPion (WELLBUTRIN XL) 300 MG 24 hr tablet Take  1 tablet  every Morning  for Focus & Concentration (Patient not taking: Reported on 03/11/2022)   No current facility-administered medications on file prior to visit.     Allergies: No Known Allergies   Medical History:  Past Medical History:  Diagnosis Date   ADD (attention deficit disorder)    Allergy    Anxiety    Asthma    Depression    Hyperlipidemia    Hypertension    Insulin resistance    history of normal A1C but insulin 60, 45   Vitamin D deficiency    Family history-  Reviewed and unchanged Social history- Reviewed and unchanged   Review of Systems:  ROS    Physical Exam: BP 122/78   Pulse 67   Temp (!) 97.5 F (36.4 C)   Ht 5\' 10"  (1.778 m)   Wt 202 lb (91.6 kg)   SpO2 99%   BMI 28.98 kg/m  Wt Readings from Last 3 Encounters:  03/11/22 202 lb (91.6 kg)  09/13/21 242 lb 6.4 oz (110 kg)  07/12/21 240 lb (108.9 kg)   General Appearance: Well nourished, in no apparent distress. Eyes: PERRLA, EOMs, conjunctiva no swelling or erythema Sinuses: No Frontal/maxillary tenderness ENT/Mouth: Ext aud canals clear, TMs without erythema, bulging. No erythema, swelling, or exudate on post pharynx.  Tonsils not swollen or erythematous. Hearing normal.  Neck: Supple, thyroid normal.  Respiratory: Respiratory effort normal, BS equal bilaterally without rales, rhonchi, wheezing or stridor.  Cardio: RRR with no MRGs. Brisk peripheral pulses without edema.  Abdomen: Soft, + BS.  Non tender, no guarding, rebound, hernias, masses. Lymphatics: Non tender without lymphadenopathy.  Musculoskeletal: Full ROM, 5/5 strength, Normal gait Skin: Warm, dry without rashes, lesions, ecchymosis.  Neuro: Cranial nerves intact. No cerebellar symptoms.  Psych: Awake and oriented X 3, normal affect, Insight and Judgment  appropriate.    Adela Glimpse, NP 10:59 AM [facility]

## 2022-03-12 LAB — TESTOSTERONE: Testosterone: 427 ng/dL (ref 250–827)

## 2022-03-12 LAB — COMPLETE METABOLIC PANEL WITH GFR
AG Ratio: 1.8 (calc) (ref 1.0–2.5)
ALT: 21 U/L (ref 9–46)
AST: 15 U/L (ref 10–40)
Albumin: 4.4 g/dL (ref 3.6–5.1)
Alkaline phosphatase (APISO): 64 U/L (ref 36–130)
BUN: 10 mg/dL (ref 7–25)
CO2: 32 mmol/L (ref 20–32)
Calcium: 9.2 mg/dL (ref 8.6–10.3)
Chloride: 106 mmol/L (ref 98–110)
Creat: 0.81 mg/dL (ref 0.60–1.26)
Globulin: 2.4 g/dL (calc) (ref 1.9–3.7)
Glucose, Bld: 83 mg/dL (ref 65–99)
Potassium: 4.7 mmol/L (ref 3.5–5.3)
Sodium: 142 mmol/L (ref 135–146)
Total Bilirubin: 0.3 mg/dL (ref 0.2–1.2)
Total Protein: 6.8 g/dL (ref 6.1–8.1)
eGFR: 115 mL/min/{1.73_m2} (ref >=60)

## 2022-03-12 LAB — CBC WITH DIFFERENTIAL/PLATELET
Absolute Monocytes: 398 cells/uL (ref 200–950)
Basophils Absolute: 41 cells/uL (ref 0–200)
Basophils Relative: 0.8 %
Eosinophils Absolute: 281 cells/uL (ref 15–500)
Eosinophils Relative: 5.5 %
HCT: 40.2 % (ref 38.5–50.0)
Hemoglobin: 13.4 g/dL (ref 13.2–17.1)
Lymphs Abs: 2224 cells/uL (ref 850–3900)
MCH: 29.4 pg (ref 27.0–33.0)
MCHC: 33.3 g/dL (ref 32.0–36.0)
MCV: 88.2 fL (ref 80.0–100.0)
MPV: 10.8 fL (ref 7.5–12.5)
Monocytes Relative: 7.8 %
Neutro Abs: 2157 cells/uL (ref 1500–7800)
Neutrophils Relative %: 42.3 %
Platelets: 277 10*3/uL (ref 140–400)
RBC: 4.56 10*6/uL (ref 4.20–5.80)
RDW: 12.1 % (ref 11.0–15.0)
Total Lymphocyte: 43.6 %
WBC: 5.1 10*3/uL (ref 3.8–10.8)

## 2022-03-12 LAB — LIPID PANEL
Cholesterol: 138 mg/dL (ref <200)
HDL: 46 mg/dL (ref >=40)
LDL Cholesterol (Calc): 76 mg/dL (calc)
Non-HDL Cholesterol (Calc): 92 mg/dL (calc) (ref <130)
Total CHOL/HDL Ratio: 3 (calc) (ref <5.0)
Triglycerides: 80 mg/dL (ref <150)

## 2022-03-12 LAB — VITAMIN D 25 HYDROXY (VIT D DEFICIENCY, FRACTURES): Vit D, 25-Hydroxy: 92 ng/mL (ref 30–100)

## 2022-03-14 ENCOUNTER — Other Ambulatory Visit: Payer: Self-pay | Admitting: Internal Medicine

## 2022-03-14 DIAGNOSIS — F988 Other specified behavioral and emotional disorders with onset usually occurring in childhood and adolescence: Secondary | ICD-10-CM

## 2022-03-15 ENCOUNTER — Other Ambulatory Visit: Payer: Self-pay | Admitting: Internal Medicine

## 2022-03-15 ENCOUNTER — Telehealth: Payer: Self-pay | Admitting: Nurse Practitioner

## 2022-03-15 DIAGNOSIS — F988 Other specified behavioral and emotional disorders with onset usually occurring in childhood and adolescence: Secondary | ICD-10-CM

## 2022-03-15 MED ORDER — AMPHETAMINE-DEXTROAMPHETAMINE 20 MG PO TABS: ORAL_TABLET | ORAL | 0 refills | Status: DC

## 2022-03-15 NOTE — Telephone Encounter (Signed)
Pt is requesting a refill on Adderall to go to [facility] [phone]

## 2022-03-16 ENCOUNTER — Other Ambulatory Visit: Payer: Self-pay | Admitting: Nurse Practitioner

## 2022-03-16 DIAGNOSIS — F988 Other specified behavioral and emotional disorders with onset usually occurring in childhood and adolescence: Secondary | ICD-10-CM

## 2022-04-14 ENCOUNTER — Other Ambulatory Visit: Payer: Self-pay | Admitting: Nurse Practitioner

## 2022-04-14 DIAGNOSIS — F988 Other specified behavioral and emotional disorders with onset usually occurring in childhood and adolescence: Secondary | ICD-10-CM

## 2022-04-14 MED ORDER — AMPHETAMINE-DEXTROAMPHETAMINE 20 MG PO TABS: ORAL_TABLET | ORAL | 0 refills | Status: DC

## 2022-05-16 ENCOUNTER — Other Ambulatory Visit: Payer: Self-pay | Admitting: Nurse Practitioner

## 2022-05-16 DIAGNOSIS — F988 Other specified behavioral and emotional disorders with onset usually occurring in childhood and adolescence: Secondary | ICD-10-CM

## 2022-05-16 MED ORDER — AMPHETAMINE-DEXTROAMPHETAMINE 20 MG PO TABS: ORAL_TABLET | ORAL | 0 refills | Status: DC

## 2022-05-18 DIAGNOSIS — L0390 Cellulitis, unspecified: Secondary | ICD-10-CM | POA: Diagnosis not present

## 2022-06-04 IMAGING — US US ABDOMEN COMPLETE
1 series · 13 of 25 positions shown · non-contrast
Comparison: None.

CLINICAL DATA: Elevated LFTs

EXAM:
ABDOMEN ULTRASOUND COMPLETE

[Series 1: us abdomen complete · 0.23mm/px · 13 of 70 slices shown]
[im 1/70]
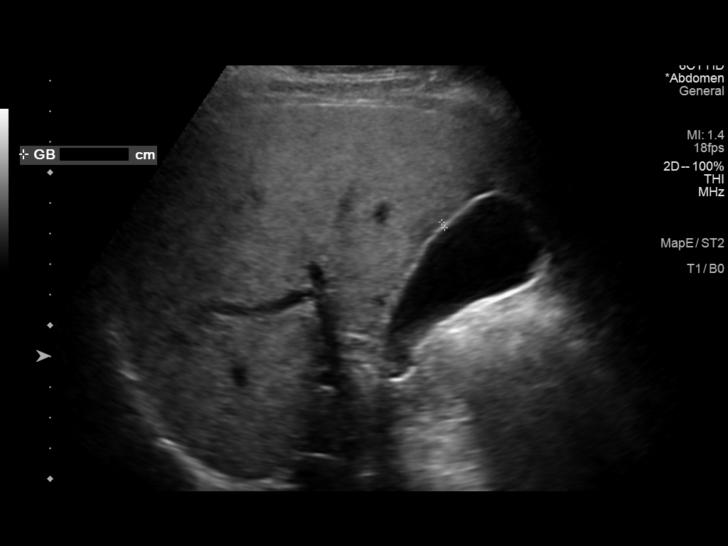
[im 6/70]
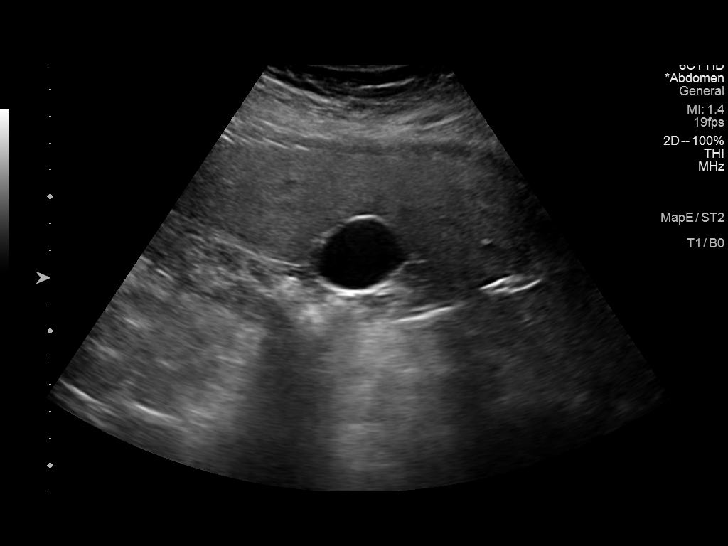
[im 12/70]
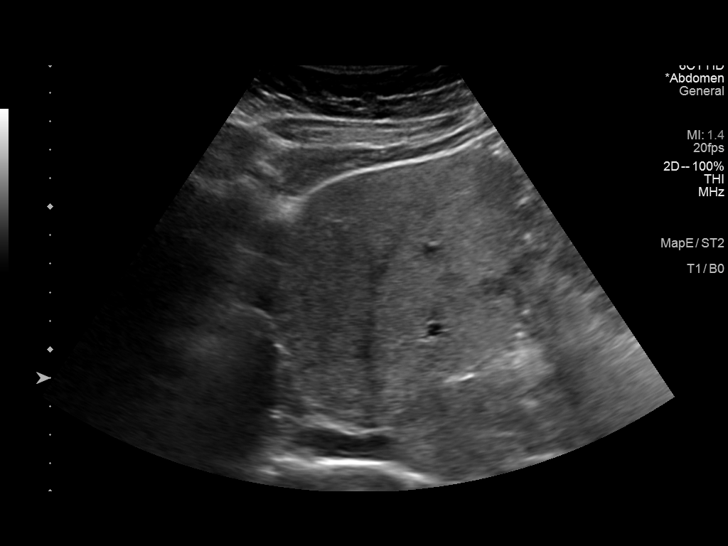
[im 18/70]
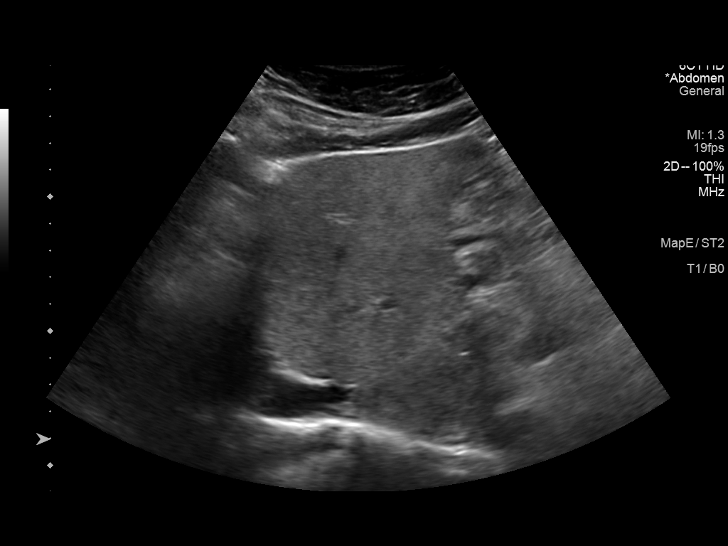
[im 24/70]
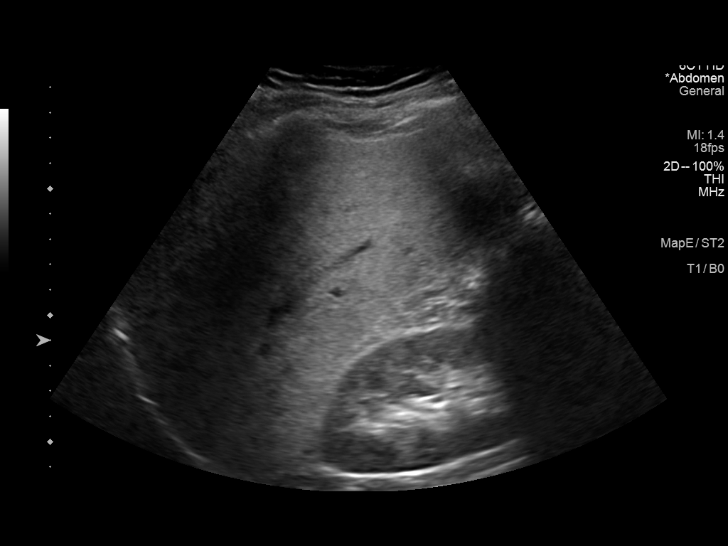
[im 29/70]
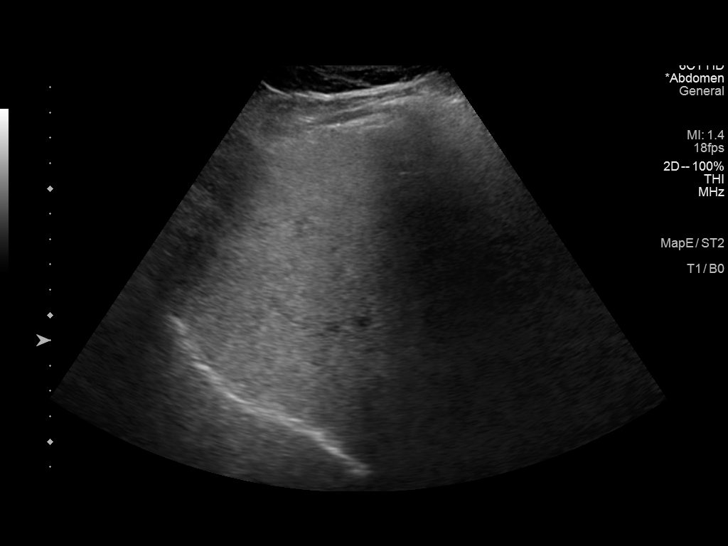
[im 35/70]
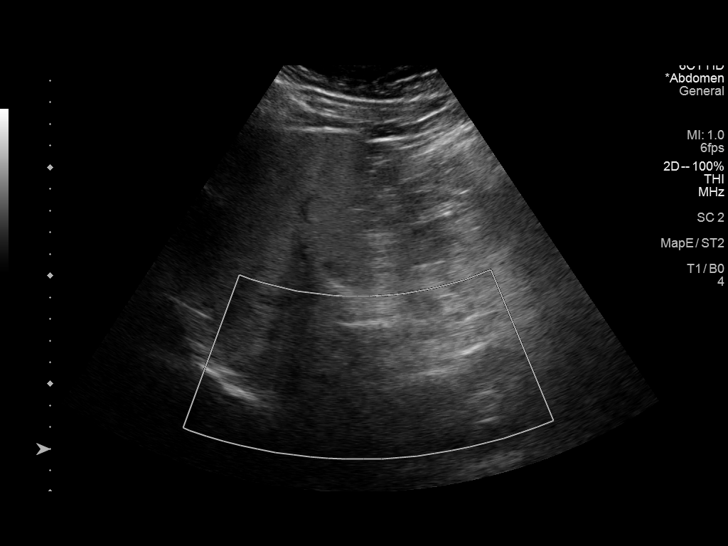
[im 41/70]
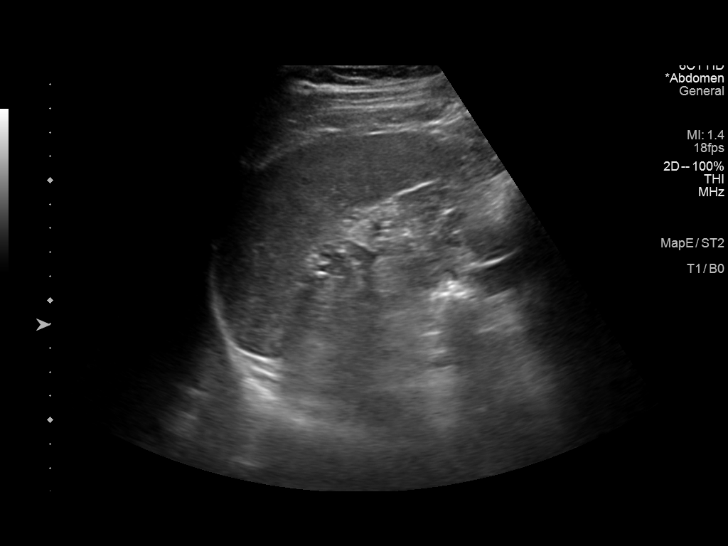
[im 47/70]
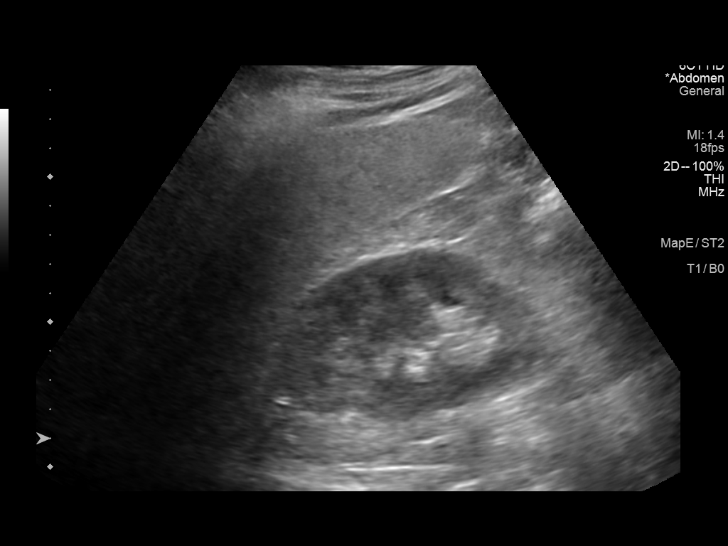
[im 52/70]
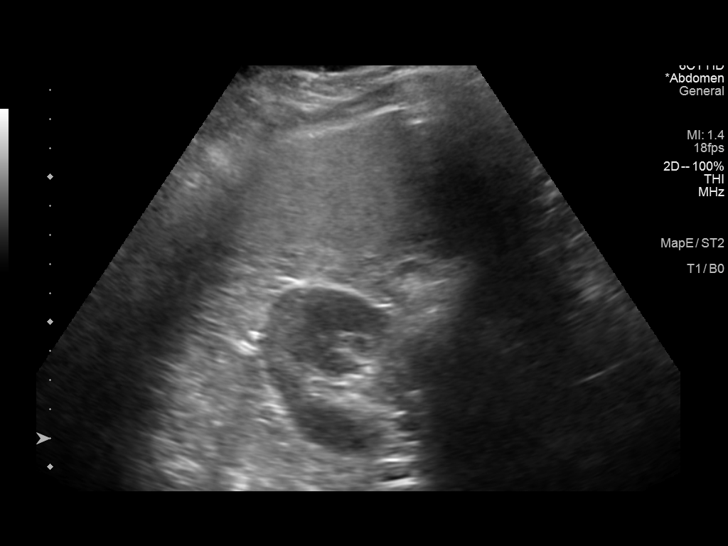
[im 58/70]
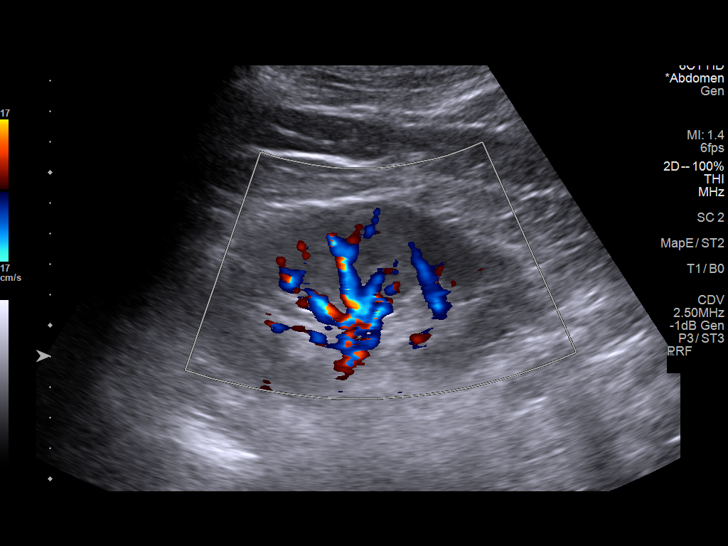
[im 64/70]
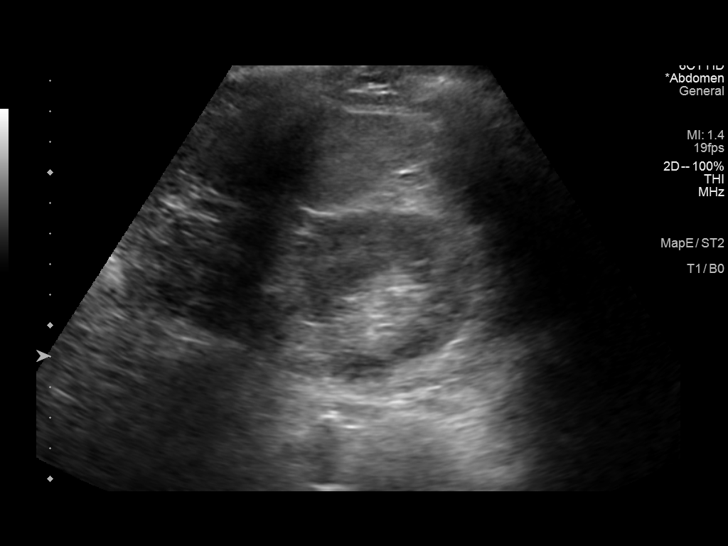
[im 70/70]
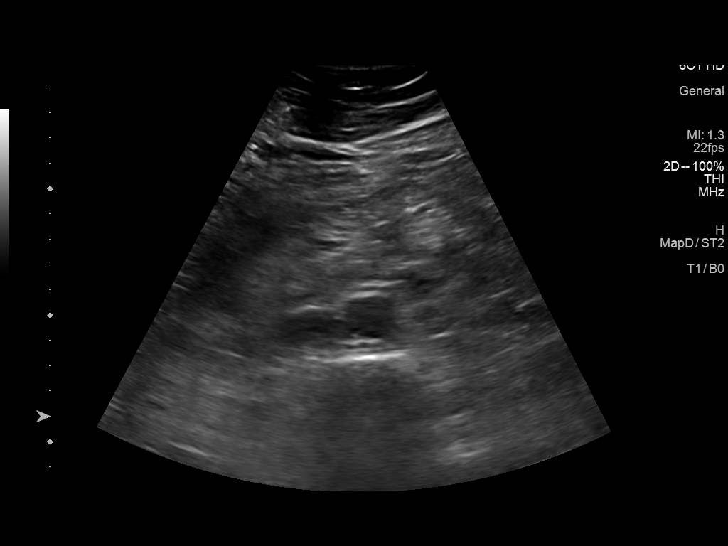

[13 of 25 positions shown; findings below may reference images not displayed]

FINDINGS: Gallbladder: No gallstones or wall thickening visualized. No
sonographic Murphy sign noted by sonographer.

Common bile duct: Diameter: 4.6 mm, nondilated

Liver: Diffusely increased hepatic echogenicity with loss of
definition of the portal triads and diminished posterior through
transmission compatible with hepatic steatosis. Tiny hypoechoic 8 x
6 x 9 mm focus in the posterior left lobe liver, difficult to fully
characterize due to diminutive size. No other focal concerning liver
lesions. Portal vein is patent on color Doppler imaging with normal
direction of blood flow towards the liver.

IVC: Obscured by bowel gas.

Pancreas: Obscured by bowel gas.

Spleen: Size and appearance within normal limits.

Right Kidney: Length: 10.1 cm. Echogenicity is within normal limits.
No concerning renal mass, shadowing calculus or hydronephrosis.

Left Kidney: Length: 10.4 cm. Echogenicity is within normal limits.
No concerning renal mass, shadowing calculus or hydronephrosis.

Abdominal aorta: No aneurysm visualized in the visible segments of
the more mid to distal aorta is partially obscured by bowel gas.

Other findings: None.
IMPRESSION: 1. Diffusely increased hepatic echogenicity with diminished
posterior through transmission most commonly seen with hepatic
steatosis.
2. 9 mm hypoechoic focus in the posterior left lobe liver is
difficult to fully characterize due to diminutive size.
Statistically likely benign such as a small cyst or biliary
hamartoma in the absence of hepatic risk factors.
3. Nonvisualization of the IVC and pancreas due to bowel gas.
4. Otherwise unremarkable abdominal ultrasound.

## 2022-06-13 ENCOUNTER — Other Ambulatory Visit: Payer: Self-pay | Admitting: Nurse Practitioner

## 2022-06-13 DIAGNOSIS — F988 Other specified behavioral and emotional disorders with onset usually occurring in childhood and adolescence: Secondary | ICD-10-CM

## 2022-06-14 MED ORDER — AMPHETAMINE-DEXTROAMPHETAMINE 20 MG PO TABS: ORAL_TABLET | ORAL | 0 refills | Status: DC

## 2022-07-12 ENCOUNTER — Other Ambulatory Visit: Payer: Self-pay | Admitting: Nurse Practitioner

## 2022-07-12 DIAGNOSIS — F988 Other specified behavioral and emotional disorders with onset usually occurring in childhood and adolescence: Secondary | ICD-10-CM

## 2022-07-12 MED ORDER — AMPHETAMINE-DEXTROAMPHETAMINE 20 MG PO TABS: ORAL_TABLET | ORAL | 0 refills | Status: DC

## 2022-08-10 ENCOUNTER — Other Ambulatory Visit: Payer: Self-pay | Admitting: Internal Medicine

## 2022-08-10 DIAGNOSIS — F988 Other specified behavioral and emotional disorders with onset usually occurring in childhood and adolescence: Secondary | ICD-10-CM

## 2022-08-10 MED ORDER — AMPHETAMINE-DEXTROAMPHETAMINE 20 MG PO TABS: ORAL_TABLET | ORAL | 0 refills | Status: DC

## 2022-09-08 ENCOUNTER — Other Ambulatory Visit: Payer: Self-pay | Admitting: Internal Medicine

## 2022-09-08 DIAGNOSIS — F988 Other specified behavioral and emotional disorders with onset usually occurring in childhood and adolescence: Secondary | ICD-10-CM

## 2022-09-08 MED ORDER — AMPHETAMINE-DEXTROAMPHETAMINE 20 MG PO TABS: ORAL_TABLET | ORAL | 0 refills | Status: DC

## 2022-09-14 ENCOUNTER — Encounter: Payer: BC Managed Care – PPO | Admitting: Internal Medicine

## 2022-10-05 NOTE — Progress Notes (Signed)
COMPLETE PHYSICAL EXAM   Assessment and Plan:   Cole Lozano was seen today for CPE  Diagnoses and all orders for this visit:  Encounter for general adult medical examination with abnormal findings Due yearly  Labile hypertension Controlled today Monitor blood pressure at home; call if consistently  Continue DASH diet.   Reminder to go to the ER if any CP, SOB, nausea, dizziness, severe HA, changes vision/speech, left arm numbness and tingling and jaw pain.  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 The patient was counseled on the addictive nature of the medication and was encouraged to take drug holidays when not needed.   Hyperlipidemia, mixed Discussed dietary and exercise modifications Continue Lipitor Will check lipids   Major Depressive disorder in partial remission Continue current regiment with benefit Wellbutrin and celexa  Abnormal Glucose Continue diet and exercise - A1c  Vitamin D deficiency Continue supplementation  Testosterone deficiency On natural supplement since last visit; requests recheck Suggested zinc, lifestyle discussed, HIIT and fat loss encouraged Prefers to avoid testosterone supplement if possible - Testosterone, Total   Medication management -     CBC with Differential/Platelet -     COMPLETE METABOLIC PANEL WITH GFR -     Magnesium -     Lipid panel -     TSH -     Hemoglobin A1c -     VITAMIN D 25 Hydroxy (Vit-D Deficiency, Fractures) -     EKG 12-Lead -     Urinalysis, Routine w reflex microscopic -     Microalbumin / creatinine urine ratio -     Testosterone  Screening for ischemic heart disease -     EKG 12-Lead  Screening for thyroid disorder -     TSH  Screening for hematuria or proteinuria -     Urinalysis, Routine w reflex microscopic -     Microalbumin / creatinine urine ratio       Continue diet and meds as discussed. Further disposition pending  results of labs. Discussed med's effects and SE's.   Over 30 minutes of exam, counseling, chart review, and critical decision making was performed.   Future Appointments  Date Time Provider Department Center  10/09/2023  9:00 AM Raynelle Dick, NP [facility] None    ----------------------------------------------------------------------------------------------------------------------  HPI 42 y.o. male  presents for 3 month follow up on HTN, HLD, Hypothyroidism, history of pre-diabetes, weight and vitamin D deficiency.   He has a 24 year old son and is currently going through a divorce. Will be a year Tuesday and can move back in the house once the court date is done.   Allergies are currently well controlled. Currently on Singulair and also on allegra.   Doing well tolerating the ADDERALL 20MG .  Takes 1 tab BID. Wellbutrin and celexa has been discontinued.  Doing well without medication.   BMI is Body mass index is 28.93 kg/m., he has  been working on diet and exercise.Marland Kitchen Has been eating healthier.Confident he can do this without medication, plans to restart logging. Wants to get <200 lb.  Wt Readings from Last 3 Encounters:  10/07/22 201 lb 9.6 oz (91.4 kg)  03/11/22 202 lb (91.6 kg)  09/13/21 242 lb 6.4 oz (110 kg)    His blood pressure has been controlled at home, today their BP is BP: 130/88  BP Readings from Last 3 Encounters:  10/07/22 130/88  03/11/22 122/78  09/13/21 138/80  He does not workout. Physically active  job. He denies any cardiac symptoms, chest pains, palpitations, shortness of breath, dizziness.    He was prescribed cholesterol medication Atorvastatin 80 mg and has been off for 1-2 months.  and denies myalgias. His cholesterol is not at goal. The cholesterol last visit was:   Lab Results  Component Value Date   CHOL 138 03/11/2022   HDL 46 03/11/2022   LDLCALC 76 03/11/2022   TRIG 80 03/11/2022   CHOLHDL 3.0 03/11/2022    He has not been working on diet  and exercise for prediabetes, and denies foot ulcerations, hyperglycemia, hypoglycemia , nausea, paresthesia of the feet, polydipsia and polyuria. Last A1C in the office was:  Lab Results  Component Value Date   HGBA1C 5.4 09/13/2021   Patient is on Vitamin D supplement.   Lab Results  Component Value Date   VD25OH 61 03/11/2022     He has a history of testosterone deficiency, has noted low motivation, low energy, has been on tribulus natural supplement for thissince last visit, requests recheck today. Admits not on zinc, planning to restart.  Lab Results  Component Value Date   TESTOSTERONE 427 03/11/2022    Current Medications:  Current Outpatient Medications on File Prior to Visit  Medication Sig   albuterol (PROVENTIL HFA;VENTOLIN HFA) 108 (90 Base) MCG/ACT inhaler Inhale 2 puffs into the lungs every 6 (six) hours as needed for wheezing or shortness of breath.   aspirin 81 MG tablet Take 81 mg by mouth daily.   atorvastatin (LIPITOR) 20 MG tablet Take  1 tablet  4 x /week   for Cholesterol (Patient taking differently: Take 10 mg by mouth daily. Take  1 tablet  4 x /week   for Cholesterol)   Cholecalciferol (VITAMIN D PO) Take 10,000 Units by mouth daily.    Fluticasone-Umeclidin-Vilant (TRELEGY ELLIPTA) 200-62.5-25 MCG/ACT AEPB Use  1 Inhalation  Daily  during Allergy Seasons.   minoxidil (ROGAINE) 2 % external solution Apply topically 2 (two) times daily.   montelukast (SINGULAIR) 10 MG tablet TAKE 1 TABLET BY MOUTH DAILY FOR ALLERGIES   multivitamin (ONE-A-DAY MEN'S) TABS tablet Take 1 tablet by mouth daily.   buPROPion (WELLBUTRIN XL) 300 MG 24 hr tablet Take  1 tablet  every Morning  for Focus & Concentration (Patient not taking: Reported on 03/11/2022)   citalopram (CELEXA) 40 MG tablet TAKE 1 TABLET BY MOUTH EVERY DAY (Patient not taking: Reported on 10/07/2022)   No current facility-administered medications on file prior to visit.    Allergies:  No Known Allergies    Medical History:  Past Medical History:  Diagnosis Date   ADD (attention deficit disorder)    Allergy    Anxiety    Asthma    Depression    Hyperlipidemia    Hypertension    Insulin resistance    history of normal A1C but insulin 60, 45   Vitamin D deficiency     Family history- Reviewed and unchanged   Social history- Reviewed and unchanged   Review of Systems:  Review of Systems  Constitutional:  Negative for malaise/fatigue and weight loss.  HENT:  Negative for hearing loss and tinnitus.   Eyes:  Negative for blurred vision and double vision.  Respiratory:  Negative for cough, shortness of breath and wheezing.   Cardiovascular:  Negative for chest pain, palpitations, orthopnea, claudication and leg swelling.  Gastrointestinal:  Negative for abdominal pain, blood in stool, constipation, diarrhea, heartburn, melena, nausea and vomiting.  Genitourinary: Negative.   Musculoskeletal:  Positive for neck pain. Negative for joint pain and myalgias.  Skin:  Negative for rash.  Neurological:  Negative for dizziness, tingling, sensory change, weakness and headaches.  Endo/Heme/Allergies:  Negative for polydipsia.  Psychiatric/Behavioral: Negative.    All other systems reviewed and are negative.     Physical Exam: BP 130/88   Pulse 70   Temp 97.9 F (36.6 C)   Resp 16   Ht 5\' 10"  (1.778 m)   Wt 201 lb 9.6 oz (91.4 kg)   SpO2 97%   BMI 28.93 kg/m  Wt Readings from Last 3 Encounters:  10/07/22 201 lb 9.6 oz (91.4 kg)  03/11/22 202 lb (91.6 kg)  09/13/21 242 lb 6.4 oz (110 kg)   General Appearance: Well nourished, in no apparent distress. Eyes: PERRLA, EOMs, conjunctiva no swelling or erythema Sinuses: No Frontal/maxillary tenderness ENT/Mouth: Ext aud canals clear, TMs without erythema, bulging. No erythema, swelling, or exudate on post pharynx.  Tonsils not swollen or erythematous. Hearing normal.  Neck: Supple, thyroid normal.  Respiratory: Respiratory effort  normal, BS equal bilaterally without rales, rhonchi, wheezing or stridor.  Cardio: RRR with no MRGs. Brisk peripheral pulses without edema.  Abdomen: Soft, + BS.  Non tender, no guarding, rebound, hernias, masses. Lymphatics: Non tender without lymphadenopathy.  Musculoskeletal: Full ROM, 5/5 strength, normal gait.  Muscle tension of neck and rhomboids Skin: Warm, dry without rashes, lesions, ecchymosis.  Neuro: Cranial nerves intact. No cerebellar symptoms.  Psych: Awake and oriented X 3, normal affect, Insight and Judgment appropriate.  EKG: NSR, no ST changes  Cole Antu Hollie Salk, NP [facility] 9:13 AM

## 2022-10-07 ENCOUNTER — Encounter: Payer: Self-pay | Admitting: Nurse Practitioner

## 2022-10-07 ENCOUNTER — Other Ambulatory Visit: Payer: Self-pay | Admitting: Nurse Practitioner

## 2022-10-07 ENCOUNTER — Ambulatory Visit (INDEPENDENT_AMBULATORY_CARE_PROVIDER_SITE_OTHER): Payer: BC Managed Care – PPO | Admitting: Nurse Practitioner

## 2022-10-07 VITALS — BP 130/88 | HR 70 | Temp 97.9°F | Resp 16 | Ht 70.0 in | Wt 201.6 lb

## 2022-10-07 DIAGNOSIS — Z1329 Encounter for screening for other suspected endocrine disorder: Secondary | ICD-10-CM | POA: Diagnosis not present

## 2022-10-07 DIAGNOSIS — R7309 Other abnormal glucose: Secondary | ICD-10-CM

## 2022-10-07 DIAGNOSIS — E559 Vitamin D deficiency, unspecified: Secondary | ICD-10-CM | POA: Diagnosis not present

## 2022-10-07 DIAGNOSIS — Z0001 Encounter for general adult medical examination with abnormal findings: Secondary | ICD-10-CM

## 2022-10-07 DIAGNOSIS — E782 Mixed hyperlipidemia: Secondary | ICD-10-CM

## 2022-10-07 DIAGNOSIS — Z1389 Encounter for screening for other disorder: Secondary | ICD-10-CM | POA: Diagnosis not present

## 2022-10-07 DIAGNOSIS — Z79899 Other long term (current) drug therapy: Secondary | ICD-10-CM | POA: Diagnosis not present

## 2022-10-07 DIAGNOSIS — R0989 Other specified symptoms and signs involving the circulatory and respiratory systems: Secondary | ICD-10-CM | POA: Diagnosis not present

## 2022-10-07 DIAGNOSIS — Z13220 Encounter for screening for lipoid disorders: Secondary | ICD-10-CM | POA: Diagnosis not present

## 2022-10-07 DIAGNOSIS — Z136 Encounter for screening for cardiovascular disorders: Secondary | ICD-10-CM | POA: Diagnosis not present

## 2022-10-07 DIAGNOSIS — F324 Major depressive disorder, single episode, in partial remission: Secondary | ICD-10-CM

## 2022-10-07 DIAGNOSIS — F988 Other specified behavioral and emotional disorders with onset usually occurring in childhood and adolescence: Secondary | ICD-10-CM

## 2022-10-07 DIAGNOSIS — E349 Endocrine disorder, unspecified: Secondary | ICD-10-CM

## 2022-10-07 DIAGNOSIS — Z131 Encounter for screening for diabetes mellitus: Secondary | ICD-10-CM | POA: Diagnosis not present

## 2022-10-07 DIAGNOSIS — Z0000 Encounter for general adult medical examination without abnormal findings: Secondary | ICD-10-CM | POA: Diagnosis not present

## 2022-10-07 MED ORDER — AMPHETAMINE-DEXTROAMPHETAMINE 20 MG PO TABS
ORAL_TABLET | ORAL | 0 refills | Status: DC
Start: 2022-10-07 — End: 2022-11-07

## 2022-10-07 NOTE — Patient Instructions (Signed)

## 2022-10-08 LAB — CBC WITH DIFFERENTIAL/PLATELET
Absolute Monocytes: 498 cells/uL (ref 200–950)
Basophils Absolute: 48 cells/uL (ref 0–200)
Basophils Relative: 0.8 %
Eosinophils Absolute: 288 cells/uL (ref 15–500)
Eosinophils Relative: 4.8 %
HCT: 41.6 % (ref 38.5–50.0)
Hemoglobin: 14 g/dL (ref 13.2–17.1)
Lymphs Abs: 2220 cells/uL (ref 850–3900)
MCH: 29.7 pg (ref 27.0–33.0)
MCHC: 33.7 g/dL (ref 32.0–36.0)
MCV: 88.1 fL (ref 80.0–100.0)
MPV: 10.1 fL (ref 7.5–12.5)
Monocytes Relative: 8.3 %
Neutro Abs: 2946 cells/uL (ref 1500–7800)
Neutrophils Relative %: 49.1 %
Platelets: 293 10*3/uL (ref 140–400)
RBC: 4.72 10*6/uL (ref 4.20–5.80)
RDW: 12 % (ref 11.0–15.0)
Total Lymphocyte: 37 %
WBC: 6 10*3/uL (ref 3.8–10.8)

## 2022-10-08 LAB — COMPLETE METABOLIC PANEL WITH GFR
AG Ratio: 1.7 (calc) (ref 1.0–2.5)
ALT: 19 U/L (ref 9–46)
AST: 19 U/L (ref 10–40)
Albumin: 5 g/dL (ref 3.6–5.1)
Alkaline phosphatase (APISO): 57 U/L (ref 36–130)
BUN: 22 mg/dL (ref 7–25)
CO2: 30 mmol/L (ref 20–32)
Calcium: 10.5 mg/dL — ABNORMAL HIGH (ref 8.6–10.3)
Chloride: 103 mmol/L (ref 98–110)
Creat: 0.97 mg/dL (ref 0.60–1.29)
Globulin: 2.9 g/dL (calc) (ref 1.9–3.7)
Glucose, Bld: 95 mg/dL (ref 65–99)
Potassium: 5.5 mmol/L — ABNORMAL HIGH (ref 3.5–5.3)
Sodium: 143 mmol/L (ref 135–146)
Total Bilirubin: 0.7 mg/dL (ref 0.2–1.2)
Total Protein: 7.9 g/dL (ref 6.1–8.1)
eGFR: 101 mL/min/{1.73_m2} (ref >=60)

## 2022-10-08 LAB — URINALYSIS, ROUTINE W REFLEX MICROSCOPIC
Bilirubin Urine: NEGATIVE
Glucose, UA: NEGATIVE
Hgb urine dipstick: NEGATIVE
Ketones, ur: NEGATIVE
Leukocytes,Ua: NEGATIVE
Nitrite: NEGATIVE
Protein, ur: NEGATIVE
Specific Gravity, Urine: 1.028 (ref 1.001–1.035)
pH: 6.5 (ref 5.0–8.0)

## 2022-10-08 LAB — MICROALBUMIN / CREATININE URINE RATIO
Creatinine, Urine: 157 mg/dL (ref 20–320)
Microalb Creat Ratio: 4 mg/g creat (ref <30)
Microalb, Ur: 0.6 mg/dL

## 2022-10-08 LAB — LIPID PANEL
Cholesterol: 224 mg/dL — ABNORMAL HIGH (ref <200)
HDL: 51 mg/dL (ref >=40)
LDL Cholesterol (Calc): 151 mg/dL (calc) — ABNORMAL HIGH
Non-HDL Cholesterol (Calc): 173 mg/dL (calc) — ABNORMAL HIGH (ref <130)
Total CHOL/HDL Ratio: 4.4 (calc) (ref <5.0)
Triglycerides: 102 mg/dL (ref <150)

## 2022-10-08 LAB — HEMOGLOBIN A1C
Hgb A1c MFr Bld: 5.3 % of total Hgb (ref <5.7)
Mean Plasma Glucose: 105 mg/dL
eAG (mmol/L): 5.8 mmol/L

## 2022-10-08 LAB — TESTOSTERONE: Testosterone: 350 ng/dL (ref 250–827)

## 2022-10-08 LAB — VITAMIN D 25 HYDROXY (VIT D DEFICIENCY, FRACTURES): Vit D, 25-Hydroxy: 93 ng/mL (ref 30–100)

## 2022-10-08 LAB — TSH: TSH: 4.32 mIU/L (ref 0.40–4.50)

## 2022-10-08 LAB — MAGNESIUM: Magnesium: 2.3 mg/dL (ref 1.5–2.5)

## 2022-11-07 ENCOUNTER — Other Ambulatory Visit: Payer: Self-pay | Admitting: Nurse Practitioner

## 2022-11-07 DIAGNOSIS — F988 Other specified behavioral and emotional disorders with onset usually occurring in childhood and adolescence: Secondary | ICD-10-CM

## 2022-11-07 MED ORDER — AMPHETAMINE-DEXTROAMPHETAMINE 20 MG PO TABS
ORAL_TABLET | ORAL | 0 refills | Status: DC
Start: 2022-11-07 — End: 2022-12-06

## 2022-12-06 ENCOUNTER — Other Ambulatory Visit: Payer: Self-pay | Admitting: Nurse Practitioner

## 2022-12-06 DIAGNOSIS — F988 Other specified behavioral and emotional disorders with onset usually occurring in childhood and adolescence: Secondary | ICD-10-CM

## 2022-12-06 MED ORDER — AMPHETAMINE-DEXTROAMPHETAMINE 20 MG PO TABS
ORAL_TABLET | ORAL | 0 refills | Status: DC
Start: 2022-12-06 — End: 2023-01-03

## 2023-01-03 ENCOUNTER — Other Ambulatory Visit: Payer: Self-pay | Admitting: Nurse Practitioner

## 2023-01-03 DIAGNOSIS — F988 Other specified behavioral and emotional disorders with onset usually occurring in childhood and adolescence: Secondary | ICD-10-CM

## 2023-01-03 MED ORDER — AMPHETAMINE-DEXTROAMPHETAMINE 20 MG PO TABS
ORAL_TABLET | ORAL | 0 refills | Status: DC
Start: 2023-01-03 — End: 2023-02-01

## 2023-02-01 ENCOUNTER — Other Ambulatory Visit: Payer: Self-pay | Admitting: Nurse Practitioner

## 2023-02-01 DIAGNOSIS — F988 Other specified behavioral and emotional disorders with onset usually occurring in childhood and adolescence: Secondary | ICD-10-CM

## 2023-02-02 MED ORDER — AMPHETAMINE-DEXTROAMPHETAMINE 20 MG PO TABS
ORAL_TABLET | ORAL | 0 refills | Status: DC
Start: 2023-02-02 — End: 2023-03-01

## 2023-03-01 ENCOUNTER — Other Ambulatory Visit: Payer: Self-pay | Admitting: Nurse Practitioner

## 2023-03-01 DIAGNOSIS — F988 Other specified behavioral and emotional disorders with onset usually occurring in childhood and adolescence: Secondary | ICD-10-CM

## 2023-03-02 MED ORDER — AMPHETAMINE-DEXTROAMPHETAMINE 20 MG PO TABS
ORAL_TABLET | ORAL | 0 refills | Status: DC
Start: 2023-03-02 — End: 2023-03-30

## 2023-03-30 ENCOUNTER — Other Ambulatory Visit: Payer: Self-pay | Admitting: Nurse Practitioner

## 2023-03-30 DIAGNOSIS — F988 Other specified behavioral and emotional disorders with onset usually occurring in childhood and adolescence: Secondary | ICD-10-CM

## 2023-03-30 MED ORDER — AMPHETAMINE-DEXTROAMPHETAMINE 20 MG PO TABS
ORAL_TABLET | ORAL | 0 refills | Status: DC
Start: 2023-03-30 — End: 2023-05-01

## 2023-04-13 NOTE — Progress Notes (Signed)
3 Month Follow Up   Assessment and Plan:   Cole Lozano was seen today for follow-up.  Diagnoses and all orders for this visit:  Labile hypertension Controlled currently without meds Monitor blood pressure at home; call if consistently  Continue DASH diet.   Reminder to go to the ER if any CP, SOB, nausea, dizziness, severe HA, changes vision/speech, left arm numbness and tingling and jaw pain.  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 The patient was counseled on the addictive nature of the medication and was encouraged to take drug holidays when not needed.   Hyperlipidemia, mixed Discussed dietary and exercise modifications Stopped atorvastatin several months ago- working hard on diet andexercise Will check lipids   Depression, controlled Continue current regiment with benefit Wellbutrin and celexa  Vitamin D deficiency Continue supplementation  Testosterone deficiency On natural supplement since last visit; requests recheck Suggested zinc, lifestyle discussed, HIIT and fat loss encouraged Prefers to avoid testosterone supplement if possible  - Testosterone  Folliculitis Continue topical treatment Doxycycline 100 mg BID X 7 if worsens  Medication management -     CBC with Differential/Platelet -     COMPLETE METABOLIC PANEL WITH GFR -     Magnesium -     Lipid panel  Hypercalcemia -     COMPLETE METABOLIC PANEL WITH GFR  Folliculitis -     doxycycline (VIBRAMYCIN) 100 MG capsule; Take 1 capsule twice daily with food     Continue diet and meds as discussed. Further disposition pending results of labs. Discussed med's effects and SE's.   Over 30 minutes of exam, counseling, chart review, and critical decision making was performed.   Future Appointments  Date Time Provider Department Center  10/09/2023  9:00 AM Cole Dick, NP [facility] None     ----------------------------------------------------------------------------------------------------------------------  HPI 42 y.o. male  presents for 3 month follow up on HTN, HLD, Hypothyroidism, history of pre-diabetes, weight and vitamin D deficiency.   Doing well tolerating the ADDERALL 20MG .  Takes 1 tab BID PRN on days he works. At least once a week, denies concerning SE.   He has been having issue with recurrent folliculitis of scalp- has used 1 course of doxycycline in the past which did clear it up.  Currently using Neutrogena shampoo and topical which is working well  Wellbutrin and celexa has been working well and he is managing stress at work.  Reports he works long hours and has a high demanding job.  He has got his divorce finalized.   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 Was prescribed wegovy but never started, mostly needs to stop eating fast food at 3AM. His highest weight was 255 Wt Readings from Last 3 Encounters:  04/14/23 206 lb (93.4 kg)  10/07/22 201 lb 9.6 oz (91.4 kg)  03/11/22 202 lb (91.6 kg)   His blood pressure has been controlled at home, today their BP is BP: (!) 130/94  BP Readings from Last 3 Encounters:  04/14/23 (!) 130/94  10/07/22 130/88  03/11/22 122/78   He does not workout. Physically active job. He denies any cardiac symptoms, chest pains, palpitations, shortness of breath, dizziness.   He was prescribed atorvastatin but has been following a strict low saturated fat diet and increased exercise. His cholesterol is not at  goal. The cholesterol last visit was:   Lab Results  Component Value Date   CHOL 224 (H) 10/07/2022   HDL 51 10/07/2022   LDLCALC 151 (H) 10/07/2022   TRIG 102 10/07/2022   CHOLHDL 4.4 10/07/2022    He has not been working on diet and exercise for prediabetes, and denies foot ulcerations,  hyperglycemia, hypoglycemia , nausea, paresthesia of the feet, polydipsia and polyuria. Last A1C in the office was:  Lab Results  Component Value Date   HGBA1C 5.3 10/07/2022   Patient is on Vitamin D supplement.   Lab Results  Component Value Date   VD25OH 58 10/07/2022     He has a history of testosterone deficiency, has noted low motivation, low energy, has been on tribulus natural supplement for thissince last visit, requests recheck today. Admits not on zinc, planning to restart.  Lab Results  Component Value Date   TESTOSTERONE 350 10/07/2022   Slight calcium elevation at last check Lab Results  Component Value Date   CALCIUM 10.5 (H) 10/07/2022     Current Medications:  Current Outpatient Medications on File Prior to Visit  Medication Sig   albuterol (PROVENTIL HFA;VENTOLIN HFA) 108 (90 Base) MCG/ACT inhaler Inhale 2 puffs into the lungs every 6 (six) hours as needed for wheezing or shortness of breath.   amphetamine-dextroamphetamine (ADDERALL) 20 MG tablet 1/2-1 tab 1 or 2 x /daily for ADD, should not take daily to avoid tolerance / addiction - script should last longer than 30 days   aspirin 81 MG tablet Take 81 mg by mouth daily.   Cholecalciferol (VITAMIN D PO) Take 10,000 Units by mouth daily.    Fluticasone-Umeclidin-Vilant (TRELEGY ELLIPTA) 200-62.5-25 MCG/ACT AEPB Use  1 Inhalation  Daily  during Allergy Seasons.   montelukast (SINGULAIR) 10 MG tablet TAKE 1 TABLET BY MOUTH DAILY FOR ALLERGIES   multivitamin (ONE-A-DAY MEN'S) TABS tablet Take 1 tablet by mouth daily.   atorvastatin (LIPITOR) 20 MG tablet Take  1 tablet  4 x /week   for Cholesterol (Patient not taking: Reported on 04/14/2023)   minoxidil (ROGAINE) 2 % external solution Apply topically 2 (two) times daily. (Patient not taking: Reported on 04/14/2023)   No current facility-administered medications on file prior to visit.    Allergies:  No Known Allergies   Medical History:  Past Medical History:   Diagnosis Date   ADD (attention deficit disorder)    Allergy    Anxiety    Asthma    Depression    Hyperlipidemia    Hypertension    Insulin resistance    history of normal A1C but insulin 60, 46   Vitamin D deficiency     Family history- Reviewed and unchanged   Social history- Reviewed and unchanged   Review of Systems:  Review of Systems  Constitutional:  Negative for malaise/fatigue and weight loss.  HENT:  Negative for hearing loss and tinnitus.   Eyes:  Negative for blurred vision and double vision.  Respiratory:  Negative for cough, shortness of breath and wheezing.   Cardiovascular:  Negative for chest pain, palpitations, orthopnea, claudication and leg swelling.  Gastrointestinal:  Negative for abdominal pain, blood in stool, constipation, diarrhea, heartburn, melena, nausea and vomiting.  Genitourinary: Negative.   Musculoskeletal:  Negative for joint pain and myalgias.  Skin:  Positive for rash (folliculitis of scalp).  Neurological:  Negative for dizziness, tingling, sensory change, weakness and headaches.  Endo/Heme/Allergies:  Negative for polydipsia.  Psychiatric/Behavioral: Negative.    All  other systems reviewed and are negative.     Physical Exam: BP (!) 130/94   Pulse 76   Temp 98.2 F (36.8 C)   Ht 5\' 10"  (1.778 m)   Wt 206 lb (93.4 kg)   SpO2 98%   BMI 29.56 kg/m  Wt Readings from Last 3 Encounters:  04/14/23 206 lb (93.4 kg)  10/07/22 201 lb 9.6 oz (91.4 kg)  03/11/22 202 lb (91.6 kg)   General Appearance: Well nourished, in no apparent distress. Eyes: PERRLA, EOMs, conjunctiva no swelling or erythema Sinuses: No Frontal/maxillary tenderness ENT/Mouth: Ext aud canals clear, TMs without erythema, bulging. No erythema, swelling, or exudate on post pharynx.  Tonsils not swollen or erythematous. Hearing normal.  Neck: Supple, thyroid normal.  Respiratory: Respiratory effort normal, BS equal bilaterally without rales, rhonchi, wheezing or  stridor.  Cardio: RRR with no MRGs. Brisk peripheral pulses without edema.  Abdomen: Soft, + BS.  Non tender, no guarding, rebound, hernias, masses. Lymphatics: Non tender without lymphadenopathy.  Musculoskeletal: Full ROM, 5/5 strength, normal gait.   Skin: Warm, dry. Folliculitis notice of scalp and several spots in beard  Neuro: Cranial nerves intact. No cerebellar symptoms.  Psych: Awake and oriented X 3, normal affect, Insight and Judgment appropriate.    Cole Dick, NP [facility] 9:50 AM

## 2023-04-14 ENCOUNTER — Ambulatory Visit (INDEPENDENT_AMBULATORY_CARE_PROVIDER_SITE_OTHER): Payer: BC Managed Care – PPO | Admitting: Nurse Practitioner

## 2023-04-14 ENCOUNTER — Encounter: Payer: Self-pay | Admitting: Nurse Practitioner

## 2023-04-14 VITALS — BP 130/94 | HR 76 | Temp 98.2°F | Ht 70.0 in | Wt 206.0 lb

## 2023-04-14 DIAGNOSIS — L739 Follicular disorder, unspecified: Secondary | ICD-10-CM

## 2023-04-14 DIAGNOSIS — E349 Endocrine disorder, unspecified: Secondary | ICD-10-CM

## 2023-04-14 DIAGNOSIS — F988 Other specified behavioral and emotional disorders with onset usually occurring in childhood and adolescence: Secondary | ICD-10-CM | POA: Diagnosis not present

## 2023-04-14 DIAGNOSIS — R0989 Other specified symptoms and signs involving the circulatory and respiratory systems: Secondary | ICD-10-CM | POA: Diagnosis not present

## 2023-04-14 DIAGNOSIS — Z79899 Other long term (current) drug therapy: Secondary | ICD-10-CM | POA: Diagnosis not present

## 2023-04-14 DIAGNOSIS — E559 Vitamin D deficiency, unspecified: Secondary | ICD-10-CM

## 2023-04-14 DIAGNOSIS — E782 Mixed hyperlipidemia: Secondary | ICD-10-CM

## 2023-04-14 DIAGNOSIS — F324 Major depressive disorder, single episode, in partial remission: Secondary | ICD-10-CM | POA: Diagnosis not present

## 2023-04-14 MED ORDER — DOXYCYCLINE HYCLATE 100 MG PO CAPS: ORAL_CAPSULE | ORAL | 0 refills | Status: DC

## 2023-04-14 NOTE — Patient Instructions (Signed)
Folliculitis  Folliculitis occurs when hair follicles become inflamed. A hair follicle is a tiny opening in your skin where your hair grows from. This condition often occurs on the scalp, thighs, legs, back, and buttocks but can happen anywhere on the body. What are the causes? A common cause of this condition is an infection from bacteria. The type of folliculitis caused by bacteria can last a long time or go away and come back. The bacteria can live anywhere on your skin. They are often found in the nostrils. Other causes may include: An infection from a fungus. An infection from a virus. Your skin touching some chemicals, such as oils and tars. Shaving or waxing. Greasy ointments or creams put on the skin. What increases the risk? You are more likely to develop this condition if: Your body has a weak disease-fighting system (immune system). You have diabetes. You are obese. What are the signs or symptoms? Symptoms of this condition include: Redness. Soreness. Swelling. Itching. Small white or yellow, itchy spots filled with pus (pustules) that appear over a red area. If the infection goes deep into the follicle, these may turn into a boil (furuncle). A group of boils (carbuncle). These tend to form in hairy, sweaty areas of the body. How is this diagnosed? This condition is diagnosed with a skin exam. Your health care provider may take a sample of one of the pustules or boils to test in a lab. How is this treated? This condition may be treated by: Putting a warm, wet cloth (warm compress) on the affected areas. Taking antibiotics or applying them to the skin. Applying or bathing with a solution that kills germs (antiseptic). Taking an over-the-counter medicine. This can help with itching. Having a procedure to drain pustules or boils. This may be done if a pustule or boil contains a lot of pus or fluid. Having laser hair removal. This may be done when the condition lasts for a  long time. Follow these instructions at home: Managing pain and swelling  If directed, apply heat to the affected area as often as told by your health care provider. Use the heat source that your health care provider recommends, such as a moist heat pack or a heating pad. Place a towel between your skin and the heat source. Leave the heat on for 20-30 minutes. If your skin turns bright red, remove the heat right away to prevent burns. The risk of burns is higher if you cannot feel pain, heat, or cold. General instructions Take over-the-counter and prescription medicines only as told by your health care provider. If you were prescribed antibiotics, take or apply them as told by your health care provider. Do not stop using the antibiotic even if you start to feel better. Check your irritated area every day for signs of infection. Check for: More redness, swelling, or pain. Fluid or blood. Warmth. Pus or a bad smell. Do not shave irritated skin. Keep all follow-up visits. Your health care provider will check if the treatments are helping. Contact a health care provider if: You have a fever. You have any signs of infection. Red streaks are spreading from the affected area. This information is not intended to replace advice given to you by your health care provider. Make sure you discuss any questions you have with your health care provider. Document Revised: 09/14/2021 Document Reviewed: 09/14/2021 Elsevier Patient Education  2024 [employer].

## 2023-04-15 LAB — CBC WITH DIFFERENTIAL/PLATELET
Absolute Lymphocytes: 2085 {cells}/uL (ref 850–3900)
Absolute Monocytes: 556 {cells}/uL (ref 200–950)
Basophils Absolute: 52 {cells}/uL (ref 0–200)
Basophils Relative: 1 %
Eosinophils Absolute: 317 {cells}/uL (ref 15–500)
Eosinophils Relative: 6.1 %
HCT: 43.4 % (ref 38.5–50.0)
Hemoglobin: 14.2 g/dL (ref 13.2–17.1)
MCH: 29.2 pg (ref 27.0–33.0)
MCHC: 32.7 g/dL (ref 32.0–36.0)
MCV: 89.1 fL (ref 80.0–100.0)
MPV: 10.3 fL (ref 7.5–12.5)
Monocytes Relative: 10.7 %
Neutro Abs: 2189 {cells}/uL (ref 1500–7800)
Neutrophils Relative %: 42.1 %
Platelets: 290 10*3/uL (ref 140–400)
RBC: 4.87 10*6/uL (ref 4.20–5.80)
RDW: 12 % (ref 11.0–15.0)
Total Lymphocyte: 40.1 %
WBC: 5.2 10*3/uL (ref 3.8–10.8)

## 2023-04-15 LAB — COMPLETE METABOLIC PANEL WITH GFR
AG Ratio: 1.7 (calc) (ref 1.0–2.5)
ALT: 20 U/L (ref 9–46)
AST: 21 U/L (ref 10–40)
Albumin: 4.7 g/dL (ref 3.6–5.1)
Alkaline phosphatase (APISO): 57 U/L (ref 36–130)
BUN: 12 mg/dL (ref 7–25)
CO2: 28 mmol/L (ref 20–32)
Calcium: 9.8 mg/dL (ref 8.6–10.3)
Chloride: 105 mmol/L (ref 98–110)
Creat: 1 mg/dL (ref 0.60–1.29)
Globulin: 2.7 g/dL (ref 1.9–3.7)
Glucose, Bld: 101 mg/dL — ABNORMAL HIGH (ref 65–99)
Potassium: 4.6 mmol/L (ref 3.5–5.3)
Sodium: 144 mmol/L (ref 135–146)
Total Bilirubin: 0.5 mg/dL (ref 0.2–1.2)
Total Protein: 7.4 g/dL (ref 6.1–8.1)
eGFR: 97 mL/min/{1.73_m2} (ref >=60)

## 2023-04-15 LAB — LIPID PANEL
Cholesterol: 212 mg/dL — ABNORMAL HIGH (ref <200)
HDL: 42 mg/dL (ref >=40)
LDL Cholesterol (Calc): 148 mg/dL — ABNORMAL HIGH
Non-HDL Cholesterol (Calc): 170 mg/dL — ABNORMAL HIGH (ref <130)
Total CHOL/HDL Ratio: 5 (calc) — ABNORMAL HIGH (ref <5.0)
Triglycerides: 106 mg/dL (ref <150)

## 2023-04-15 LAB — TESTOSTERONE: Testosterone: 459 ng/dL (ref 250–827)

## 2023-04-15 LAB — MAGNESIUM: Magnesium: 2.3 mg/dL (ref 1.5–2.5)

## 2023-05-01 ENCOUNTER — Other Ambulatory Visit: Payer: Self-pay | Admitting: Nurse Practitioner

## 2023-05-01 DIAGNOSIS — F988 Other specified behavioral and emotional disorders with onset usually occurring in childhood and adolescence: Secondary | ICD-10-CM

## 2023-05-01 MED ORDER — AMPHETAMINE-DEXTROAMPHETAMINE 20 MG PO TABS: ORAL_TABLET | ORAL | 0 refills | Status: DC

## 2023-05-29 ENCOUNTER — Other Ambulatory Visit: Payer: Self-pay | Admitting: Nurse Practitioner

## 2023-05-29 DIAGNOSIS — F988 Other specified behavioral and emotional disorders with onset usually occurring in childhood and adolescence: Secondary | ICD-10-CM

## 2023-05-29 MED ORDER — AMPHETAMINE-DEXTROAMPHETAMINE 20 MG PO TABS: ORAL_TABLET | ORAL | 0 refills | Status: DC

## 2023-06-29 ENCOUNTER — Other Ambulatory Visit: Payer: Self-pay | Admitting: Nurse Practitioner

## 2023-06-29 DIAGNOSIS — F988 Other specified behavioral and emotional disorders with onset usually occurring in childhood and adolescence: Secondary | ICD-10-CM

## 2023-06-29 MED ORDER — AMPHETAMINE-DEXTROAMPHETAMINE 20 MG PO TABS
ORAL_TABLET | ORAL | 0 refills | Status: DC
Start: 2023-06-29 — End: 2023-08-01

## 2023-08-01 ENCOUNTER — Other Ambulatory Visit: Payer: Self-pay | Admitting: Internal Medicine

## 2023-08-01 DIAGNOSIS — F988 Other specified behavioral and emotional disorders with onset usually occurring in childhood and adolescence: Secondary | ICD-10-CM

## 2023-08-01 MED ORDER — AMPHETAMINE-DEXTROAMPHETAMINE 20 MG PO TABS: ORAL_TABLET | ORAL | 0 refills | Status: DC

## 2023-08-01 NOTE — Telephone Encounter (Signed)
 Copied from CRM [id number]. Topic: Clinical - Prescription Issue >> Aug 01, 2023  9:12 AM Turkey B wrote: Reason for CRM: Caller , former pt, with Dr Oneta Rack, needs refill of amphetamine-dextroamphetamine (ADDERALL) 20 MG tablet sent to [facility] #[id number] - [place], [place] - [address]  Phone: [phone] Fax: [phone]

## 2023-08-31 ENCOUNTER — Other Ambulatory Visit: Payer: Self-pay | Admitting: Internal Medicine

## 2023-08-31 DIAGNOSIS — F988 Other specified behavioral and emotional disorders with onset usually occurring in childhood and adolescence: Secondary | ICD-10-CM

## 2023-08-31 NOTE — Telephone Encounter (Signed)
 Copied from CRM [id number]. Topic: Clinical - Medication Refill >> Aug 31, 2023  4:33 PM Donald Frost wrote: Medication: amphetamine -dextroamphetamine  (ADDERALL) 20 MG tablet  Has the patient contacted their pharmacy? No   This is the patient's preferred pharmacy:    [facility] #[id number] - [place], [place] - [address] [place] [address] Phone: [phone] Fax: [phone]  Is this the correct pharmacy for this prescription? Yes If no, delete pharmacy and type the correct one.   Has the prescription been filled recently? No  Is the patient out of the medication? No but he only has a few days left  Has the patient been seen for an appointment in the last year OR does the patient have an upcoming appointment? Yes  Can we respond through MyChart? Yes  Please assist patient further

## 2023-09-01 MED ORDER — AMPHETAMINE-DEXTROAMPHETAMINE 20 MG PO TABS: ORAL_TABLET | ORAL | 0 refills | Status: DC

## 2023-10-02 ENCOUNTER — Other Ambulatory Visit: Payer: Self-pay | Admitting: Internal Medicine

## 2023-10-02 DIAGNOSIS — F988 Other specified behavioral and emotional disorders with onset usually occurring in childhood and adolescence: Secondary | ICD-10-CM

## 2023-10-02 NOTE — Telephone Encounter (Unsigned)
 Copied from CRM [id number]. Topic: Clinical - Medication Refill >> Oct 02, 2023 10:17 AM Everette C wrote: Medication: amphetamine -dextroamphetamine  (ADDERALL) 20 MG tablet [[id number]]   Has the patient contacted their pharmacy? No (Agent: If no, request that the patient contact the pharmacy for the refill. If patient does not wish to contact the pharmacy document the reason why and proceed with request.) (Agent: If yes, when and what did the pharmacy advise?)  This is the patient's preferred pharmacy:  [facility] #[id number] - [place], [place] - [address] [place] [address] Phone: [phone] Fax: [phone]  Is this the correct pharmacy for this prescription? Yes If no, delete pharmacy and type the correct one.   Has the prescription been filled recently? Yes  Is the patient out of the medication? No  Has the patient been seen for an appointment in the last year OR does the patient have an upcoming appointment? Yes  Can we respond through MyChart? No  Agent: Please be advised that Rx refills may take up to 3 business days. We ask that you follow-up with your pharmacy.

## 2023-10-03 MED ORDER — AMPHETAMINE-DEXTROAMPHETAMINE 20 MG PO TABS: ORAL_TABLET | ORAL | 0 refills | Status: DC

## 2023-10-03 NOTE — Telephone Encounter (Signed)
 Name of Medication: amphetamine -dextroamphetamine  (adderall) 20mg  Name of Pharmacy:  [facility] #[id number] - [place], [place] - [address]  Last Fill or Written Date and Quantity: 09/01/2023 60tab 0refills  Last Office Visit and Type: 04/14/23 With Sherald Dienes for HTN and ADD  Next Office Visit and Type: 10/20/23 with Rudd for follow up  Last Controlled Substance Agreement Date: none  Last UDS: none

## 2023-10-09 ENCOUNTER — Encounter: Payer: BC Managed Care – PPO | Admitting: Nurse Practitioner

## 2023-10-20 ENCOUNTER — Ambulatory Visit (INDEPENDENT_AMBULATORY_CARE_PROVIDER_SITE_OTHER): Payer: Self-pay | Admitting: Family Medicine

## 2023-10-20 VITALS — BP 122/78 | HR 67 | Temp 97.3°F | Ht 70.0 in | Wt 198.4 lb

## 2023-10-20 DIAGNOSIS — E349 Endocrine disorder, unspecified: Secondary | ICD-10-CM | POA: Diagnosis not present

## 2023-10-20 DIAGNOSIS — K760 Fatty (change of) liver, not elsewhere classified: Secondary | ICD-10-CM | POA: Diagnosis not present

## 2023-10-20 DIAGNOSIS — E782 Mixed hyperlipidemia: Secondary | ICD-10-CM | POA: Diagnosis not present

## 2023-10-20 DIAGNOSIS — J4520 Mild intermittent asthma, uncomplicated: Secondary | ICD-10-CM

## 2023-10-20 DIAGNOSIS — F900 Attention-deficit hyperactivity disorder, predominantly inattentive type: Secondary | ICD-10-CM | POA: Diagnosis not present

## 2023-10-20 DIAGNOSIS — R0989 Other specified symptoms and signs involving the circulatory and respiratory systems: Secondary | ICD-10-CM

## 2023-10-20 NOTE — Assessment & Plan Note (Signed)
 Stable. Continue use of Trelegy and albuterol  as needed for flares.

## 2023-10-20 NOTE — Assessment & Plan Note (Signed)
 I will plan to continue Adderall 20 mg bid.

## 2023-10-20 NOTE — Assessment & Plan Note (Signed)
 I will check lipids. Apparently this has improved with his weight loss.

## 2023-10-20 NOTE — Progress Notes (Signed)
 [facility] [address] [place] [address] Dept: [phone] Dept Fax: [phone]  New Patient Office Visit  Subjective:    Patient ID: Cole Lozano, male    DOB: September 24, 1980, 43 y.o..   MRN: [id number]  Chief Complaint  Patient presents with   Establish Care    NP- establish care.    C/o having neck pain off/on.    History of Present Illness:  Patient is in today to establish care. Cole Lozano is a twin. They were born in [place], [place]. 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 He moved to [place] in 2008. He was married for 14 years, but is now divorced. He has a son (10). Cole Lozano denies tobacco or drug use. He drinsk 1-2 drinks per month.  Cole Lozano has mild, intermittent asthma. He uses albuterol  and a Trelegy inhaler as needed when he has an asthma flare.  Cole Lozano has a history of ADHD, inattentive type. He is managed on Adderall 20 mg bid.  Cole Lozano has had some hypertension in the past, but manages this with a daily aspirin and Vitamin D .  Cole Lozano has seasonal allergies. he is manage don both fexofenadine and cetirizine. He also takes montelukast  10 mg daily.  Cole Lozano notes he has had issues with testosterone  deficiency. He is managed on ashwagandha and another herb to naturally raise his testosterone .  Past Medical History: Patient Active Problem List   Diagnosis Date Noted   S/P UPPP (uvulopalatopharyngoplasty) 05/03/2021   Class 1 obesity due to excess calories without serious comorbidity with body mass index (BMI) of 33.0 to 33.9 in adult 07/10/2020   Hepatic steatosis with liver cyst 03/16/2020   Elevated LFTs 02/15/2020   Testosterone  deficiency 02/08/2018   Labile hypertension    Extrinsic asthma    Vitamin D  deficiency    ADD (attention deficit disorder)    Hyperlipidemia, mixed    Insulin   resistance    Past Surgical History:  Procedure Laterality Date   ELBOW SURGERY Left 04/25/1989   s/p trauma   FRACTURE SURGERY     Left elbow and right thumb both surgically repaired   HERNIA REPAIR Right 04/25/2001   inguinal   OTHER SURGICAL HISTORY Right    thumb   TONSILLECTOMY AND ADENOIDECTOMY     Family History  Problem Relation Age of Onset   Cancer Mother        skin   Cancer Father    Neurofibromatosis Brother    Asthma Brother    Diabetes Other    Stroke Other    Arthritis Maternal Grandmother    Cancer Paternal Grandfather    Outpatient Medications Prior to Visit  Medication Sig Dispense Refill   albuterol  (PROVENTIL  HFA;VENTOLIN  HFA) 108 (90 Base) MCG/ACT inhaler Inhale 2 puffs into the lungs every 6 (six) hours as needed for wheezing or shortness of breath. 18 g 3   amphetamine -dextroamphetamine  (ADDERALL) 20 MG tablet 1/2-1 tab 1 or 2 x /daily for ADD, should not take daily to avoid tolerance / addiction - script should last longer than 30 days 60 tablet 0   ASHWAGANDHA PO Take by mouth.     aspirin 81 MG tablet Take 81 mg by mouth daily.     Cholecalciferol (VITAMIN D  PO) Take 10,000 Units by mouth daily.      Fluticasone-Umeclidin-Vilant (TRELEGY ELLIPTA ) 200-62.5-25 MCG/ACT AEPB Use  1 Inhalation  Daily  during  Allergy Seasons. 60 each 11   montelukast  (SINGULAIR ) 10 MG tablet TAKE 1 TABLET BY MOUTH DAILY FOR ALLERGIES 90 tablet 3   multivitamin (ONE-A-DAY MEN'S) TABS tablet Take 1 tablet by mouth daily.     atorvastatin  (LIPITOR) 20 MG tablet Take  1 tablet  4 x /week   for Cholesterol (Patient not taking: Reported on 04/14/2023) 52 tablet 3   doxycycline  (VIBRAMYCIN ) 100 MG capsule Take 1 capsule twice daily with food 20 capsule 0   minoxidil (ROGAINE) 2 % external solution Apply topically 2 (two) times daily. (Patient not taking: Reported on 04/14/2023)     No facility-administered medications prior to visit.   No Known Allergies Objective:   Today's  Vitals   10/20/23 1311  BP: 122/78  Pulse: 67  Temp: (!) 97.3 F (36.3 C)  TempSrc: Temporal  SpO2: 98%  Weight: 198 lb 6.4 oz (90 kg)  Height: 5' 10 (1.778 m)   Body mass index is 28.47 kg/m.   General: Well developed, well nourished. No acute distress. Psych: Alert and oriented. Normal mood and affect.  Health Maintenance Due  Topic Date Due   Pneumococcal Vaccine 1-46 Years old (1 of 2 - PCV) Never done   Hepatitis B Vaccines (1 of 3 - 19+ 3-dose series) Never done   HPV VACCINES (1 - 3-dose SCDM series) Never done   DTaP/Tdap/Td (2 - Td or Tdap) 06/14/2023     Assessment & Plan:   Problem List Items Addressed This Visit       Cardiovascular and Mediastinum   Labile hypertension   Blood pressures are very normal at present. Mr. Riolo notes ~ 60 lbs of weight loss, which may have resolved this issue.        Respiratory   Mild intermittent asthma   Stable. Continue use of Trelegy and albuterol  as needed for flares.        Digestive   Hepatic steatosis with liver cyst - Primary   Has had weight loss. I will reassess his LFTs.      Relevant Orders   Comprehensive metabolic panel with GFR     Other   ADD (attention deficit disorder)   I will plan to continue Adderall 20 mg bid.      Hyperlipidemia, mixed   I will check lipids. Apparently this has improved with his weight loss.      Relevant Orders   Lipid panel   Comprehensive metabolic panel with GFR   Testosterone  deficiency   Plan to have him return for testosterone  testing.      Relevant Orders   Testosterone     Return for Annual preventative care.   Cole CHRISTELLA Simpler, MD

## 2023-10-20 NOTE — Assessment & Plan Note (Signed)
 Blood pressures are very normal at present. Mr. Cole Lozano notes ~ 60 lbs of weight loss, which may have resolved this issue.

## 2023-10-20 NOTE — Assessment & Plan Note (Signed)
 Plan to have him return for testosterone  testing.

## 2023-10-20 NOTE — Assessment & Plan Note (Addendum)
 Has had weight loss. I will reassess his LFTs.

## 2023-11-01 ENCOUNTER — Other Ambulatory Visit: Payer: Self-pay | Admitting: Family Medicine

## 2023-11-01 DIAGNOSIS — F988 Other specified behavioral and emotional disorders with onset usually occurring in childhood and adolescence: Secondary | ICD-10-CM

## 2023-11-01 NOTE — Telephone Encounter (Unsigned)
 Copied from CRM [id number]. Topic: Clinical - Medication Refill >> Nov 01, 2023  4:32 PM Viola F wrote: Medication: amphetamine -dextroamphetamine  (ADDERALL) 20 MG tablet [[id number]]  Has the patient contacted their pharmacy? Yes (Agent: If no, request that the patient contact the pharmacy for the refill. If patient does not wish to contact the pharmacy document the reason why and proceed with request.) (Agent: If yes, when and what did the pharmacy advise?)  This is the patient's preferred pharmacy:    [facility] #[id number] - [place], [place] - [address] [place] [address] Phone: [phone] Fax: [phone]  Is this the correct pharmacy for this prescription? Yes If no, delete pharmacy and type the correct one.   Has the prescription been filled recently? Yes  Is the patient out of the medication? Yes  Has the patient been seen for an appointment in the last year OR does the patient have an upcoming appointment? Yes  Can we respond through MyChart? Yes  Agent: Please be advised that Rx refills may take up to 3 business days. We ask that you follow-up with your pharmacy.

## 2023-11-02 MED ORDER — AMPHETAMINE-DEXTROAMPHETAMINE 20 MG PO TABS: ORAL_TABLET | ORAL | 0 refills | Status: DC

## 2023-11-02 NOTE — Telephone Encounter (Signed)
 Refill request for  Adderall 20 mg LR  10/03/23, #60, 0 rf LOV 10/20/23 FOV  02/23/24  Please review and advise. Dm/cma

## 2023-11-03 ENCOUNTER — Other Ambulatory Visit

## 2023-11-07 ENCOUNTER — Other Ambulatory Visit (INDEPENDENT_AMBULATORY_CARE_PROVIDER_SITE_OTHER)

## 2023-11-07 ENCOUNTER — Ambulatory Visit: Payer: Self-pay | Admitting: Family Medicine

## 2023-11-07 DIAGNOSIS — E349 Endocrine disorder, unspecified: Secondary | ICD-10-CM | POA: Diagnosis not present

## 2023-11-07 DIAGNOSIS — K760 Fatty (change of) liver, not elsewhere classified: Secondary | ICD-10-CM

## 2023-11-07 DIAGNOSIS — E782 Mixed hyperlipidemia: Secondary | ICD-10-CM | POA: Diagnosis not present

## 2023-11-07 LAB — COMPREHENSIVE METABOLIC PANEL WITH GFR
ALT: 24 U/L (ref 0–53)
AST: 25 U/L (ref 0–37)
Albumin: 4.8 g/dL (ref 3.5–5.2)
Alkaline Phosphatase: 53 U/L (ref 39–117)
BUN: 12 mg/dL (ref 6–23)
CO2: 29 meq/L (ref 19–32)
Calcium: 9.5 mg/dL (ref 8.4–10.5)
Chloride: 101 meq/L (ref 96–112)
Creatinine, Ser: 0.97 mg/dL (ref 0.40–1.50)
GFR: 96.92 mL/min (ref >60.00)
Glucose, Bld: 116 mg/dL — ABNORMAL HIGH (ref 70–99)
Potassium: 4.3 meq/L (ref 3.5–5.1)
Sodium: 137 meq/L (ref 135–145)
Total Bilirubin: 1.1 mg/dL (ref 0.2–1.2)
Total Protein: 7.4 g/dL (ref 6.0–8.3)

## 2023-11-07 LAB — LIPID PANEL
Cholesterol: 194 mg/dL (ref 0–200)
HDL: 42.6 mg/dL (ref >39.00)
LDL Cholesterol: 137 mg/dL — ABNORMAL HIGH (ref 0–99)
NonHDL: 151.69
Total CHOL/HDL Ratio: 5
Triglycerides: 73 mg/dL (ref 0.0–149.0)
VLDL: 14.6 mg/dL (ref 0.0–40.0)

## 2023-11-07 LAB — TESTOSTERONE: Testosterone: 303.36 ng/dL (ref 300.00–890.00)

## 2023-12-04 ENCOUNTER — Telehealth: Payer: Self-pay | Admitting: Family Medicine

## 2023-12-04 DIAGNOSIS — F988 Other specified behavioral and emotional disorders with onset usually occurring in childhood and adolescence: Secondary | ICD-10-CM

## 2023-12-04 NOTE — Telephone Encounter (Signed)
 Copied from CRM [id number]. Topic: Clinical - Medication Refill >> Dec 04, 2023  3:16 PM Viola F wrote: Medication: amphetamine-dextroamphetamine (ADDERALL) 20 MG tablet [[id number]]  Has the patient contacted their pharmacy? Yes (Agent: If no, request that the patient contact the pharmacy for the refill. If patient does not wish to contact the pharmacy document the reason why and proceed with request.) (Agent: If yes, when and what did the pharmacy advise?)  This is the patient's preferred pharmacy:  [facility] #[id number] - [place], [place] - [address] [place] [address] Phone: [phone] Fax: [phone]   Is this the correct pharmacy for this prescription? Yes If no, delete pharmacy and type the correct one.   Has the prescription been filled recently? Yes  Is the patient out of the medication? Yes  Has the patient been seen for an appointment in the last year OR does the patient have an upcoming appointment? Yes  Can we respond through MyChart? Yes  Agent: Please be advised that Rx refills may take up to 3 business days. We ask that you follow-up with your pharmacy.

## 2023-12-05 MED ORDER — AMPHETAMINE-DEXTROAMPHETAMINE 20 MG PO TABS: ORAL_TABLET | ORAL | 0 refills | Status: DC

## 2023-12-05 NOTE — Telephone Encounter (Signed)
 Requesting: amphetamine -dextroamphetamine  (ADDERALL) 20 MG tablet  Last Visit: 10/20/2023 Next Visit: 02/23/2024 Last Refill: 11/02/2023  Please Advise

## 2024-01-10 ENCOUNTER — Other Ambulatory Visit: Payer: Self-pay | Admitting: Family Medicine

## 2024-01-10 DIAGNOSIS — F988 Other specified behavioral and emotional disorders with onset usually occurring in childhood and adolescence: Secondary | ICD-10-CM

## 2024-01-10 MED ORDER — AMPHETAMINE-DEXTROAMPHETAMINE 20 MG PO TABS: ORAL_TABLET | ORAL | 0 refills | Status: DC

## 2024-01-10 NOTE — Telephone Encounter (Signed)
 Refill request for  Adderall 20 mg LR 12/05/23, #60, 0 rf LOV  10/20/23 FOV 02/23/24  Please review and advise.  Thanks. Dm/cma

## 2024-01-10 NOTE — Telephone Encounter (Unsigned)
 Copied from CRM #[id number]. Topic: Clinical - Medication Refill >> Jan 10, 2024 12:15 PM Chiquita SQUIBB wrote: Medication:  amphetamine -dextroamphetamine  Expired - amphetamine -dextroamphetamine  (ADDERALL) 20 MG tablet   Has the patient contacted their pharmacy? Yes (Agent: If no, request that the patient contact the pharmacy for the refill. If patient does not wish to contact the pharmacy document the reason why and proceed with request.) (Agent: If yes, when and what did the pharmacy advise?)  This is the patient's preferred pharmacy:  [id number] - [place], [place] - [address] [place] [address] Phone: [phone] Fax: [phone]   Is this the correct pharmacy for this prescription? Yes If no, delete pharmacy and type the correct one.   Has the prescription been filled recently? No  Is the patient out of the medication? Yes  Has the patient been seen for an appointment in the last year OR does the patient have an upcoming appointment? Yes  Can we respond through MyChart? Yes  Agent: Please be advised that Rx refills may take up to 3 business days. We ask that you follow-up with your pharmacy.

## 2024-01-16 ENCOUNTER — Telehealth: Payer: Self-pay

## 2024-01-16 DIAGNOSIS — F988 Other specified behavioral and emotional disorders with onset usually occurring in childhood and adolescence: Secondary | ICD-10-CM

## 2024-01-16 NOTE — Telephone Encounter (Signed)
 Copied from CRM [id number]. Topic: Clinical - Prescription Issue >> Jan 16, 2024 10:50 AM Henretta I wrote: Reason for CRM: Patients pharmacy doesn't have medication amphetamine -dextroamphetamine  (ADDERALL) 20 MG tablet in stock and they are not sure when they will get it back but they do have it in stock at  [facility] [address], [place], [place] [address] [phone] Can prescription be sent to this pharmacy instead as patient is completely out.

## 2024-01-18 NOTE — Addendum Note (Signed)
 Addended by: KYM KARNA CROME on: 01/18/2024 01:57 PM   Modules accepted: Orders

## 2024-01-18 NOTE — Telephone Encounter (Signed)
 [facility] didn't have the medication.  Please send to [facility].   Thanks. Dm/cma

## 2024-01-19 ENCOUNTER — Other Ambulatory Visit: Payer: Self-pay | Admitting: Family Medicine

## 2024-01-19 ENCOUNTER — Other Ambulatory Visit (HOSPITAL_BASED_OUTPATIENT_CLINIC_OR_DEPARTMENT_OTHER): Payer: Self-pay

## 2024-01-19 DIAGNOSIS — F988 Other specified behavioral and emotional disorders with onset usually occurring in childhood and adolescence: Secondary | ICD-10-CM

## 2024-01-19 MED ORDER — AMPHETAMINE-DEXTROAMPHETAMINE 20 MG PO TABS: 20.0000 mg | ORAL_TABLET | Freq: Two times a day (BID) | ORAL | 0 refills | Status: DC

## 2024-01-19 MED ORDER — AMPHETAMINE-DEXTROAMPHETAMINE 20 MG PO TABS: ORAL_TABLET | ORAL | 0 refills | Status: DC

## 2024-01-19 MED ORDER — AMPHETAMINE-DEXTROAMPHETAMINE 20 MG PO TABS
20.0000 mg | ORAL_TABLET | Freq: Two times a day (BID) | ORAL | 0 refills | Status: DC
  Filled 2024-01-19: qty 60, 30d supply, fill #0

## 2024-01-19 NOTE — Telephone Encounter (Signed)
 Rx needs to go to [facility]

## 2024-01-19 NOTE — Addendum Note (Signed)
 Addended by: KYM KARNA CROME on: 01/19/2024 09:40 AM   Modules accepted: Orders

## 2024-02-19 ENCOUNTER — Other Ambulatory Visit: Payer: Self-pay | Admitting: Family Medicine

## 2024-02-19 DIAGNOSIS — F988 Other specified behavioral and emotional disorders with onset usually occurring in childhood and adolescence: Secondary | ICD-10-CM

## 2024-02-19 NOTE — Telephone Encounter (Unsigned)
 Copied from CRM [id number]. Topic: Clinical - Medication Refill >> Feb 19, 2024 11:15 AM Victoria A wrote: Medication: amphetamine -dextroamphetamine  (ADDERALL) 20 MG tablet  Has the patient contacted their pharmacy? Yes (Agent: If no, request that the patient contact the pharmacy for the refill. If patient does not wish to contact the pharmacy document the reason why and proceed with request.) (Agent: If yes, when and what did the pharmacy advise?) Call Primary  This is the patient's preferred pharmacy:  [facility]  [address] [place], [place] [address] [phone] Is this the correct pharmacy for this prescription? Yes If no, delete pharmacy and type the correct one.   Has the prescription been filled recently? No  Is the patient out of the medication? Yes  Has the patient been seen for an appointment in the last year OR does the patient have an upcoming appointment? Yes  Can we respond through MyChart? Yes  Agent: Please be advised that Rx refills may take up to 3 business days. We ask that you follow-up with your pharmacy.

## 2024-02-19 NOTE — Telephone Encounter (Signed)
 Requesting:   amphetamine -dextroamphetamine  (ADDERALL) 20 MG tablet   Last Visit: 10/20/2023 Next Visit: 02/23/2024 Last Refill: 01/19/2024  Please Advise

## 2024-02-20 MED ORDER — AMPHETAMINE-DEXTROAMPHETAMINE 20 MG PO TABS: 20.0000 mg | ORAL_TABLET | Freq: Two times a day (BID) | ORAL | 0 refills | Status: DC

## 2024-02-23 ENCOUNTER — Ambulatory Visit: Payer: Self-pay | Admitting: Family Medicine

## 2024-02-23 ENCOUNTER — Encounter: Payer: Self-pay | Admitting: Family Medicine

## 2024-02-23 VITALS — BP 128/80 | HR 85 | Temp 98.6°F | Ht 70.0 in | Wt 199.0 lb

## 2024-02-23 DIAGNOSIS — F900 Attention-deficit hyperactivity disorder, predominantly inattentive type: Secondary | ICD-10-CM

## 2024-02-23 DIAGNOSIS — E349 Endocrine disorder, unspecified: Secondary | ICD-10-CM

## 2024-02-23 DIAGNOSIS — R0989 Other specified symptoms and signs involving the circulatory and respiratory systems: Secondary | ICD-10-CM | POA: Diagnosis not present

## 2024-02-23 DIAGNOSIS — E782 Mixed hyperlipidemia: Secondary | ICD-10-CM | POA: Diagnosis not present

## 2024-02-23 DIAGNOSIS — J4520 Mild intermittent asthma, uncomplicated: Secondary | ICD-10-CM

## 2024-02-23 DIAGNOSIS — K760 Fatty (change of) liver, not elsewhere classified: Secondary | ICD-10-CM

## 2024-02-23 NOTE — Assessment & Plan Note (Addendum)
 Total cholesterol was normal, but LDL cholesterol was borderline elevated. AHA/ACC cardiovascular risk score shows a 1.4 % (low) risk of a MI or stroke in the next 10 years. This has improved with his lifestyle approaches.

## 2024-02-23 NOTE — Assessment & Plan Note (Addendum)
 Stable. No sign of wheezing. Continue use of Trelegy and albuterol  as needed for flares.

## 2024-02-23 NOTE — Progress Notes (Signed)
 [facility] [address] [place] [address] Dept: [phone] Dept Fax: [phone]  Office Visit  Subjective:    Patient ID: Cole Lozano, male    DOB: [birth year], 43 y.o..   MRN: [id number]  Chief Complaint  Patient presents with   Follow-up    4 month f/u.  no concerns.     History of Present Illness:  Patient is in today for medical management of chronic issues.   Mr. Taul has mild, intermittent asthma. He uses albuterol  and a Trelegy inhaler as needed when he has an asthma flare. He has not been needing his albuterol  recently.   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 He is managed on Adderall 20 mg bid. He finds his focus is good. 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   Mr. Coin has had some hypertension in the past. This improved with weight loss and lifestyle approaches.   Mr. Winkleman has seasonal allergies. He is managed on both fexofenadine and cetirizine. He also takes montelukast  10 mg daily.   Mr. Catoe notes he has had issues with testosterone  deficiency. He is managed on ashwagandha and another herb to naturally raise his testosterone .  Past Medical History: Patient Active Problem List   Diagnosis Date Noted   Overweight (BMI 25.0-29.9) 07/10/2020   Hepatic steatosis with liver cyst 03/16/2020   Elevated LFTs 02/15/2020   Testosterone  deficiency 02/08/2018   Labile hypertension    Mild intermittent asthma    Vitamin D  deficiency    ADD (attention deficit disorder)    Hyperlipidemia, mixed    Insulin  resistance    Past Surgical History:  Procedure Laterality Date   INGUINAL HERNIA REPAIR Right 04/25/2001   inguinal   OPEN REDUCTION INTERNAL FIXATION (ORIF) METACARPAL Right    Thumb   ORIF HUMERUS FRACTURE Left    Left elbow   PALATOPLASTY     Uvelopalatopharyngoplasty   TONSILLECTOMY AND ADENOIDECTOMY     Family History   Problem Relation Age of Onset   Obesity Mother    Cancer Mother        skin   Cancer Father        Melanoma   Neurofibromatosis Brother    Asthma Brother    Heart disease Paternal Aunt    Cancer Paternal Aunt        Lung   Cancer Paternal Uncle        Melanoma   Arthritis Maternal Grandmother    Diabetes Maternal Grandfather    Cerebral aneurysm Maternal Grandfather    Cancer Paternal Grandfather        Melanoma   Diabetes Other    Stroke Other    Outpatient Medications Prior to Visit  Medication Sig Dispense Refill   albuterol  (PROVENTIL  HFA;VENTOLIN  HFA) 108 (90 Base) MCG/ACT inhaler Inhale 2 puffs into the lungs every 6 (six) hours as needed for wheezing or shortness of breath. 18 g 3   amphetamine -dextroamphetamine  (ADDERALL) 20 MG tablet Take 1 tablet (20 mg total) by mouth 2 (two) times daily. 60 tablet 0   ASHWAGANDHA PO Take by mouth.     aspirin 81 MG tablet Take 81 mg by mouth daily.     Cholecalciferol (VITAMIN D  PO) Take 10,000 Units by mouth daily.      Fluticasone-Umeclidin-Vilant (TRELEGY ELLIPTA ) 200-62.5-25 MCG/ACT AEPB Use  1 Inhalation  Daily  during Allergy Seasons. 60 each 11   montelukast  (SINGULAIR ) 10  MG tablet TAKE 1 TABLET BY MOUTH DAILY FOR ALLERGIES 90 tablet 3   multivitamin (ONE-A-DAY MEN'S) TABS tablet Take 1 tablet by mouth daily.     No facility-administered medications prior to visit.   No Known Allergies   Objective:   Today's Vitals   02/23/24 0916  BP: 128/80  Pulse: 85  Temp: 98.6 F (37 C)  TempSrc: Temporal  SpO2: 98%  Weight: 199 lb (90.3 kg)  Height: 5' 10 (1.778 m)   Body mass index is 28.55 kg/m.   General: Well developed, well nourished. No acute distress. Lungs: Clear to auscultation bilaterally. No wheezing, rales or rhonchi. Psych: Alert and oriented. Normal mood and affect.  Health Maintenance Due  Topic Date Due   Pneumococcal Vaccine (1 of 2 - PCV) Never done   Hepatitis B Vaccines 19-59 Average Risk (1 of  3 - 19+ 3-dose series) Never done   HPV VACCINES (1 - 3-dose SCDM series) Never done   COVID-19 Vaccine (3 - Pfizer risk series) 09/09/2019   DTaP/Tdap/Td (2 - Td or Tdap) 06/14/2023   Influenza Vaccine  11/24/2023   Lab Results    Latest Ref Rng & Units 11/07/2023    9:26 AM 04/14/2023    9:58 AM 10/07/2022    9:27 AM  CMP  Glucose 70 - 99 mg/dL 883  898  95   BUN 6 - 23 mg/dL 12  12  22    Creatinine 0.40 - 1.50 mg/dL 9.02  8.99  9.02   Sodium 135 - 145 mEq/L 137  144  143   Potassium 3.5 - 5.1 mEq/L 4.3  4.6  5.5   Chloride 96 - 112 mEq/L 101  105  103   CO2 19 - 32 mEq/L 29  28  30    Calcium  8.4 - 10.5 mg/dL 9.5  9.8  89.4   Total Protein 6.0 - 8.3 g/dL 7.4  7.4  7.9   Total Bilirubin 0.2 - 1.2 mg/dL 1.1  0.5  0.7   Alkaline Phos 39 - 117 U/L 53     AST 0 - 37 U/L 25  21  19    ALT 0 - 53 U/L 24  20  19     Last lipids Lab Results  Component Value Date   CHOL 194 11/07/2023   HDL 42.60 11/07/2023   LDLCALC 137 (H) 11/07/2023   TRIG 73.0 11/07/2023   CHOLHDL 5 11/07/2023   Lab Results  Component Value Date   TESTOSTERONE  303.36 11/07/2023     Assessment & Plan:   Problem List Items Addressed This Visit       Cardiovascular and Mediastinum   Labile hypertension   Blood pressures are very normal at present. Weight loss has normalized this.        Respiratory   Mild intermittent asthma   Stable. No sign of wheezing. Continue use of Trelegy and albuterol  as needed for flares.        Digestive   Hepatic steatosis with liver cyst   Recent LFTs are normal. Weight loss has had a positive impact.        Other   ADD (attention deficit disorder)   Stable. Continue Adderall 20 mg bid.      Hyperlipidemia, mixed   Total cholesterol was normal, but LDL cholesterol was borderline elevated. AHA/ACC cardiovascular risk score shows a 1.4 % (low) risk of a MI or stroke in the next 10 years. This has improved with his lifestyle approaches.  Testosterone   deficiency - Primary   Recent testosterone  level was on the low end of the normal range. 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         Return in about 4 months (around 06/22/2024) for Reassessment.   Cole CHRISTELLA Simpler, MD  I,Emily Lagle,acting as a scribe for Cole CHRISTELLA Simpler, MD.,have documented all relevant documentation on the behalf of Cole CHRISTELLA Simpler, MD.  I, Cole CHRISTELLA Simpler, MD, have reviewed all documentation for this visit. The documentation on 02/23/2024 for the exam, diagnosis, procedures, and orders are all accurate and complete.

## 2024-02-23 NOTE — Assessment & Plan Note (Addendum)
 Recent LFTs are normal. Weight loss has had a positive impact.

## 2024-02-23 NOTE — Assessment & Plan Note (Addendum)
 Blood pressures are very normal at present. Weight loss has normalized this.

## 2024-02-23 NOTE — Assessment & Plan Note (Addendum)
 Stable. Continue  Adderall 20 mg bid.

## 2024-02-23 NOTE — Assessment & Plan Note (Addendum)
 Recent testosterone  level was on the low end of the normal range. 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

## 2024-03-19 ENCOUNTER — Other Ambulatory Visit: Payer: Self-pay | Admitting: Family Medicine

## 2024-03-19 DIAGNOSIS — F988 Other specified behavioral and emotional disorders with onset usually occurring in childhood and adolescence: Secondary | ICD-10-CM

## 2024-03-19 NOTE — Telephone Encounter (Unsigned)
 Copied from CRM #[id number]. Topic: Clinical - Medication Refill >> Mar 19, 2024  3:12 PM Dedra B wrote: Medication:  amphetamine -dextroamphetamine  (ADDERALL) 20 MG tablet   Has the patient contacted their pharmacy? No, calls office for refills  This is the patient's preferred pharmacy:   [id number] - [place], [place] - [address] [place] [address] Phone: [phone] Fax: [phone]  Is this the correct pharmacy for this prescription? Yes  Has the prescription been filled recently? No  Is the patient out of the medication? No, will be out Friday  Has the patient been seen for an appointment in the last year OR does the patient have an upcoming appointment? Yes  Can we respond through MyChart? Yes  Agent: Please be advised that Rx refills may take up to 3 business days. We ask that you follow-up with your pharmacy.

## 2024-03-19 NOTE — Telephone Encounter (Signed)
 Requesting: amphetamine -dextroamphetamine  (ADDERALL) 20 MG tablet  Last Visit: 02/23/2024 Next Visit: 06/28/2024 Last Refill: 02/20/2024  Please Advise

## 2024-03-20 MED ORDER — AMPHETAMINE-DEXTROAMPHETAMINE 20 MG PO TABS: 20.0000 mg | ORAL_TABLET | Freq: Two times a day (BID) | ORAL | 0 refills | Status: DC

## 2024-04-23 ENCOUNTER — Other Ambulatory Visit: Payer: Self-pay | Admitting: Family Medicine

## 2024-04-23 DIAGNOSIS — F988 Other specified behavioral and emotional disorders with onset usually occurring in childhood and adolescence: Secondary | ICD-10-CM

## 2024-04-23 MED ORDER — AMPHETAMINE-DEXTROAMPHETAMINE 20 MG PO TABS: 20.0000 mg | ORAL_TABLET | Freq: Two times a day (BID) | ORAL | 0 refills | Status: DC

## 2024-04-23 NOTE — Telephone Encounter (Signed)
 Pt is requesting refill for amphetamine -dextroamphetamine  (ADDERALL) 20 MG tablet   LOV 02/23/24 FOV 06/28/2024 LRF 03/20/24

## 2024-04-23 NOTE — Telephone Encounter (Signed)
 Copied from CRM [id number]. Topic: Clinical - Medication Refill >> Apr 23, 2024  3:52 PM Robinson H wrote: Medication: amphetamine -dextroamphetamine  (ADDERALL) 20 MG tablet  Has the patient contacted their pharmacy? No, controlled substance (Agent: If no, request that the patient contact the pharmacy for the refill. If patient does not wish to contact the pharmacy document the reason why and proceed with request.) (Agent: If yes, when and what did the pharmacy advise?)  This is the patient's preferred pharmacy:    [facility] #[id number] - [place], [place] - [address] [place] [address] Phone: [phone] Fax: [phone]  Is this the correct pharmacy for this prescription? Yes If no, delete pharmacy and type the correct one.   Has the prescription been filled recently? No  Is the patient out of the medication? Yes  Has the patient been seen for an appointment in the last year OR does the patient have an upcoming appointment? Yes  Can we respond through MyChart? Yes  Agent: Please be advised that Rx refills may take up to 3 business days. We ask that you follow-up with your pharmacy.

## 2024-05-07 ENCOUNTER — Telehealth: Payer: Self-pay | Admitting: Family Medicine

## 2024-05-07 NOTE — Telephone Encounter (Signed)
 error

## 2024-05-21 ENCOUNTER — Other Ambulatory Visit (HOSPITAL_BASED_OUTPATIENT_CLINIC_OR_DEPARTMENT_OTHER): Payer: Self-pay

## 2024-05-21 ENCOUNTER — Telehealth: Payer: Self-pay | Admitting: Nurse Practitioner

## 2024-05-21 DIAGNOSIS — F988 Other specified behavioral and emotional disorders with onset usually occurring in childhood and adolescence: Secondary | ICD-10-CM

## 2024-05-21 MED ORDER — AMPHETAMINE-DEXTROAMPHETAMINE 20 MG PO TABS
20.0000 mg | ORAL_TABLET | Freq: Two times a day (BID) | ORAL | 0 refills | Status: AC
  Filled 2024-05-21: qty 60, 30d supply, fill #0

## 2024-05-21 NOTE — Addendum Note (Signed)
 Addended by: THEDORA GARNETTE HERO on: 05/21/2024 10:17 AM   Modules accepted: Orders

## 2024-05-21 NOTE — Telephone Encounter (Signed)
 Copied from CRM #[id number]. Topic: Clinical - Medication Refill >> May 20, 2024  5:01 PM Drema MATSU wrote: Medication: amphetamine -dextroamphetamine  (ADDERALL) 20 MG tablet  Has the patient contacted their pharmacy? Yes (Agent: If no, request that the patient contact the pharmacy for the refill. If patient does not wish to contact the pharmacy document the reason why and proceed with request.) always told to call dr (Agent: If yes, when and what did the pharmacy advise?)  This is the patient's preferred pharmacy:  [id number] - [place], [place] - [address] [place] [address] Phone: [phone] Fax: [phone]  Is this the correct pharmacy for this prescription? Yes If no, delete pharmacy and type the correct one.   Has the prescription been filled recently? Yes  Is the patient out of the medication? No 5 left  Has the patient been seen for an appointment in the last year OR does the patient have an upcoming appointment? Yes  Can we respond through MyChart? Yes  Agent: Please be advised that Rx refills may take up to 3 business days. We ask that you follow-up with your pharmacy.

## 2024-05-22 ENCOUNTER — Telehealth: Payer: Self-pay | Admitting: Family Medicine

## 2024-05-22 ENCOUNTER — Other Ambulatory Visit (HOSPITAL_BASED_OUTPATIENT_CLINIC_OR_DEPARTMENT_OTHER): Payer: Self-pay

## 2024-05-22 NOTE — Telephone Encounter (Signed)
 error

## 2024-06-28 ENCOUNTER — Ambulatory Visit: Admitting: Family Medicine
